# Patient Record
Sex: Male | Born: 1940 | Race: Black or African American | Hispanic: No | Marital: Married | Smoking: Never smoker
Health system: Southern US, Community
[De-identification: ages and names within clinical notes are randomized; demographics above are authoritative.]

## PROBLEM LIST (undated history)

## (undated) DIAGNOSIS — Z8709 Personal history of other diseases of the respiratory system: Secondary | ICD-10-CM

## (undated) DIAGNOSIS — I639 Cerebral infarction, unspecified: Secondary | ICD-10-CM

## (undated) DIAGNOSIS — I219 Acute myocardial infarction, unspecified: Secondary | ICD-10-CM

## (undated) DIAGNOSIS — K219 Gastro-esophageal reflux disease without esophagitis: Secondary | ICD-10-CM

## (undated) DIAGNOSIS — I10 Essential (primary) hypertension: Secondary | ICD-10-CM

## (undated) DIAGNOSIS — H269 Unspecified cataract: Secondary | ICD-10-CM

## (undated) DIAGNOSIS — E785 Hyperlipidemia, unspecified: Secondary | ICD-10-CM

## (undated) DIAGNOSIS — K649 Unspecified hemorrhoids: Secondary | ICD-10-CM

## (undated) DIAGNOSIS — M549 Dorsalgia, unspecified: Secondary | ICD-10-CM

## (undated) HISTORY — PX: SHOULDER SURGERY: SHX246

## (undated) HISTORY — PX: CARDIAC CATHETERIZATION: SHX172

## (undated) HISTORY — PX: COLONOSCOPY: SHX174

## (undated) HISTORY — PX: REFRACTIVE SURGERY: SHX103

## (undated) HISTORY — DX: Gastro-esophageal reflux disease without esophagitis: K21.9

---

## 1998-11-30 ENCOUNTER — Ambulatory Visit (HOSPITAL_COMMUNITY): Admission: RE | Admit: 1998-11-30 | Discharge: 1998-11-30 | Payer: Self-pay | Admitting: Orthopedic Surgery

## 1998-12-04 ENCOUNTER — Ambulatory Visit (HOSPITAL_COMMUNITY): Admission: RE | Admit: 1998-12-04 | Discharge: 1998-12-04 | Payer: Self-pay | Admitting: Orthopedic Surgery

## 1998-12-04 ENCOUNTER — Encounter: Payer: Self-pay | Admitting: Orthopedic Surgery

## 1999-01-21 ENCOUNTER — Ambulatory Visit (HOSPITAL_COMMUNITY): Admission: RE | Admit: 1999-01-21 | Discharge: 1999-01-21 | Payer: Self-pay | Admitting: Orthopedic Surgery

## 1999-01-21 ENCOUNTER — Encounter: Payer: Self-pay | Admitting: Orthopedic Surgery

## 1999-02-02 ENCOUNTER — Encounter: Admission: RE | Admit: 1999-02-02 | Discharge: 1999-05-03 | Payer: Self-pay | Admitting: Orthopedic Surgery

## 1999-07-23 ENCOUNTER — Inpatient Hospital Stay (HOSPITAL_COMMUNITY): Admission: EM | Admit: 1999-07-23 | Discharge: 1999-07-27 | Payer: Self-pay | Admitting: Emergency Medicine

## 1999-07-23 ENCOUNTER — Encounter: Payer: Self-pay | Admitting: Neurology

## 1999-07-24 ENCOUNTER — Encounter: Payer: Self-pay | Admitting: Neurology

## 1999-07-27 ENCOUNTER — Inpatient Hospital Stay (HOSPITAL_COMMUNITY)
Admission: RE | Admit: 1999-07-27 | Discharge: 1999-08-05 | Payer: Self-pay | Admitting: Physical Medicine & Rehabilitation

## 1999-08-10 ENCOUNTER — Encounter
Admission: RE | Admit: 1999-08-10 | Discharge: 1999-10-01 | Payer: Self-pay | Admitting: Physical Medicine & Rehabilitation

## 2000-02-10 ENCOUNTER — Encounter: Admission: RE | Admit: 2000-02-10 | Discharge: 2000-05-10 | Payer: Self-pay | Admitting: Orthopedic Surgery

## 2002-01-30 ENCOUNTER — Encounter: Payer: Self-pay | Admitting: Urology

## 2002-01-30 ENCOUNTER — Encounter: Admission: RE | Admit: 2002-01-30 | Discharge: 2002-01-30 | Payer: Self-pay | Admitting: Urology

## 2005-06-16 ENCOUNTER — Encounter: Admission: RE | Admit: 2005-06-16 | Discharge: 2005-06-16 | Payer: Self-pay | Admitting: Nephrology

## 2005-10-20 ENCOUNTER — Encounter: Admission: RE | Admit: 2005-10-20 | Discharge: 2005-10-20 | Payer: Self-pay | Admitting: Nephrology

## 2008-12-17 ENCOUNTER — Encounter (INDEPENDENT_AMBULATORY_CARE_PROVIDER_SITE_OTHER): Payer: Self-pay | Admitting: *Deleted

## 2009-02-19 ENCOUNTER — Ambulatory Visit: Payer: Self-pay | Admitting: Gastroenterology

## 2009-03-04 ENCOUNTER — Ambulatory Visit: Payer: Self-pay | Admitting: Gastroenterology

## 2009-04-15 ENCOUNTER — Encounter: Admission: RE | Admit: 2009-04-15 | Discharge: 2009-04-15 | Payer: Self-pay | Admitting: Nephrology

## 2010-09-24 LAB — GLUCOSE, CAPILLARY
Glucose-Capillary: 81 mg/dL (ref 70–99)
Glucose-Capillary: 98 mg/dL (ref 70–99)

## 2010-11-05 NOTE — Procedures (Signed)
[place]. [facility]  Patient:    Steve Martinez                           MRN: [id number] Proc. Date: 07/24/99 Adm. Date:  [id number] Attending:  Candise Che D                           Procedure Report  PROCEDURE:  Arterial line placement.  INDICATION:  Blood pressure monitoring.  Intracranial hemorrhage.  SITE:  Left radial artery.  TECHNIQUE:  Sterile modified Seldinger.  ANESTHESIA:  Lidocaine 1%.  COMPLICATIONS:  The patient tolerated the procedure well without any complications. DD:  07/24/99 TD:  07/25/99 Job: [id number]

## 2010-11-05 NOTE — Discharge Summary (Signed)
[place]. [facility]  Patient:    Steve Martinez                           MRN: [id number] Adm. Date:  [id number] Disc. Date: 07/27/99 Attending:  Blima Ledger CC:         Melburn Hake, M.D.                           Discharge Summary  DIAGNOSES: 1. Hypertensive intracranial hemorrhage right thalamus. 2. Hypertension. 3. Diabetes mellitus. 4. Coronary artery disease. 5. Ischemic stroke by history.  PROCEDURES: 1. CT scan of the head. 2. Arterial line placement.  HISTORY OF PRESENT ILLNESS:  The patient is a 70 year old man who was brought by EMS emergently to [facility] ER after sudden onset of developing the night of admission around 10:30 p.m. slurred speech, left-sided weakness involving the face, arm, and leg.  The patient was fully awake and oriented.  Denied any headache, nausea, vomiting.  His examination upon admission showed an awake and alert patient, oriented, with dysarthric speech, facial weakness on the left side, central type, and a left hemiparesis.  Strength 3/5.  The patient underwent an urgent CT scan of the head which showed a small 1.2-2.0 cm intracranial hemorrhage in the right thalamus, very likely hypertensive in nature.  HOSPITAL COURSE:  He was subsequently admitted to the neurology intensive care nit for further monitoring of neurologic status and also monitoring of his blood pressure.  For these purposes, an arterial line placement was performed to maintain systolic blood pressure less than 200.  Throughout his hospital stay, his blood  pressure ranged between [id number] systolic to the highest at [id number] systolic.  He was kept on his usual antihypertensive medications.  Other interventions included use of  Pepcid for GI protection and stocking compression devices for DVT prophylaxis.  During his hospital stay, he showed significant improvement in neurological status. His speech improved, he was able to swallow with  dysphagia III diet with chopped meats.  His strength in the left side of his body improved also to 4/5.  The patient was evaluated by speech therapy, physical therapy, and occupational therapy, and inpatient rehabilitation services was recommended.  DISCHARGE MEDICATIONS: 1. Cartia XT 300 q.d. 2. Glucotrol 10 mg b.i.d. 3. Ibuprofen 800 mg p.r.n. 4. ______ 400 mg twice a day. 5. Pravachol 10 mg once a day. 6. Prinzide 20/25 q.d. 7. Testred 1 capsule once a day. 8. Avandia 4 mg twice a day.  The patients aspirin was held, and will be restarted, hopefully, in two weeks after his onset of hemorrhage for stroke prevention. DD:  07/27/99 TD:  07/27/99 Job: [id number]

## 2010-11-05 NOTE — Discharge Summary (Signed)
[place]. [facility]  Patient:    Steve Martinez, Steve Martinez                          MRN: [id number] Dictator:   Candise Che, M.D.                           Discharge Summary  CHIEF COMPLAINT:  Left-sided weakness.  HISTORY OF PRESENT ILLNESS:  The patient is a 70 year old man who was emergently brought by EMS after sudden onset of developing at 10:30 p.m. per his wife while he was in bed left-sided weakness involving the face, arms, and legs associated with numbness of the face and left upper extremity as well.  He denies headache, no nausea and vomiting, no loss of consciousness.  He had prior history of at least two strokes, last six years ago, which left him with a mild right hemiparesis although this has completely resolved at this time.  He is currently taking aspirin for secondary stroke prevention.  PAST MEDICAL HISTORY:  Hypertension, non-insulin dependent diabetes, stroke, coronary artery disease status post angioplasty and stenting.  MEDICATIONS: 1.  Aspirin 325 mg q.d. 2.  Cartia XT 300 mg q.d. 3.  Glucotrol 10 mg b.i.d. 4.  Ibuprofen 800 mg p.r.n. 5.  Etodolac 400 mls b.i.d. 6.  Pravachol 10 mg q.d. 7.  Prinzide 20/25 q.d. 8.  Testred 1 capsule q.d. 9.  Avandia 4 mg b.i.d.  ALLERGIES:  No known drug allergies.  PRIMARY CARE PHYSICIAN:  Melburn Hake, M.D. [phone]).  SOCIAL HISTORY:  Lives at home with wife.  Nonsmoker, nondrinker.  He works Designer, television/film set for [employer].  REVIEW OF SYSTEMS:  Per History of Present Illness.  He denies headaches, no visual changes, no chest pain or shortness of breath, no fever, no abdominal pain.  PHYSICAL EXAMINATION:  VITAL SIGNS:  Blood pressure on admission 149/76, pulse 76, respirations 16, afebrile.  GENERAL:  The patient is lying on a stretcher, in no distress.  HEENT:  Head normocephalic, atraumatic.  NECK:  Supple.  No bruits.  LUNGS:  Clear bilaterally.  HEART:  Heart sounds regular  rhythm without murmurs.  ABDOMEN:  Soft, bowel sounds present, no hepatosplenomegaly.  EXTREMITIES:  No cyanosis or edema.  NEUROLOGIC:  He is awake and alert.  Speech is dysarthric.  His memory and language are normal.  Pupils equal and reactive bilaterally.  Extraocular cephalic movements intact.  Face asymmetric with left central facial palsy.  Tongue midline. Palate elevates symmetrically.  Motor examination displays a left hemiparesis.  Strength 3 to 4/5 involving equally the arm and the leg.  DTRs depressed throughout.  Plantars downgoing. Coordination impaired in left upper and left lower extremity on finger-to-nose and heel-to-shin maneuvers.  Gait was not evaluated at this time.  NEURO IMAGING:  I have personally reviewed CT scan of the patients brain, which  shows a small 1.5 to 2 cm right thalamic hemorrhage right next to the posterior  limb of the capsule without mass effect or edema.  No hydrocephalus.  IMPRESSION: 1.  Intracranial hemorrhage, right thalamus, likely hypertensive in origin. 2.  Hypertension. 3.  Diabetes mellitus. 4.  Coronary artery disease. 5.  Ischemic stroke by history.  PLAN:  Plan, recommendations, diagnosis, condition, and further interventions were discussed at length with the patient and his wife at the bedside.  The patient ill be admitted to the neuro intensive care unit  for close monitoring of his neurologic status and monitoring of blood pressure as well.  For these purposes an arterial line will be placed to monitor blood pressure directly and accurately and treated as indicated to maintain systolic blood pressure less than 200.  Will start the  patient on Pepcid for GI protection and keep him NPO until he is evaluated by the speech therapist.  Would expect that the patient will need physical therapy, occupational therapy, and eventually rehabilitation as well.  Further hemodynamic support will be provided with IV fluids,  normal saline 100 ccs an hour.  Lower extremity stocking compression devices will be used for deep vein thrombosis prophylaxis.  Dr. Grant Fontana was notified of the patients admission.DD:  07/24/99 TD:  07/24/99 Job: [id number]

## 2012-01-30 ENCOUNTER — Other Ambulatory Visit: Payer: Self-pay | Admitting: Ophthalmology

## 2012-01-30 NOTE — H&P (Signed)
  Pre-operative History and Physical for Ophthalmic Surgery  Steve Martinez 01/30/2012                  Chief Complaint: Decreased vision Right Eye  Diagnosis: Cataract Right Eye No Known Allergies  Medications: ASA 81mg  po QD Triamterene/HCTZ  37.7/25 po QD Quinapril  5mg  po QD Diltiazem HCL 240mg  po QD Simvastatin 10mg  po QD Lantus 100U/ml  15U QAM, 20 U QPM  Planned Procedure:                                       Phacoemulsification, Posterior Chamber Intra-ocular Lens  Right Eye                                      Acrysof MA50BM + 20.00 Diopter PC IOL of rimplant OD   Pulse: 70         Temp: NA        Resp:  16     ROS: blurred vision, low back ache  DM for several years  History   Social History  . Marital Status: Married    Spouse Name: N/A    Number of Children: N/A  . Years of Education: N/A   Occupational History  . Not on file.   Social History Main Topics  . Smoking status: Not on file  . Smokeless tobacco: Not on file  . Alcohol Use: Not on file  . Drug Use: Not on file  . Sexually Active: Not on file   Other Topics Concern  . Not on file   Social History Narrative  . No narrative on file     The following examination is for anesthesia clearance for minimally invasive Ophthalmic surgery. It is primarily to document heart and lung findings and is not intended to elucidate unknown general medical conditions inclusive of abdominal masses, lung lesions, etc.   General Constitution:  within normal limit   Alertness/Orientation:  Person, time place     yes   HEENT:  Eye Findings: Catraact right eye                   right eye  Neck: supple without masses  Chest/Lungs: clear to auscultation  Cardiac: Normal S1 and S2 without Murmur, S3 or S4  Neuro: non-focal   Impression: Nuclear Sclerotic Cataract  Planned Procedure:  Phacoemulsification, Posterior Chamber Intraocular Lens  Right Steve Fairly, MD

## 2012-01-31 ENCOUNTER — Encounter (HOSPITAL_COMMUNITY): Payer: Self-pay | Admitting: Pharmacy Technician

## 2012-02-03 ENCOUNTER — Encounter (HOSPITAL_COMMUNITY)
Admission: RE | Admit: 2012-02-03 | Discharge: 2012-02-03 | Disposition: A | Discharge status: Home / Self Care | Payer: No Typology Code available for payment source | Source: Ambulatory Visit | Attending: Ophthalmology | Admitting: Ophthalmology

## 2012-02-03 ENCOUNTER — Encounter (HOSPITAL_COMMUNITY): Payer: Self-pay

## 2012-02-03 HISTORY — DX: Hyperlipidemia, unspecified: E78.5

## 2012-02-03 HISTORY — DX: Personal history of other diseases of the respiratory system: Z87.09

## 2012-02-03 HISTORY — DX: Unspecified hemorrhoids: K64.9

## 2012-02-03 HISTORY — DX: Cerebral infarction, unspecified: I63.9

## 2012-02-03 HISTORY — DX: Dorsalgia, unspecified: M54.9

## 2012-02-03 HISTORY — DX: Essential (primary) hypertension: I10

## 2012-02-03 HISTORY — DX: Unspecified cataract: H26.9

## 2012-02-03 HISTORY — DX: Acute myocardial infarction, unspecified: I21.9

## 2012-02-03 LAB — BASIC METABOLIC PANEL
BUN: 30 mg/dL — ABNORMAL HIGH (ref 6–23)
CO2: 26 mEq/L (ref 19–32)
Chloride: 101 mEq/L (ref 96–112)
GFR calc Af Amer: 43 mL/min — ABNORMAL LOW (ref >90)
Glucose, Bld: 211 mg/dL — ABNORMAL HIGH (ref 70–99)
Potassium: 4.7 mEq/L (ref 3.5–5.1)

## 2012-02-03 LAB — CBC
HCT: 48 % (ref 39.0–52.0)
Hemoglobin: 15.7 g/dL (ref 13.0–17.0)
MCHC: 32.7 g/dL (ref 30.0–36.0)
MCV: 84.1 fL (ref 78.0–100.0)

## 2012-02-03 NOTE — Progress Notes (Signed)
Average fasting blood sugar around 90-95

## 2012-02-03 NOTE — Progress Notes (Addendum)
Pt doesn't have a cardiologist  Heart cath done 17-9yrs ago Echo and stress test done about 17-35yrs ago  Medical Md is Dr.Frazier  Denies recent cxr and ekg a few months ago-to request from [place]

## 2012-02-03 NOTE — Pre-Procedure Instructions (Signed)
[place]  02/03/2012   Your procedure is scheduled on:  Wed, Aug 21 @ 9:55 AM  Report to [place] at 7:45 AM.  Call this number if you have problems the morning of surgery: [phone]   Remember:   Do not eat food:After Midnight.    Take these medicines the morning of surgery with A SIP OF WATER: Diltiazem(Dilacor)   Do not wear jewelry  Do not wear lotions, powders, or colognes  Men may shave face and neck.  Do not bring valuables to the hospital.  Contacts, dentures or bridgework may not be worn into surgery.  Leave suitcase in the car. After surgery it may be brought to your room.  For patients admitted to the hospital, checkout time is 11:00 AM the day of discharge.   Patients discharged the day of surgery will not be allowed to drive home.  Special Instructions: CHG Shower Use Special Wash: 1/2 bottle night before surgery and 1/2 bottle morning of surgery.   Please read over the following fact sheets that you were given: Pain Booklet, Coughing and Deep Breathing and Surgical Site Infection Prevention

## 2012-02-06 NOTE — Consult Note (Signed)
Anesthesiology chart review:  Mr. Steve Martinez is a 71 year old male who is having placement of a right intraocular lens and phacoemulsification of the posterior chamber by Dr. Anderson Malta on 02/08/2012. His preoperative lab studies were notable for a creatinine of 1.75. He has a history of diabetes and hypertension and had a previous mild cerebral hemorrhage in the past. There is no baseline studies to compare to determine the nature of the procedure of note is acceptable to proceed with the procedure as planned. Followup his renal function postoperatively.  Roberts Gaudy, M.D.

## 2012-02-06 NOTE — Progress Notes (Signed)
Anesthesia to review labs.

## 2012-02-07 MED ORDER — TETRACAINE HCL 0.5 % OP SOLN
2.0000 [drp] | OPHTHALMIC | Status: AC
  Administered 2012-02-08: 2 [drp] via OPHTHALMIC
  Filled 2012-02-07: qty 2

## 2012-02-07 MED ORDER — PREDNISOLONE ACETATE 1 % OP SUSP
1.0000 [drp] | OPHTHALMIC | Status: AC
  Administered 2012-02-08: 1 [drp] via OPHTHALMIC
  Filled 2012-02-07 (×2): qty 5

## 2012-02-07 MED ORDER — GATIFLOXACIN 0.5 % OP SOLN
1.0000 [drp] | OPHTHALMIC | Status: AC | PRN
  Administered 2012-02-08 (×3): 1 [drp] via OPHTHALMIC
  Filled 2012-02-07: qty 2.5

## 2012-02-07 MED ORDER — PHENYLEPHRINE HCL 2.5 % OP SOLN
1.0000 [drp] | OPHTHALMIC | Status: AC | PRN
  Administered 2012-02-08 (×3): 1 [drp] via OPHTHALMIC
  Filled 2012-02-07: qty 3

## 2012-02-07 NOTE — Progress Notes (Signed)
Spoke to pt, notified of surgery time change and new arrival time of 44.

## 2012-02-08 ENCOUNTER — Ambulatory Visit (HOSPITAL_COMMUNITY): Payer: No Typology Code available for payment source | Admitting: Anesthesiology

## 2012-02-08 ENCOUNTER — Ambulatory Visit (HOSPITAL_COMMUNITY)
Admission: RE | Admit: 2012-02-08 | Discharge: 2012-02-08 | Disposition: A | Discharge status: Home / Self Care | Payer: No Typology Code available for payment source | Source: Ambulatory Visit | Attending: Ophthalmology | Admitting: Ophthalmology

## 2012-02-08 ENCOUNTER — Encounter (HOSPITAL_COMMUNITY): Payer: Self-pay | Admitting: Anesthesiology

## 2012-02-08 ENCOUNTER — Encounter (HOSPITAL_COMMUNITY)
Admission: RE | Disposition: A | Discharge status: Home / Self Care | Payer: Self-pay | Source: Ambulatory Visit | Attending: Ophthalmology

## 2012-02-08 DIAGNOSIS — I10 Essential (primary) hypertension: Secondary | ICD-10-CM | POA: Insufficient documentation

## 2012-02-08 DIAGNOSIS — H269 Unspecified cataract: Secondary | ICD-10-CM | POA: Insufficient documentation

## 2012-02-08 DIAGNOSIS — Z794 Long term (current) use of insulin: Secondary | ICD-10-CM | POA: Insufficient documentation

## 2012-02-08 DIAGNOSIS — I6990 Unspecified sequelae of unspecified cerebrovascular disease: Secondary | ICD-10-CM | POA: Insufficient documentation

## 2012-02-08 DIAGNOSIS — E119 Type 2 diabetes mellitus without complications: Secondary | ICD-10-CM | POA: Insufficient documentation

## 2012-02-08 HISTORY — PX: CATARACT EXTRACTION W/PHACO: SHX586

## 2012-02-08 LAB — GLUCOSE, CAPILLARY: Glucose-Capillary: 78 mg/dL (ref 70–99)

## 2012-02-08 SURGERY — PHACOEMULSIFICATION, CATARACT, WITH IOL INSERTION
Anesthesia: Monitor Anesthesia Care | Site: Eye | Laterality: Right | Wound class: Clean

## 2012-02-08 MED ORDER — CEFAZOLIN SUBCONJUNCTIVAL INJECTION 100 MG/0.5 ML
200.0000 mg | INJECTION | SUBCONJUNCTIVAL | Status: DC
  Filled 2012-02-08: qty 1

## 2012-02-08 MED ORDER — LIDOCAINE HCL (PF) 2 % IJ SOLN
INTRAMUSCULAR | Status: DC | PRN
  Administered 2012-02-08: 20 mL

## 2012-02-08 MED ORDER — BACITRACIN-POLYMYXIN B 500-10000 UNIT/GM OP OINT
TOPICAL_OINTMENT | OPHTHALMIC | Status: DC | PRN
  Administered 2012-02-08: 1 via OPHTHALMIC

## 2012-02-08 MED ORDER — PROVISC 10 MG/ML IO SOLN
INTRAOCULAR | Status: DC | PRN
  Administered 2012-02-08: .85 mL via INTRAOCULAR

## 2012-02-08 MED ORDER — WATER FOR IRRIGATION, STERILE IR SOLN
Status: DC | PRN
  Administered 2012-02-08: 1000 mL via OPHTHALMIC

## 2012-02-08 MED ORDER — ACETYLCHOLINE CHLORIDE 1:100 IO SOLR
INTRAOCULAR | Status: DC | PRN
  Administered 2012-02-08: 20 mg via INTRAOCULAR

## 2012-02-08 MED ORDER — DEXAMETHASONE SODIUM PHOSPHATE 10 MG/ML IJ SOLN
INTRAMUSCULAR | Status: AC
  Filled 2012-02-08: qty 1

## 2012-02-08 MED ORDER — EPINEPHRINE HCL 1 MG/ML IJ SOLN
INTRAOCULAR | Status: DC | PRN
  Administered 2012-02-08: 10:00:00

## 2012-02-08 MED ORDER — BUPIVACAINE HCL 0.75 % IJ SOLN
INTRAMUSCULAR | Status: DC | PRN
  Administered 2012-02-08: 10 mL

## 2012-02-08 MED ORDER — SODIUM CHLORIDE 0.9 % IV SOLN
INTRAVENOUS | Status: DC
  Administered 2012-02-08: 09:00:00 via INTRAVENOUS

## 2012-02-08 MED ORDER — NA CHONDROIT SULF-NA HYALURON 40-30 MG/ML IO SOLN
INTRAOCULAR | Status: DC | PRN
  Administered 2012-02-08: 0.5 mL via INTRAOCULAR

## 2012-02-08 MED ORDER — LIDOCAINE HCL 2 % IJ SOLN
INTRAMUSCULAR | Status: AC
Start: 2012-02-08 — End: 2012-02-08
  Filled 2012-02-08: qty 1

## 2012-02-08 MED ORDER — EPINEPHRINE HCL 1 MG/ML IJ SOLN
INTRAMUSCULAR | Status: AC
Start: 2012-02-08 — End: 2012-02-08
  Filled 2012-02-08: qty 1

## 2012-02-08 MED ORDER — BUPIVACAINE HCL 0.75 % IJ SOLN
INTRAMUSCULAR | Status: AC
  Filled 2012-02-08: qty 10

## 2012-02-08 MED ORDER — 0.9 % SODIUM CHLORIDE (POUR BTL) OPTIME
TOPICAL | Status: DC | PRN
  Administered 2012-02-08: 1000 mL

## 2012-02-08 MED ORDER — ACETYLCHOLINE CHLORIDE 1:100 IO SOLR
INTRAOCULAR | Status: AC
  Filled 2012-02-08: qty 1

## 2012-02-08 MED ORDER — NA CHONDROIT SULF-NA HYALURON 40-30 MG/ML IO SOLN
INTRAOCULAR | Status: AC
  Filled 2012-02-08: qty 0.5

## 2012-02-08 MED ORDER — BACITRACIN-POLYMYXIN B 500-10000 UNIT/GM OP OINT
TOPICAL_OINTMENT | OPHTHALMIC | Status: AC
  Filled 2012-02-08: qty 3.5

## 2012-02-08 MED ORDER — HYPROMELLOSE (GONIOSCOPIC) 2.5 % OP SOLN
OPHTHALMIC | Status: AC
  Filled 2012-02-08: qty 15

## 2012-02-08 MED ORDER — HYPROMELLOSE (GONIOSCOPIC) 2.5 % OP SOLN
OPHTHALMIC | Status: DC | PRN
  Administered 2012-02-08: 2 [drp] via OPHTHALMIC

## 2012-02-08 MED ORDER — BSS IO SOLN
INTRAOCULAR | Status: AC
  Filled 2012-02-08: qty 500

## 2012-02-08 MED ORDER — SODIUM CHLORIDE 0.9 % IV SOLN
INTRAVENOUS | Status: DC | PRN
  Administered 2012-02-08: 10:00:00 via INTRAVENOUS

## 2012-02-08 MED ORDER — DEXAMETHASONE SODIUM PHOSPHATE 10 MG/ML IJ SOLN
INTRAMUSCULAR | Status: DC | PRN
  Administered 2012-02-08: 10 mg via INTRAVENOUS

## 2012-02-08 MED ORDER — PROPOFOL 10 MG/ML IV EMUL
INTRAVENOUS | Status: DC | PRN
  Administered 2012-02-08: 60 mg via INTRAVENOUS

## 2012-02-08 MED ORDER — LIDOCAINE HCL (CARDIAC) 20 MG/ML IV SOLN
INTRAVENOUS | Status: DC | PRN
  Administered 2012-02-08: 40 mg via INTRAVENOUS

## 2012-02-08 MED ORDER — TRIAMCINOLONE ACETONIDE 40 MG/ML IJ SUSP
INTRAMUSCULAR | Status: AC
  Filled 2012-02-08: qty 1

## 2012-02-08 MED ORDER — ACETAZOLAMIDE SODIUM 500 MG IJ SOLR
INTRAMUSCULAR | Status: AC
  Filled 2012-02-08: qty 500

## 2012-02-08 MED ORDER — CEFAZOLIN SUBCONJUNCTIVAL INJECTION 100 MG/0.5 ML
INJECTION | SUBCONJUNCTIVAL | Status: DC | PRN
  Administered 2012-02-08: 200 mg via SUBCONJUNCTIVAL

## 2012-02-08 SURGICAL SUPPLY — 68 items
APL SRG 3 HI ABS STRL LF PLS (MISCELLANEOUS) ×1
APPLICATOR COTTON TIP 6IN STRL (MISCELLANEOUS) ×2 IMPLANT
APPLICATOR DR MATTHEWS STRL (MISCELLANEOUS) ×2 IMPLANT
BAG FLD CLT MN 6.25X3.5 (WOUND CARE) ×1
BAG MINI COLL DRAIN (WOUND CARE) ×2 IMPLANT
BLADE EYE MINI 60D BEAVER (BLADE) IMPLANT
BLADE KERATOME 2.75 (BLADE) ×2 IMPLANT
BLADE STAB KNIFE 15DEG (BLADE) IMPLANT
CANNULA ANTERIOR CHAMBER 27GA (MISCELLANEOUS) IMPLANT
CLOTH BEACON ORANGE TIMEOUT ST (SAFETY) ×2 IMPLANT
DRAPE OPHTHALMIC 77X100 STRL (CUSTOM PROCEDURE TRAY) ×2 IMPLANT
DRAPE POUCH INSTRU U-SHP 10X18 (DRAPES) ×2 IMPLANT
DRSG TEGADERM 4X4.75 (GAUZE/BANDAGES/DRESSINGS) ×2 IMPLANT
FILTER BLUE MILLIPORE (MISCELLANEOUS) IMPLANT
GLOVE BIOGEL PI IND STRL 7.0 (GLOVE) IMPLANT
GLOVE BIOGEL PI INDICATOR 7.0 (GLOVE) ×1
GLOVE SS BIOGEL STRL SZ 6.5 (GLOVE) ×1 IMPLANT
GLOVE SS BIOGEL STRL SZ 7 (GLOVE) IMPLANT
GLOVE SUPERSENSE BIOGEL SZ 6.5 (GLOVE) ×1
GLOVE SUPERSENSE BIOGEL SZ 7 (GLOVE) ×2
GLOVE SURG SS PI 6.5 STRL IVOR (GLOVE) ×1 IMPLANT
GOWN SRG XL XLNG 56XLVL 4 (GOWN DISPOSABLE) ×1 IMPLANT
GOWN STRL NON-REIN LRG LVL3 (GOWN DISPOSABLE) ×3 IMPLANT
GOWN STRL NON-REIN XL XLG LVL4 (GOWN DISPOSABLE) ×2
KIT BASIN OR (CUSTOM PROCEDURE TRAY) ×2 IMPLANT
KIT ROOM TURNOVER OR (KITS) IMPLANT
KNIFE GRIESHABER SHARP 2.5MM (MISCELLANEOUS) ×2 IMPLANT
LENS INTRAOCULAR MA50BM 20.0 (Intraocular Lens) ×1 IMPLANT
MASK EYE SHIELD (GAUZE/BANDAGES/DRESSINGS) ×1 IMPLANT
NDL 18GX1X1/2 (RX/OR ONLY) (NEEDLE) IMPLANT
NDL 25GX 5/8IN NON SAFETY (NEEDLE) ×1 IMPLANT
NDL FILTER BLUNT 18X1 1/2 (NEEDLE) IMPLANT
NDL HYPO 30X.5 LL (NEEDLE) ×2 IMPLANT
NEEDLE 18GX1X1/2 (RX/OR ONLY) (NEEDLE) ×2 IMPLANT
NEEDLE 22X1 1/2 (OR ONLY) (NEEDLE) ×2 IMPLANT
NEEDLE 25GX 5/8IN NON SAFETY (NEEDLE) ×2 IMPLANT
NEEDLE FILTER BLUNT 18X 1/2SAF (NEEDLE)
NEEDLE FILTER BLUNT 18X1 1/2 (NEEDLE) IMPLANT
NEEDLE HYPO 30X.5 LL (NEEDLE) ×4 IMPLANT
NS IRRIG 1000ML POUR BTL (IV SOLUTION) ×2 IMPLANT
PACK CATARACT CUSTOM (CUSTOM PROCEDURE TRAY) ×2 IMPLANT
PACK CATARACT MCHSCP (PACKS) ×2 IMPLANT
PACK COMBINED CATERACT/VIT 23G (OPHTHALMIC RELATED) IMPLANT
PAD ARMBOARD 7.5X6 YLW CONV (MISCELLANEOUS) ×4 IMPLANT
PAD EYE OVAL STERILE LF (GAUZE/BANDAGES/DRESSINGS) ×1 IMPLANT
PHACO TIP KELMAN 45DEG (TIP) ×2 IMPLANT
PROBE ANTERIOR 20G W/INFUS NDL (MISCELLANEOUS) ×1 IMPLANT
ROLLS DENTAL (MISCELLANEOUS) IMPLANT
SHUTTLE MONARCH TYPE A (NEEDLE) ×2 IMPLANT
SOLUTION ANTI FOG 6CC (MISCELLANEOUS) ×1 IMPLANT
SPEAR EYE SURG WECK-CEL (MISCELLANEOUS) ×2 IMPLANT
SUT ETHILON 10-0 CS-B-6CS-B-6 (SUTURE)
SUT ETHILON 5 0 P 3 18 (SUTURE)
SUT ETHILON 9 0 TG140 8 (SUTURE) IMPLANT
SUT NYLON ETHILON 5-0 P-3 1X18 (SUTURE) IMPLANT
SUT PLAIN 6 0 TG1408 (SUTURE) IMPLANT
SUT POLY NON ABSORB 10-0 8 STR (SUTURE) IMPLANT
SUT VICRYL 6 0 S 29 12 (SUTURE) IMPLANT
SUTURE EHLN 10-0 CS-B-6CS-B-6 (SUTURE) IMPLANT
SYR 20CC LL (SYRINGE) IMPLANT
SYR 5ML LL (SYRINGE) IMPLANT
SYR TB 1ML LUER SLIP (SYRINGE) ×1 IMPLANT
SYRINGE 10CC LL (SYRINGE) IMPLANT
TAPE SURG TRANSPORE 1 IN (GAUZE/BANDAGES/DRESSINGS) IMPLANT
TAPE SURGICAL TRANSPORE 1 IN (GAUZE/BANDAGES/DRESSINGS) ×1
TOWEL OR 17X24 6PK STRL BLUE (TOWEL DISPOSABLE) ×4 IMPLANT
WATER STERILE IRR 1000ML POUR (IV SOLUTION) ×2 IMPLANT
WIPE INSTRUMENT VISIWIPE 73X73 (MISCELLANEOUS) ×2 IMPLANT

## 2012-02-08 NOTE — Anesthesia Preprocedure Evaluation (Addendum)
Anesthesia Evaluation  Patient identified by MRN, date of birth, ID band Patient awake    Reviewed: Allergy & Precautions, H&P , NPO status , Patient's Chart, lab work & pertinent test results  History of Anesthesia Complications Negative for: history of anesthetic complications  Airway Mallampati: II TM Distance: >3 FB Neck ROM: Full    Dental  (+) Partial Lower, Teeth Intact, Caps and Dental Advisory Given,    Pulmonary neg pulmonary ROS,          Cardiovascular hypertension, Pt. on medications + Past MI     Neuro/Psych CVA (L sided weakness), Residual Symptoms    GI/Hepatic negative GI ROS, Neg liver ROS,   Endo/Other  Well Controlled, Type 2, Insulin Dependent  Renal/GU negative Renal ROS     Musculoskeletal negative musculoskeletal ROS (+)   Abdominal   Peds  Hematology negative hematology ROS (+)   Anesthesia Other Findings   Reproductive/Obstetrics                        Anesthesia Physical Anesthesia Plan  ASA: III  Anesthesia Plan:    Post-op Pain Management:    Induction:   Airway Management Planned:   Additional Equipment:   Intra-op Plan:   Post-operative Plan:   Informed Consent:   Plan Discussed with:   Anesthesia Plan Comments:         Anesthesia Quick Evaluation

## 2012-02-08 NOTE — H&P (View-Only) (Signed)
  Pre-operative History and Physical for Ophthalmic Surgery  Steve Martinez 01/30/2012                  Chief Complaint: Decreased vision Right Eye  Diagnosis: Cataract Right Eye No Known Allergies  Medications: ASA 81mg  po QD Triamterene/HCTZ  37.7/25 po QD Quinapril  5mg  po QD Diltiazem HCL 240mg  po QD Simvastatin 10mg  po QD Lantus 100U/ml  15U QAM, 20 U QPM  Planned Procedure:                                       Phacoemulsification, Posterior Chamber Intra-ocular Lens  Right Eye                                      Acrysof MA50BM + 20.00 Diopter PC IOL of rimplant OD   Pulse: 70         Temp: NA        Resp:  16     ROS: blurred vision, low back ache  DM for several years  History   Social History  . Marital Status: Married    Spouse Name: N/A    Number of Children: N/A  . Years of Education: N/A   Occupational History  . Not on file.   Social History Main Topics  . Smoking status: Not on file  . Smokeless tobacco: Not on file  . Alcohol Use: Not on file  . Drug Use: Not on file  . Sexually Active: Not on file   Other Topics Concern  . Not on file   Social History Narrative  . No narrative on file     The following examination is for anesthesia clearance for minimally invasive Ophthalmic surgery. It is primarily to document heart and lung findings and is not intended to elucidate unknown general medical conditions inclusive of abdominal masses, lung lesions, etc.   General Constitution:  within normal limit   Alertness/Orientation:  Person, time place     yes   HEENT:  Eye Findings: Catraact right eye                   right eye  Neck: supple without masses  Chest/Lungs: clear to auscultation  Cardiac: Normal S1 and S2 without Murmur, S3 or S4  Neuro: non-focal   Impression: Nuclear Sclerotic Cataract  Planned Procedure:  Phacoemulsification, Posterior Chamber Intraocular Lens  Right [name], MD

## 2012-02-08 NOTE — Transfer of Care (Signed)
Immediate Anesthesia Transfer of Care Note  Patient: Steve Martinez  Procedure(s) Performed: Procedure(s) (LRB): CATARACT EXTRACTION PHACO AND INTRAOCULAR LENS PLACEMENT (IOC) (Right)  Patient Location: Short Stay  Anesthesia Type: MAC  Level of Consciousness: awake, alert , oriented and patient cooperative  Airway & Oxygen Therapy: Patient Spontanous Breathing  Post-op Assessment: Report given to PACU RN, Post -op Vital signs reviewed and stable and Patient moving all extremities  Post vital signs: Reviewed and stable  Complications: No apparent anesthesia complications

## 2012-02-08 NOTE — Op Note (Signed)
Steve Martinez 02/08/2012 Cataract   Procedure: Phacoemulsification, Posterior Chamber Intra-ocular Lens Operative Eye:  left eye  Surgeon: Adonis Brook Estimated Blood Loss: minimal Specimens for Pathology:  None Complications: Posterior capsule tear  The patient was prepared and draped in the usual manner for ocular surgery on the right eye. A Cook lid speculum was placed. A peripheral clear corneal incision was made at the surgical limbus centered at the 11:00 meridian. A separate clear corneal stab incision was made with a 15 degree blade at the 2:00 meridian to permit bi-manual technique. Provisc was instilled into the anterior chamber through that incision.  A keratome was used to create a self sealing incision entering the anterior chamber at the 11:00 meridian. A capsulorhexis was performed using a bent 25g needle. The lens was hydrodissected and the nucleus was hydrodilineated using a Nichammin cannula. The Chang chopper was inserted and used to rotate the lens to insure adequate lens mobility. The phacoemulsification handpiece was inserted and a combined phaco-chop technique was employed, fracturing the lens into separate sections with subsequent removal with the phaco handpiece. At the beginning of cortex removal a radial tear formed at the 4:30 meridian and progressed to the posterior capsule. The anterior vitrectomy handpiece was used to remove core vitreous and to reduce posterior capsule which still had adherent cortex. After clearing the anterior chamber of vitreous and removing as much posterior capsule as was feasible safely, Provisc was placed in the anterior chamber and the PC IOL was placed in the sulcus oriented vertically. The vitrector was again used to insure there was no vitreous in the [facility]. Miochol was then placed to bring the pupil down. The Monarch injector was used to place a folded Acrysof MA50BM PC IOL, + 20.00  diopters, into the capsule bag. A McPherson forcep was used to  place the trailing haptic in the sulcus.  The I/A cannula was used to remove the viscoelastic from the anterior chamber. BSS was used to bring IOP to the desired range and the wound was checked to insure it was watertight. Subconjunctival injections of Ancef 100/0.81ml and Dexamethasone 4mg /6ml were placed without complication. The lid speculum and drapes were removed and the patient's eye was patched with Polymixin/Bacitracin ophthalmic ointment. An eye shield was placed and the patient was transferred alert and conversant from the operating room to the post-operative recovery area.   Steve Martinez, Steve Martinez

## 2012-02-08 NOTE — Anesthesia Postprocedure Evaluation (Signed)
Anesthesia Post Note  Patient: Steve Martinez  Procedure(s) Performed: Procedure(s) (LRB): CATARACT EXTRACTION PHACO AND INTRAOCULAR LENS PLACEMENT (IOC) (Right)  Anesthesia type: MAC  Patient location: PACU  Post pain: Pain level controlled  Post assessment: Patient's Cardiovascular Status Stable  Last Vitals:  Filed Vitals:   02/08/12 1123  BP: 153/73  Pulse: 68  Temp: 36.6 C  Resp: 16    Post vital signs: Reviewed and stable  Level of consciousness: sedated  Complications: No apparent anesthesia complications

## 2012-02-08 NOTE — Progress Notes (Signed)
CBG noted at 78. Patient drinking regular coke and ate crackers and peanut butter prior to leaving post op. Patient has been asymptomatic of any hypoglycemia. Postop teaching included monitoring blood sugar and eating balanced diet today.

## 2012-02-08 NOTE — Addendum Note (Signed)
Addendum  created 02/08/12 1207 by Noah Charon, CRNA   Modules edited:Anesthesia Medication Administration

## 2012-02-08 NOTE — Interval H&P Note (Signed)
History and Physical Interval Note:  02/08/2012 9:33 AM  Steve Martinez  has presented today for surgery, with the diagnosis of Cataract Right Eye  The various methods of treatment have been discussed with the patient and family. After consideration of risks, benefits and other options for treatment, the patient has consented to  Procedure(s) (LRB): CATARACT EXTRACTION PHACO AND INTRAOCULAR LENS PLACEMENT ([place]) (Right) as a surgical intervention .  The patient's history has been reviewed, patient examined, no change in status, stable for surgery.  I have reviewed the patient's chart and labs.  Questions were answered to the patient's satisfaction.     Adonis Brook, MD

## 2012-02-09 ENCOUNTER — Encounter (HOSPITAL_COMMUNITY): Payer: Self-pay | Admitting: Ophthalmology

## 2014-06-23 DIAGNOSIS — E11351 Type 2 diabetes mellitus with proliferative diabetic retinopathy with macular edema: Secondary | ICD-10-CM | POA: Diagnosis not present

## 2014-06-23 DIAGNOSIS — E11339 Type 2 diabetes mellitus with moderate nonproliferative diabetic retinopathy without macular edema: Secondary | ICD-10-CM | POA: Diagnosis not present

## 2014-06-23 DIAGNOSIS — H43813 Vitreous degeneration, bilateral: Secondary | ICD-10-CM | POA: Diagnosis not present

## 2014-07-16 DIAGNOSIS — E11329 Type 2 diabetes mellitus with mild nonproliferative diabetic retinopathy without macular edema: Secondary | ICD-10-CM | POA: Diagnosis not present

## 2014-07-16 DIAGNOSIS — N183 Chronic kidney disease, stage 3 (moderate): Secondary | ICD-10-CM | POA: Diagnosis not present

## 2014-07-16 DIAGNOSIS — I639 Cerebral infarction, unspecified: Secondary | ICD-10-CM | POA: Diagnosis not present

## 2014-07-16 DIAGNOSIS — E780 Pure hypercholesterolemia: Secondary | ICD-10-CM | POA: Diagnosis not present

## 2014-07-16 DIAGNOSIS — E1142 Type 2 diabetes mellitus with diabetic polyneuropathy: Secondary | ICD-10-CM | POA: Diagnosis not present

## 2014-07-16 DIAGNOSIS — E1122 Type 2 diabetes mellitus with diabetic chronic kidney disease: Secondary | ICD-10-CM | POA: Diagnosis not present

## 2014-07-16 DIAGNOSIS — I129 Hypertensive chronic kidney disease with stage 1 through stage 4 chronic kidney disease, or unspecified chronic kidney disease: Secondary | ICD-10-CM | POA: Diagnosis not present

## 2014-08-06 DIAGNOSIS — H43811 Vitreous degeneration, right eye: Secondary | ICD-10-CM | POA: Diagnosis not present

## 2014-08-06 DIAGNOSIS — E11351 Type 2 diabetes mellitus with proliferative diabetic retinopathy with macular edema: Secondary | ICD-10-CM | POA: Diagnosis not present

## 2014-08-06 DIAGNOSIS — E11339 Type 2 diabetes mellitus with moderate nonproliferative diabetic retinopathy without macular edema: Secondary | ICD-10-CM | POA: Diagnosis not present

## 2014-08-12 DIAGNOSIS — Z794 Long term (current) use of insulin: Secondary | ICD-10-CM | POA: Diagnosis not present

## 2014-08-12 DIAGNOSIS — E1142 Type 2 diabetes mellitus with diabetic polyneuropathy: Secondary | ICD-10-CM | POA: Diagnosis not present

## 2014-08-12 DIAGNOSIS — E11329 Type 2 diabetes mellitus with mild nonproliferative diabetic retinopathy without macular edema: Secondary | ICD-10-CM | POA: Diagnosis not present

## 2014-08-12 DIAGNOSIS — E1122 Type 2 diabetes mellitus with diabetic chronic kidney disease: Secondary | ICD-10-CM | POA: Diagnosis not present

## 2014-08-12 DIAGNOSIS — N183 Chronic kidney disease, stage 3 (moderate): Secondary | ICD-10-CM | POA: Diagnosis not present

## 2014-08-20 DIAGNOSIS — E11351 Type 2 diabetes mellitus with proliferative diabetic retinopathy with macular edema: Secondary | ICD-10-CM | POA: Diagnosis not present

## 2014-11-11 DIAGNOSIS — E1122 Type 2 diabetes mellitus with diabetic chronic kidney disease: Secondary | ICD-10-CM | POA: Diagnosis not present

## 2014-11-11 DIAGNOSIS — N183 Chronic kidney disease, stage 3 (moderate): Secondary | ICD-10-CM | POA: Diagnosis not present

## 2014-11-11 DIAGNOSIS — E11329 Type 2 diabetes mellitus with mild nonproliferative diabetic retinopathy without macular edema: Secondary | ICD-10-CM | POA: Diagnosis not present

## 2014-11-11 DIAGNOSIS — Z794 Long term (current) use of insulin: Secondary | ICD-10-CM | POA: Diagnosis not present

## 2014-11-11 DIAGNOSIS — E1142 Type 2 diabetes mellitus with diabetic polyneuropathy: Secondary | ICD-10-CM | POA: Diagnosis not present

## 2014-12-17 DIAGNOSIS — L02619 Cutaneous abscess of unspecified foot: Secondary | ICD-10-CM | POA: Diagnosis not present

## 2014-12-19 ENCOUNTER — Encounter: Payer: Self-pay | Admitting: Podiatry

## 2014-12-19 ENCOUNTER — Ambulatory Visit (INDEPENDENT_AMBULATORY_CARE_PROVIDER_SITE_OTHER): Payer: Commercial Managed Care - HMO | Admitting: Podiatry

## 2014-12-19 ENCOUNTER — Ambulatory Visit (INDEPENDENT_AMBULATORY_CARE_PROVIDER_SITE_OTHER): Payer: Commercial Managed Care - HMO

## 2014-12-19 ENCOUNTER — Ambulatory Visit: Payer: Commercial Managed Care - HMO

## 2014-12-19 VITALS — BP 126/71 | HR 84 | Temp 98.0°F | Resp 18

## 2014-12-19 DIAGNOSIS — L97511 Non-pressure chronic ulcer of other part of right foot limited to breakdown of skin: Secondary | ICD-10-CM

## 2014-12-19 DIAGNOSIS — Z189 Retained foreign body fragments, unspecified material: Secondary | ICD-10-CM

## 2014-12-19 DIAGNOSIS — R52 Pain, unspecified: Secondary | ICD-10-CM | POA: Diagnosis not present

## 2014-12-19 NOTE — Patient Instructions (Signed)
Finish antibiotics  Monitor for any signs/symptoms of infection. Call the office immediately if any occur or go directly to the emergency room. Call with any questions/concerns.

## 2014-12-19 NOTE — Progress Notes (Signed)
   Subjective:    Patient ID: Steve Martinez, male    DOB: 09-01-1940, 74 y.o.   MRN: [id number]  HPI  74 year old male presents the office today with complaints of pain to the ball of his right foot. He states his been ongoing for approximately 3 weeks has been progressive. He states he has pain to the area between with pressure in shoe gear. He does also states that he dropped a coffee jar and he may have stepped on glass although he is unsure. Over the ball the right foot there is a callus he states that he has had some pus coming from the area. He did go to his primary care physician and his placed on Bactrim. Denies any swelling or redness to the area. No other complaints at this time. He is diabetic and since his last blood sugar was 131 today.  Review of Systems  All other systems reviewed and are negative.      Objective:   Physical Exam AAO 3, NAD DP/PT pulses decreased bilaterally, CRT less than 3 seconds Protective sensation decreased Simms Weinstein monofilament, Achilles tendon reflex intact On the plantar aspect of the right foot submetatarsal 5 hyperkeratotic amputation. Upon debridement lesion there was a piece of glass which was removed. Also upon debridement there is underlying ulceration measuring about 0.5 x 0.5 cm with a granular wound base. There wound does not probe and there is no undermining or tunneling. There is a no surrounding erythema or ascending cellulitis. No purulence is expressible this time. No other open lesions or pre-ulcer lesions identified bilaterally No pain with calf compression, swelling, warmth, erythema      Assessment & Plan:  74 year old male right foot ulceration, retained foreign body -X-rays were obtained and reviewed with the patient. There was a foreign body present submetatarsal 5 area. X-rays were also taken after the debridement and the glass removal to ensure that the foreign body removed which had been. -Treatment options discussed  including all alternatives, risks, and complications -Lesion sharply debrided. A piece of glass was removed. Underlying ulceration was debrided to healthy, bleeding, granular wound base. Silvadene was applied followed by dry sterile dressing. Continue daily dressing changes at home with and a lot of ointment and a bandage. Finish course of antibiotic. Monitor for any clinical signs or symptoms of infection and directed to call the office immediately should any occur or go to the ER. -Follow-up 2 weeks or sooner if any problems arise. In the meantime, encouraged to call the office with any questions, concerns, change in symptoms.   Celesta Gentile, DPM

## 2014-12-29 ENCOUNTER — Encounter: Payer: Self-pay | Admitting: Podiatry

## 2014-12-31 DIAGNOSIS — E11339 Type 2 diabetes mellitus with moderate nonproliferative diabetic retinopathy without macular edema: Secondary | ICD-10-CM | POA: Diagnosis not present

## 2014-12-31 DIAGNOSIS — H43813 Vitreous degeneration, bilateral: Secondary | ICD-10-CM | POA: Diagnosis not present

## 2014-12-31 DIAGNOSIS — E11351 Type 2 diabetes mellitus with proliferative diabetic retinopathy with macular edema: Secondary | ICD-10-CM | POA: Diagnosis not present

## 2015-01-01 ENCOUNTER — Encounter: Payer: Self-pay | Admitting: Gastroenterology

## 2015-01-02 ENCOUNTER — Ambulatory Visit (INDEPENDENT_AMBULATORY_CARE_PROVIDER_SITE_OTHER): Payer: Commercial Managed Care - HMO | Admitting: Podiatry

## 2015-01-02 ENCOUNTER — Encounter: Payer: Self-pay | Admitting: Podiatry

## 2015-01-02 VITALS — BP 135/72 | HR 79 | Resp 18

## 2015-01-02 DIAGNOSIS — Z189 Retained foreign body fragments, unspecified material: Secondary | ICD-10-CM | POA: Diagnosis not present

## 2015-01-02 DIAGNOSIS — L97511 Non-pressure chronic ulcer of other part of right foot limited to breakdown of skin: Secondary | ICD-10-CM | POA: Diagnosis not present

## 2015-01-05 ENCOUNTER — Encounter: Payer: Self-pay | Admitting: Podiatry

## 2015-01-05 NOTE — Progress Notes (Signed)
Patient ID: Steve Martinez, male   DOB: [birth year], 74 y.o.   MRN: [id number]  Subjective: 74 year old male presents the office they follow up evaluation of right foot ulceration, foreign body removal. He states the last appointment he has been doing well and he no longer has any pain to the part of his foot. He denies any surrounding redness or drainage. Has continue on the antibiotic's. He denies any systemic complaints as fevers, chills, nausea, vomiting.  Objective: AAO x3, NAD Neurovascular status unchanged. Hyperkeratotic lesion overlying the right submetatarsal 5. Upon debridement the underlying ulceration appears to be healed. There is no opening in the skin. There is no surrounding erythema, ascending cellulitis, fluctuance, crepitus, malodor. There is no drainage or purulence. No other open lesions or pre-ulcerative lesions identified bilaterally. There is no pain with calf compression, swelling, warmth, erythema.  Assessment: 74 year old male with healed right submetatarsal 5 ulceration  Plan: Hyperkeratotic lesion sharply debrided without consultation/lesion. Underlying ulceration appears to be healed. Continue to monitor for any reoccurrence. Follow-up as needed. The meantime I encouraged him to call the office with any questions, concerns, changes symptoms.  Celesta Gentile, DPM

## 2015-01-14 DIAGNOSIS — E1142 Type 2 diabetes mellitus with diabetic polyneuropathy: Secondary | ICD-10-CM | POA: Diagnosis not present

## 2015-01-14 DIAGNOSIS — E780 Pure hypercholesterolemia: Secondary | ICD-10-CM | POA: Diagnosis not present

## 2015-01-14 DIAGNOSIS — N183 Chronic kidney disease, stage 3 (moderate): Secondary | ICD-10-CM | POA: Diagnosis not present

## 2015-01-14 DIAGNOSIS — E11329 Type 2 diabetes mellitus with mild nonproliferative diabetic retinopathy without macular edema: Secondary | ICD-10-CM | POA: Diagnosis not present

## 2015-01-14 DIAGNOSIS — I129 Hypertensive chronic kidney disease with stage 1 through stage 4 chronic kidney disease, or unspecified chronic kidney disease: Secondary | ICD-10-CM | POA: Diagnosis not present

## 2015-01-14 DIAGNOSIS — E1122 Type 2 diabetes mellitus with diabetic chronic kidney disease: Secondary | ICD-10-CM | POA: Diagnosis not present

## 2015-01-14 DIAGNOSIS — I639 Cerebral infarction, unspecified: Secondary | ICD-10-CM | POA: Diagnosis not present

## 2015-05-06 DIAGNOSIS — E113511 Type 2 diabetes mellitus with proliferative diabetic retinopathy with macular edema, right eye: Secondary | ICD-10-CM | POA: Diagnosis not present

## 2015-05-06 DIAGNOSIS — E113392 Type 2 diabetes mellitus with moderate nonproliferative diabetic retinopathy without macular edema, left eye: Secondary | ICD-10-CM | POA: Diagnosis not present

## 2015-05-06 DIAGNOSIS — H43813 Vitreous degeneration, bilateral: Secondary | ICD-10-CM | POA: Diagnosis not present

## 2015-05-27 DIAGNOSIS — E113299 Type 2 diabetes mellitus with mild nonproliferative diabetic retinopathy without macular edema, unspecified eye: Secondary | ICD-10-CM | POA: Diagnosis not present

## 2015-05-27 DIAGNOSIS — I10 Essential (primary) hypertension: Secondary | ICD-10-CM | POA: Diagnosis not present

## 2015-05-27 DIAGNOSIS — E1122 Type 2 diabetes mellitus with diabetic chronic kidney disease: Secondary | ICD-10-CM | POA: Diagnosis not present

## 2015-05-27 DIAGNOSIS — Z7984 Long term (current) use of oral hypoglycemic drugs: Secondary | ICD-10-CM | POA: Diagnosis not present

## 2015-05-27 DIAGNOSIS — E1142 Type 2 diabetes mellitus with diabetic polyneuropathy: Secondary | ICD-10-CM | POA: Diagnosis not present

## 2015-05-27 DIAGNOSIS — Z794 Long term (current) use of insulin: Secondary | ICD-10-CM | POA: Diagnosis not present

## 2015-05-27 DIAGNOSIS — N183 Chronic kidney disease, stage 3 (moderate): Secondary | ICD-10-CM | POA: Diagnosis not present

## 2015-07-17 DIAGNOSIS — I129 Hypertensive chronic kidney disease with stage 1 through stage 4 chronic kidney disease, or unspecified chronic kidney disease: Secondary | ICD-10-CM | POA: Diagnosis not present

## 2015-07-17 DIAGNOSIS — E1142 Type 2 diabetes mellitus with diabetic polyneuropathy: Secondary | ICD-10-CM | POA: Diagnosis not present

## 2015-07-17 DIAGNOSIS — Z794 Long term (current) use of insulin: Secondary | ICD-10-CM | POA: Diagnosis not present

## 2015-07-17 DIAGNOSIS — N183 Chronic kidney disease, stage 3 (moderate): Secondary | ICD-10-CM | POA: Diagnosis not present

## 2015-07-17 DIAGNOSIS — I639 Cerebral infarction, unspecified: Secondary | ICD-10-CM | POA: Diagnosis not present

## 2015-07-17 DIAGNOSIS — E7800 Pure hypercholesterolemia, unspecified: Secondary | ICD-10-CM | POA: Diagnosis not present

## 2015-07-17 DIAGNOSIS — E113292 Type 2 diabetes mellitus with mild nonproliferative diabetic retinopathy without macular edema, left eye: Secondary | ICD-10-CM | POA: Diagnosis not present

## 2015-07-17 DIAGNOSIS — E1122 Type 2 diabetes mellitus with diabetic chronic kidney disease: Secondary | ICD-10-CM | POA: Diagnosis not present

## 2015-07-21 DIAGNOSIS — E113511 Type 2 diabetes mellitus with proliferative diabetic retinopathy with macular edema, right eye: Secondary | ICD-10-CM | POA: Diagnosis not present

## 2015-07-21 DIAGNOSIS — H2512 Age-related nuclear cataract, left eye: Secondary | ICD-10-CM | POA: Diagnosis not present

## 2015-07-21 DIAGNOSIS — H02839 Dermatochalasis of unspecified eye, unspecified eyelid: Secondary | ICD-10-CM | POA: Diagnosis not present

## 2015-07-21 DIAGNOSIS — E113392 Type 2 diabetes mellitus with moderate nonproliferative diabetic retinopathy without macular edema, left eye: Secondary | ICD-10-CM | POA: Diagnosis not present

## 2015-07-21 DIAGNOSIS — Z961 Presence of intraocular lens: Secondary | ICD-10-CM | POA: Diagnosis not present

## 2015-07-21 DIAGNOSIS — E11319 Type 2 diabetes mellitus with unspecified diabetic retinopathy without macular edema: Secondary | ICD-10-CM | POA: Diagnosis not present

## 2015-07-21 DIAGNOSIS — I10 Essential (primary) hypertension: Secondary | ICD-10-CM | POA: Diagnosis not present

## 2015-09-02 DIAGNOSIS — E113392 Type 2 diabetes mellitus with moderate nonproliferative diabetic retinopathy without macular edema, left eye: Secondary | ICD-10-CM | POA: Diagnosis not present

## 2015-09-02 DIAGNOSIS — E113591 Type 2 diabetes mellitus with proliferative diabetic retinopathy without macular edema, right eye: Secondary | ICD-10-CM | POA: Diagnosis not present

## 2015-09-02 DIAGNOSIS — H43813 Vitreous degeneration, bilateral: Secondary | ICD-10-CM | POA: Diagnosis not present

## 2015-09-14 DIAGNOSIS — H2512 Age-related nuclear cataract, left eye: Secondary | ICD-10-CM | POA: Diagnosis not present

## 2015-09-14 DIAGNOSIS — H25812 Combined forms of age-related cataract, left eye: Secondary | ICD-10-CM | POA: Diagnosis not present

## 2015-09-16 DIAGNOSIS — N183 Chronic kidney disease, stage 3 (moderate): Secondary | ICD-10-CM | POA: Diagnosis not present

## 2015-09-16 DIAGNOSIS — E7800 Pure hypercholesterolemia, unspecified: Secondary | ICD-10-CM | POA: Diagnosis not present

## 2015-12-04 DIAGNOSIS — E1142 Type 2 diabetes mellitus with diabetic polyneuropathy: Secondary | ICD-10-CM | POA: Diagnosis not present

## 2015-12-04 DIAGNOSIS — E113299 Type 2 diabetes mellitus with mild nonproliferative diabetic retinopathy without macular edema, unspecified eye: Secondary | ICD-10-CM | POA: Diagnosis not present

## 2015-12-04 DIAGNOSIS — E1122 Type 2 diabetes mellitus with diabetic chronic kidney disease: Secondary | ICD-10-CM | POA: Diagnosis not present

## 2015-12-04 DIAGNOSIS — I10 Essential (primary) hypertension: Secondary | ICD-10-CM | POA: Diagnosis not present

## 2015-12-04 DIAGNOSIS — N183 Chronic kidney disease, stage 3 (moderate): Secondary | ICD-10-CM | POA: Diagnosis not present

## 2015-12-04 DIAGNOSIS — Z794 Long term (current) use of insulin: Secondary | ICD-10-CM | POA: Diagnosis not present

## 2016-01-15 DIAGNOSIS — E1142 Type 2 diabetes mellitus with diabetic polyneuropathy: Secondary | ICD-10-CM | POA: Diagnosis not present

## 2016-01-15 DIAGNOSIS — I639 Cerebral infarction, unspecified: Secondary | ICD-10-CM | POA: Diagnosis not present

## 2016-01-15 DIAGNOSIS — H3500 Unspecified background retinopathy: Secondary | ICD-10-CM | POA: Diagnosis not present

## 2016-01-15 DIAGNOSIS — N184 Chronic kidney disease, stage 4 (severe): Secondary | ICD-10-CM | POA: Diagnosis not present

## 2016-01-15 DIAGNOSIS — E7800 Pure hypercholesterolemia, unspecified: Secondary | ICD-10-CM | POA: Diagnosis not present

## 2016-01-15 DIAGNOSIS — I129 Hypertensive chronic kidney disease with stage 1 through stage 4 chronic kidney disease, or unspecified chronic kidney disease: Secondary | ICD-10-CM | POA: Diagnosis not present

## 2016-03-09 DIAGNOSIS — H3582 Retinal ischemia: Secondary | ICD-10-CM | POA: Diagnosis not present

## 2016-03-09 DIAGNOSIS — H43813 Vitreous degeneration, bilateral: Secondary | ICD-10-CM | POA: Diagnosis not present

## 2016-03-09 DIAGNOSIS — E113492 Type 2 diabetes mellitus with severe nonproliferative diabetic retinopathy without macular edema, left eye: Secondary | ICD-10-CM | POA: Diagnosis not present

## 2016-03-09 DIAGNOSIS — E113591 Type 2 diabetes mellitus with proliferative diabetic retinopathy without macular edema, right eye: Secondary | ICD-10-CM | POA: Diagnosis not present

## 2016-03-14 DIAGNOSIS — I129 Hypertensive chronic kidney disease with stage 1 through stage 4 chronic kidney disease, or unspecified chronic kidney disease: Secondary | ICD-10-CM | POA: Diagnosis not present

## 2016-03-14 DIAGNOSIS — I10 Essential (primary) hypertension: Secondary | ICD-10-CM | POA: Diagnosis not present

## 2016-03-14 DIAGNOSIS — N183 Chronic kidney disease, stage 3 (moderate): Secondary | ICD-10-CM | POA: Diagnosis not present

## 2016-03-14 DIAGNOSIS — E1122 Type 2 diabetes mellitus with diabetic chronic kidney disease: Secondary | ICD-10-CM | POA: Diagnosis not present

## 2016-03-14 DIAGNOSIS — Z23 Encounter for immunization: Secondary | ICD-10-CM | POA: Diagnosis not present

## 2016-03-14 DIAGNOSIS — E113299 Type 2 diabetes mellitus with mild nonproliferative diabetic retinopathy without macular edema, unspecified eye: Secondary | ICD-10-CM | POA: Diagnosis not present

## 2016-03-14 DIAGNOSIS — Z794 Long term (current) use of insulin: Secondary | ICD-10-CM | POA: Diagnosis not present

## 2016-03-14 DIAGNOSIS — E7800 Pure hypercholesterolemia, unspecified: Secondary | ICD-10-CM | POA: Diagnosis not present

## 2016-03-14 DIAGNOSIS — E1142 Type 2 diabetes mellitus with diabetic polyneuropathy: Secondary | ICD-10-CM | POA: Diagnosis not present

## 2016-04-06 DIAGNOSIS — N183 Chronic kidney disease, stage 3 (moderate): Secondary | ICD-10-CM | POA: Diagnosis not present

## 2016-04-06 DIAGNOSIS — I129 Hypertensive chronic kidney disease with stage 1 through stage 4 chronic kidney disease, or unspecified chronic kidney disease: Secondary | ICD-10-CM | POA: Diagnosis not present

## 2016-04-06 DIAGNOSIS — E1129 Type 2 diabetes mellitus with other diabetic kidney complication: Secondary | ICD-10-CM | POA: Diagnosis not present

## 2016-04-06 DIAGNOSIS — N184 Chronic kidney disease, stage 4 (severe): Secondary | ICD-10-CM | POA: Diagnosis not present

## 2016-04-06 DIAGNOSIS — I639 Cerebral infarction, unspecified: Secondary | ICD-10-CM | POA: Diagnosis not present

## 2016-04-08 ENCOUNTER — Other Ambulatory Visit: Payer: Self-pay | Admitting: Nephrology

## 2016-04-08 DIAGNOSIS — N183 Chronic kidney disease, stage 3 unspecified: Secondary | ICD-10-CM

## 2016-04-28 ENCOUNTER — Ambulatory Visit
Admission: RE | Admit: 2016-04-28 | Discharge: 2016-04-28 | Disposition: A | Discharge status: Home / Self Care | Payer: Commercial Managed Care - HMO | Source: Ambulatory Visit | Attending: Nephrology | Admitting: Nephrology

## 2016-04-28 DIAGNOSIS — N183 Chronic kidney disease, stage 3 unspecified: Secondary | ICD-10-CM

## 2016-05-17 DIAGNOSIS — H113 Conjunctival hemorrhage: Secondary | ICD-10-CM | POA: Diagnosis not present

## 2016-05-17 DIAGNOSIS — H18412 Arcus senilis, left eye: Secondary | ICD-10-CM | POA: Diagnosis not present

## 2016-05-17 DIAGNOSIS — Z961 Presence of intraocular lens: Secondary | ICD-10-CM | POA: Diagnosis not present

## 2016-05-17 DIAGNOSIS — H18413 Arcus senilis, bilateral: Secondary | ICD-10-CM | POA: Diagnosis not present

## 2016-05-17 DIAGNOSIS — H02839 Dermatochalasis of unspecified eye, unspecified eyelid: Secondary | ICD-10-CM | POA: Diagnosis not present

## 2016-05-17 DIAGNOSIS — H18411 Arcus senilis, right eye: Secondary | ICD-10-CM | POA: Diagnosis not present

## 2016-06-16 DIAGNOSIS — N183 Chronic kidney disease, stage 3 (moderate): Secondary | ICD-10-CM | POA: Diagnosis not present

## 2016-06-16 DIAGNOSIS — Z794 Long term (current) use of insulin: Secondary | ICD-10-CM | POA: Diagnosis not present

## 2016-06-16 DIAGNOSIS — E1142 Type 2 diabetes mellitus with diabetic polyneuropathy: Secondary | ICD-10-CM | POA: Diagnosis not present

## 2016-06-16 DIAGNOSIS — E113299 Type 2 diabetes mellitus with mild nonproliferative diabetic retinopathy without macular edema, unspecified eye: Secondary | ICD-10-CM | POA: Diagnosis not present

## 2016-06-16 DIAGNOSIS — E1122 Type 2 diabetes mellitus with diabetic chronic kidney disease: Secondary | ICD-10-CM | POA: Diagnosis not present

## 2016-07-18 DIAGNOSIS — Z1389 Encounter for screening for other disorder: Secondary | ICD-10-CM | POA: Diagnosis not present

## 2016-07-18 DIAGNOSIS — I129 Hypertensive chronic kidney disease with stage 1 through stage 4 chronic kidney disease, or unspecified chronic kidney disease: Secondary | ICD-10-CM | POA: Diagnosis not present

## 2016-07-18 DIAGNOSIS — E1122 Type 2 diabetes mellitus with diabetic chronic kidney disease: Secondary | ICD-10-CM | POA: Diagnosis not present

## 2016-07-18 DIAGNOSIS — E1142 Type 2 diabetes mellitus with diabetic polyneuropathy: Secondary | ICD-10-CM | POA: Diagnosis not present

## 2016-07-18 DIAGNOSIS — E113293 Type 2 diabetes mellitus with mild nonproliferative diabetic retinopathy without macular edema, bilateral: Secondary | ICD-10-CM | POA: Diagnosis not present

## 2016-07-18 DIAGNOSIS — E7800 Pure hypercholesterolemia, unspecified: Secondary | ICD-10-CM | POA: Diagnosis not present

## 2016-07-18 DIAGNOSIS — N184 Chronic kidney disease, stage 4 (severe): Secondary | ICD-10-CM | POA: Diagnosis not present

## 2016-07-18 DIAGNOSIS — Z8673 Personal history of transient ischemic attack (TIA), and cerebral infarction without residual deficits: Secondary | ICD-10-CM | POA: Diagnosis not present

## 2016-07-18 DIAGNOSIS — H3500 Unspecified background retinopathy: Secondary | ICD-10-CM | POA: Diagnosis not present

## 2016-07-27 DIAGNOSIS — I639 Cerebral infarction, unspecified: Secondary | ICD-10-CM | POA: Diagnosis not present

## 2016-07-27 DIAGNOSIS — N183 Chronic kidney disease, stage 3 (moderate): Secondary | ICD-10-CM | POA: Diagnosis not present

## 2016-07-27 DIAGNOSIS — E559 Vitamin D deficiency, unspecified: Secondary | ICD-10-CM | POA: Diagnosis not present

## 2016-07-27 DIAGNOSIS — E1129 Type 2 diabetes mellitus with other diabetic kidney complication: Secondary | ICD-10-CM | POA: Diagnosis not present

## 2016-07-27 DIAGNOSIS — I129 Hypertensive chronic kidney disease with stage 1 through stage 4 chronic kidney disease, or unspecified chronic kidney disease: Secondary | ICD-10-CM | POA: Diagnosis not present

## 2016-09-07 DIAGNOSIS — E113593 Type 2 diabetes mellitus with proliferative diabetic retinopathy without macular edema, bilateral: Secondary | ICD-10-CM | POA: Diagnosis not present

## 2016-09-07 DIAGNOSIS — H3582 Retinal ischemia: Secondary | ICD-10-CM | POA: Diagnosis not present

## 2016-09-07 DIAGNOSIS — H43813 Vitreous degeneration, bilateral: Secondary | ICD-10-CM | POA: Diagnosis not present

## 2016-09-20 DIAGNOSIS — E1142 Type 2 diabetes mellitus with diabetic polyneuropathy: Secondary | ICD-10-CM | POA: Diagnosis not present

## 2016-09-20 DIAGNOSIS — E113299 Type 2 diabetes mellitus with mild nonproliferative diabetic retinopathy without macular edema, unspecified eye: Secondary | ICD-10-CM | POA: Diagnosis not present

## 2016-09-20 DIAGNOSIS — Z794 Long term (current) use of insulin: Secondary | ICD-10-CM | POA: Diagnosis not present

## 2016-09-20 DIAGNOSIS — E1122 Type 2 diabetes mellitus with diabetic chronic kidney disease: Secondary | ICD-10-CM | POA: Diagnosis not present

## 2016-09-20 DIAGNOSIS — N183 Chronic kidney disease, stage 3 (moderate): Secondary | ICD-10-CM | POA: Diagnosis not present

## 2016-12-23 DIAGNOSIS — Z794 Long term (current) use of insulin: Secondary | ICD-10-CM | POA: Diagnosis not present

## 2016-12-23 DIAGNOSIS — E113299 Type 2 diabetes mellitus with mild nonproliferative diabetic retinopathy without macular edema, unspecified eye: Secondary | ICD-10-CM | POA: Diagnosis not present

## 2016-12-23 DIAGNOSIS — E1122 Type 2 diabetes mellitus with diabetic chronic kidney disease: Secondary | ICD-10-CM | POA: Diagnosis not present

## 2016-12-23 DIAGNOSIS — N183 Chronic kidney disease, stage 3 (moderate): Secondary | ICD-10-CM | POA: Diagnosis not present

## 2016-12-23 DIAGNOSIS — E1142 Type 2 diabetes mellitus with diabetic polyneuropathy: Secondary | ICD-10-CM | POA: Diagnosis not present

## 2017-01-23 DIAGNOSIS — E1142 Type 2 diabetes mellitus with diabetic polyneuropathy: Secondary | ICD-10-CM | POA: Diagnosis not present

## 2017-01-23 DIAGNOSIS — H3500 Unspecified background retinopathy: Secondary | ICD-10-CM | POA: Diagnosis not present

## 2017-01-23 DIAGNOSIS — Z8673 Personal history of transient ischemic attack (TIA), and cerebral infarction without residual deficits: Secondary | ICD-10-CM | POA: Diagnosis not present

## 2017-01-23 DIAGNOSIS — E113291 Type 2 diabetes mellitus with mild nonproliferative diabetic retinopathy without macular edema, right eye: Secondary | ICD-10-CM | POA: Diagnosis not present

## 2017-01-23 DIAGNOSIS — I129 Hypertensive chronic kidney disease with stage 1 through stage 4 chronic kidney disease, or unspecified chronic kidney disease: Secondary | ICD-10-CM | POA: Diagnosis not present

## 2017-01-23 DIAGNOSIS — N184 Chronic kidney disease, stage 4 (severe): Secondary | ICD-10-CM | POA: Diagnosis not present

## 2017-01-23 DIAGNOSIS — E7800 Pure hypercholesterolemia, unspecified: Secondary | ICD-10-CM | POA: Diagnosis not present

## 2017-01-23 DIAGNOSIS — E1122 Type 2 diabetes mellitus with diabetic chronic kidney disease: Secondary | ICD-10-CM | POA: Diagnosis not present

## 2017-03-08 DIAGNOSIS — H43813 Vitreous degeneration, bilateral: Secondary | ICD-10-CM | POA: Diagnosis not present

## 2017-03-08 DIAGNOSIS — H3582 Retinal ischemia: Secondary | ICD-10-CM | POA: Diagnosis not present

## 2017-03-08 DIAGNOSIS — E113593 Type 2 diabetes mellitus with proliferative diabetic retinopathy without macular edema, bilateral: Secondary | ICD-10-CM | POA: Diagnosis not present

## 2017-03-08 DIAGNOSIS — H43392 Other vitreous opacities, left eye: Secondary | ICD-10-CM | POA: Diagnosis not present

## 2017-05-10 DIAGNOSIS — I639 Cerebral infarction, unspecified: Secondary | ICD-10-CM | POA: Diagnosis not present

## 2017-05-10 DIAGNOSIS — D631 Anemia in chronic kidney disease: Secondary | ICD-10-CM | POA: Diagnosis not present

## 2017-05-10 DIAGNOSIS — N2581 Secondary hyperparathyroidism of renal origin: Secondary | ICD-10-CM | POA: Diagnosis not present

## 2017-05-10 DIAGNOSIS — N183 Chronic kidney disease, stage 3 (moderate): Secondary | ICD-10-CM | POA: Diagnosis not present

## 2017-06-30 DIAGNOSIS — E1122 Type 2 diabetes mellitus with diabetic chronic kidney disease: Secondary | ICD-10-CM | POA: Diagnosis not present

## 2017-06-30 DIAGNOSIS — E113299 Type 2 diabetes mellitus with mild nonproliferative diabetic retinopathy without macular edema, unspecified eye: Secondary | ICD-10-CM | POA: Diagnosis not present

## 2017-06-30 DIAGNOSIS — N183 Chronic kidney disease, stage 3 (moderate): Secondary | ICD-10-CM | POA: Diagnosis not present

## 2017-06-30 DIAGNOSIS — E1142 Type 2 diabetes mellitus with diabetic polyneuropathy: Secondary | ICD-10-CM | POA: Diagnosis not present

## 2017-06-30 DIAGNOSIS — E1165 Type 2 diabetes mellitus with hyperglycemia: Secondary | ICD-10-CM | POA: Diagnosis not present

## 2017-06-30 DIAGNOSIS — Z794 Long term (current) use of insulin: Secondary | ICD-10-CM | POA: Diagnosis not present

## 2017-07-28 DIAGNOSIS — H3500 Unspecified background retinopathy: Secondary | ICD-10-CM | POA: Diagnosis not present

## 2017-07-28 DIAGNOSIS — N184 Chronic kidney disease, stage 4 (severe): Secondary | ICD-10-CM | POA: Diagnosis not present

## 2017-07-28 DIAGNOSIS — I129 Hypertensive chronic kidney disease with stage 1 through stage 4 chronic kidney disease, or unspecified chronic kidney disease: Secondary | ICD-10-CM | POA: Diagnosis not present

## 2017-07-28 DIAGNOSIS — E7800 Pure hypercholesterolemia, unspecified: Secondary | ICD-10-CM | POA: Diagnosis not present

## 2017-07-28 DIAGNOSIS — Z8673 Personal history of transient ischemic attack (TIA), and cerebral infarction without residual deficits: Secondary | ICD-10-CM | POA: Diagnosis not present

## 2017-07-28 DIAGNOSIS — Z23 Encounter for immunization: Secondary | ICD-10-CM | POA: Diagnosis not present

## 2017-07-28 DIAGNOSIS — E1122 Type 2 diabetes mellitus with diabetic chronic kidney disease: Secondary | ICD-10-CM | POA: Diagnosis not present

## 2017-09-12 DIAGNOSIS — E113593 Type 2 diabetes mellitus with proliferative diabetic retinopathy without macular edema, bilateral: Secondary | ICD-10-CM | POA: Diagnosis not present

## 2017-09-12 DIAGNOSIS — H43392 Other vitreous opacities, left eye: Secondary | ICD-10-CM | POA: Diagnosis not present

## 2017-09-12 DIAGNOSIS — H43813 Vitreous degeneration, bilateral: Secondary | ICD-10-CM | POA: Diagnosis not present

## 2017-09-12 DIAGNOSIS — H3582 Retinal ischemia: Secondary | ICD-10-CM | POA: Diagnosis not present

## 2017-10-17 DIAGNOSIS — N183 Chronic kidney disease, stage 3 (moderate): Secondary | ICD-10-CM | POA: Diagnosis not present

## 2017-10-17 DIAGNOSIS — E1122 Type 2 diabetes mellitus with diabetic chronic kidney disease: Secondary | ICD-10-CM | POA: Diagnosis not present

## 2017-10-17 DIAGNOSIS — Z794 Long term (current) use of insulin: Secondary | ICD-10-CM | POA: Diagnosis not present

## 2017-10-17 DIAGNOSIS — E1142 Type 2 diabetes mellitus with diabetic polyneuropathy: Secondary | ICD-10-CM | POA: Diagnosis not present

## 2017-10-17 DIAGNOSIS — E1165 Type 2 diabetes mellitus with hyperglycemia: Secondary | ICD-10-CM | POA: Diagnosis not present

## 2017-10-23 DIAGNOSIS — E7800 Pure hypercholesterolemia, unspecified: Secondary | ICD-10-CM | POA: Diagnosis not present

## 2017-10-23 DIAGNOSIS — I129 Hypertensive chronic kidney disease with stage 1 through stage 4 chronic kidney disease, or unspecified chronic kidney disease: Secondary | ICD-10-CM | POA: Diagnosis not present

## 2017-10-23 DIAGNOSIS — E1122 Type 2 diabetes mellitus with diabetic chronic kidney disease: Secondary | ICD-10-CM | POA: Diagnosis not present

## 2017-10-23 DIAGNOSIS — I639 Cerebral infarction, unspecified: Secondary | ICD-10-CM | POA: Diagnosis not present

## 2017-10-23 DIAGNOSIS — N2581 Secondary hyperparathyroidism of renal origin: Secondary | ICD-10-CM | POA: Diagnosis not present

## 2017-10-23 DIAGNOSIS — N183 Chronic kidney disease, stage 3 (moderate): Secondary | ICD-10-CM | POA: Diagnosis not present

## 2018-01-15 DIAGNOSIS — Z794 Long term (current) use of insulin: Secondary | ICD-10-CM | POA: Diagnosis not present

## 2018-01-15 DIAGNOSIS — N183 Chronic kidney disease, stage 3 (moderate): Secondary | ICD-10-CM | POA: Diagnosis not present

## 2018-01-15 DIAGNOSIS — E1122 Type 2 diabetes mellitus with diabetic chronic kidney disease: Secondary | ICD-10-CM | POA: Diagnosis not present

## 2018-01-15 DIAGNOSIS — E11319 Type 2 diabetes mellitus with unspecified diabetic retinopathy without macular edema: Secondary | ICD-10-CM | POA: Diagnosis not present

## 2018-01-15 DIAGNOSIS — E1165 Type 2 diabetes mellitus with hyperglycemia: Secondary | ICD-10-CM | POA: Diagnosis not present

## 2018-01-15 DIAGNOSIS — E1142 Type 2 diabetes mellitus with diabetic polyneuropathy: Secondary | ICD-10-CM | POA: Diagnosis not present

## 2018-01-25 DIAGNOSIS — E113492 Type 2 diabetes mellitus with severe nonproliferative diabetic retinopathy without macular edema, left eye: Secondary | ICD-10-CM | POA: Diagnosis not present

## 2018-01-25 DIAGNOSIS — Z0000 Encounter for general adult medical examination without abnormal findings: Secondary | ICD-10-CM | POA: Diagnosis not present

## 2018-01-25 DIAGNOSIS — E1122 Type 2 diabetes mellitus with diabetic chronic kidney disease: Secondary | ICD-10-CM | POA: Diagnosis not present

## 2018-01-25 DIAGNOSIS — I129 Hypertensive chronic kidney disease with stage 1 through stage 4 chronic kidney disease, or unspecified chronic kidney disease: Secondary | ICD-10-CM | POA: Diagnosis not present

## 2018-01-25 DIAGNOSIS — E1142 Type 2 diabetes mellitus with diabetic polyneuropathy: Secondary | ICD-10-CM | POA: Diagnosis not present

## 2018-01-25 DIAGNOSIS — N184 Chronic kidney disease, stage 4 (severe): Secondary | ICD-10-CM | POA: Diagnosis not present

## 2018-01-25 DIAGNOSIS — E113591 Type 2 diabetes mellitus with proliferative diabetic retinopathy without macular edema, right eye: Secondary | ICD-10-CM | POA: Diagnosis not present

## 2018-01-25 DIAGNOSIS — Z8673 Personal history of transient ischemic attack (TIA), and cerebral infarction without residual deficits: Secondary | ICD-10-CM | POA: Diagnosis not present

## 2018-01-25 DIAGNOSIS — E7800 Pure hypercholesterolemia, unspecified: Secondary | ICD-10-CM | POA: Diagnosis not present

## 2018-03-20 DIAGNOSIS — H35372 Puckering of macula, left eye: Secondary | ICD-10-CM | POA: Diagnosis not present

## 2018-03-20 DIAGNOSIS — H43813 Vitreous degeneration, bilateral: Secondary | ICD-10-CM | POA: Diagnosis not present

## 2018-03-20 DIAGNOSIS — E113593 Type 2 diabetes mellitus with proliferative diabetic retinopathy without macular edema, bilateral: Secondary | ICD-10-CM | POA: Diagnosis not present

## 2018-03-20 DIAGNOSIS — H3582 Retinal ischemia: Secondary | ICD-10-CM | POA: Diagnosis not present

## 2018-04-20 DIAGNOSIS — E11319 Type 2 diabetes mellitus with unspecified diabetic retinopathy without macular edema: Secondary | ICD-10-CM | POA: Diagnosis not present

## 2018-04-20 DIAGNOSIS — E1122 Type 2 diabetes mellitus with diabetic chronic kidney disease: Secondary | ICD-10-CM | POA: Diagnosis not present

## 2018-04-20 DIAGNOSIS — E1142 Type 2 diabetes mellitus with diabetic polyneuropathy: Secondary | ICD-10-CM | POA: Diagnosis not present

## 2018-04-20 DIAGNOSIS — Z794 Long term (current) use of insulin: Secondary | ICD-10-CM | POA: Diagnosis not present

## 2018-04-20 DIAGNOSIS — E1165 Type 2 diabetes mellitus with hyperglycemia: Secondary | ICD-10-CM | POA: Diagnosis not present

## 2018-04-20 DIAGNOSIS — N183 Chronic kidney disease, stage 3 (moderate): Secondary | ICD-10-CM | POA: Diagnosis not present

## 2018-04-20 DIAGNOSIS — Z87898 Personal history of other specified conditions: Secondary | ICD-10-CM | POA: Diagnosis not present

## 2018-04-20 DIAGNOSIS — Z23 Encounter for immunization: Secondary | ICD-10-CM | POA: Diagnosis not present

## 2018-05-04 DIAGNOSIS — N183 Chronic kidney disease, stage 3 (moderate): Secondary | ICD-10-CM | POA: Diagnosis not present

## 2018-05-04 DIAGNOSIS — E1142 Type 2 diabetes mellitus with diabetic polyneuropathy: Secondary | ICD-10-CM | POA: Diagnosis not present

## 2018-05-04 DIAGNOSIS — E1165 Type 2 diabetes mellitus with hyperglycemia: Secondary | ICD-10-CM | POA: Diagnosis not present

## 2018-05-04 DIAGNOSIS — Z87898 Personal history of other specified conditions: Secondary | ICD-10-CM | POA: Diagnosis not present

## 2018-05-10 DIAGNOSIS — D631 Anemia in chronic kidney disease: Secondary | ICD-10-CM | POA: Diagnosis not present

## 2018-05-10 DIAGNOSIS — E559 Vitamin D deficiency, unspecified: Secondary | ICD-10-CM | POA: Diagnosis not present

## 2018-05-10 DIAGNOSIS — I129 Hypertensive chronic kidney disease with stage 1 through stage 4 chronic kidney disease, or unspecified chronic kidney disease: Secondary | ICD-10-CM | POA: Diagnosis not present

## 2018-05-10 DIAGNOSIS — N183 Chronic kidney disease, stage 3 (moderate): Secondary | ICD-10-CM | POA: Diagnosis not present

## 2018-05-10 DIAGNOSIS — I639 Cerebral infarction, unspecified: Secondary | ICD-10-CM | POA: Diagnosis not present

## 2018-05-10 DIAGNOSIS — N2581 Secondary hyperparathyroidism of renal origin: Secondary | ICD-10-CM | POA: Diagnosis not present

## 2018-05-10 DIAGNOSIS — N189 Chronic kidney disease, unspecified: Secondary | ICD-10-CM | POA: Diagnosis not present

## 2018-05-10 DIAGNOSIS — E1122 Type 2 diabetes mellitus with diabetic chronic kidney disease: Secondary | ICD-10-CM | POA: Diagnosis not present

## 2018-05-24 DIAGNOSIS — I129 Hypertensive chronic kidney disease with stage 1 through stage 4 chronic kidney disease, or unspecified chronic kidney disease: Secondary | ICD-10-CM | POA: Diagnosis not present

## 2018-07-30 DIAGNOSIS — E1122 Type 2 diabetes mellitus with diabetic chronic kidney disease: Secondary | ICD-10-CM | POA: Diagnosis not present

## 2018-07-30 DIAGNOSIS — I129 Hypertensive chronic kidney disease with stage 1 through stage 4 chronic kidney disease, or unspecified chronic kidney disease: Secondary | ICD-10-CM | POA: Diagnosis not present

## 2018-07-30 DIAGNOSIS — E1142 Type 2 diabetes mellitus with diabetic polyneuropathy: Secondary | ICD-10-CM | POA: Diagnosis not present

## 2018-07-30 DIAGNOSIS — N183 Chronic kidney disease, stage 3 (moderate): Secondary | ICD-10-CM | POA: Diagnosis not present

## 2018-07-30 DIAGNOSIS — H3500 Unspecified background retinopathy: Secondary | ICD-10-CM | POA: Diagnosis not present

## 2018-07-30 DIAGNOSIS — E113299 Type 2 diabetes mellitus with mild nonproliferative diabetic retinopathy without macular edema, unspecified eye: Secondary | ICD-10-CM | POA: Diagnosis not present

## 2018-07-30 DIAGNOSIS — E7800 Pure hypercholesterolemia, unspecified: Secondary | ICD-10-CM | POA: Diagnosis not present

## 2018-07-30 DIAGNOSIS — Z8673 Personal history of transient ischemic attack (TIA), and cerebral infarction without residual deficits: Secondary | ICD-10-CM | POA: Diagnosis not present

## 2018-07-30 DIAGNOSIS — Z794 Long term (current) use of insulin: Secondary | ICD-10-CM | POA: Diagnosis not present

## 2018-08-06 DIAGNOSIS — E1142 Type 2 diabetes mellitus with diabetic polyneuropathy: Secondary | ICD-10-CM | POA: Diagnosis not present

## 2018-08-06 DIAGNOSIS — Z87898 Personal history of other specified conditions: Secondary | ICD-10-CM | POA: Diagnosis not present

## 2018-08-06 DIAGNOSIS — Z794 Long term (current) use of insulin: Secondary | ICD-10-CM | POA: Diagnosis not present

## 2018-08-06 DIAGNOSIS — E1122 Type 2 diabetes mellitus with diabetic chronic kidney disease: Secondary | ICD-10-CM | POA: Diagnosis not present

## 2018-08-06 DIAGNOSIS — E1165 Type 2 diabetes mellitus with hyperglycemia: Secondary | ICD-10-CM | POA: Diagnosis not present

## 2018-08-06 DIAGNOSIS — E11319 Type 2 diabetes mellitus with unspecified diabetic retinopathy without macular edema: Secondary | ICD-10-CM | POA: Diagnosis not present

## 2018-08-06 DIAGNOSIS — N183 Chronic kidney disease, stage 3 (moderate): Secondary | ICD-10-CM | POA: Diagnosis not present

## 2018-08-11 ENCOUNTER — Encounter (HOSPITAL_COMMUNITY): Payer: Self-pay

## 2018-08-11 ENCOUNTER — Other Ambulatory Visit: Payer: Self-pay

## 2018-08-11 ENCOUNTER — Ambulatory Visit (HOSPITAL_COMMUNITY)
Admission: EM | Admit: 2018-08-11 | Discharge: 2018-08-11 | Disposition: A | Discharge status: Home / Self Care | Payer: Medicare HMO | Attending: Family Medicine | Admitting: Family Medicine

## 2018-08-11 DIAGNOSIS — R238 Other skin changes: Secondary | ICD-10-CM

## 2018-08-11 DIAGNOSIS — T22211A Burn of second degree of right forearm, initial encounter: Secondary | ICD-10-CM

## 2018-08-11 MED ORDER — SILVER SULFADIAZINE 1 % EX CREA: 1.0000 "application " | TOPICAL_CREAM | Freq: Every day | CUTANEOUS | 0 refills | Status: DC

## 2018-08-11 NOTE — ED Provider Notes (Signed)
[place]    CSN: [id number] Arrival date & time: 08/11/18  1108     History   Chief Complaint Chief Complaint  Patient presents with  . Blister    on left leg   . Arm Injury    burn on rt arm    HPI Steve Martinez is a 78 y.o. male.   This is a 78 year old established [facility] patient who comes in complaining of  burn to rt arm and and blister on left leg x4 days, pt denies any injuries  Patient had a grease burn to his right forearm when he was cooking bacon a week ago.  Patient also has a spontaneous left lower leg bulla that measures 3 x 6 cm.  This is nontender and he has no recollection of any injury.  He does have chronic edema.     Past Medical History:  Diagnosis Date  . Back pain    occasionally d/t pulled muscle  . Cataracts, bilateral   . Diabetes mellitus    lantus bid  . Hemorrhoids   . History of bronchitis    last year  . Hyperlipidemia    takes Zocor daily  . Hypertension    takes Diltiazem,Maxzide,and Quinapril daily  . Myocardial infarction ([place]) 17-47yrs ago  . Stroke ([place])    x 3;left side weaker    There are no active problems to display for this patient.   Past Surgical History:  Procedure Laterality Date  . CARDIAC CATHETERIZATION  17-17yrs ago  . CATARACT EXTRACTION W/PHACO  02/08/2012   Procedure: CATARACT EXTRACTION PHACO AND INTRAOCULAR LENS PLACEMENT (IOC);  Surgeon: Adonis Brook, MD;  Location: [facility];  Service: Ophthalmology;  Laterality: Right;  . COLONOSCOPY    . REFRACTIVE SURGERY         Home Medications    Prior to Admission medications   Medication Sig Start Date End Date Taking? Authorizing Provider  aspirin EC 325 MG tablet Take 325 mg by mouth daily.   Yes [provider]  diltiazem (DILACOR XR) 240 MG 24 hr capsule Take 240 mg by mouth daily.   Yes [provider]  insulin glargine (LANTUS) 100 UNIT/ML injection Inject 15-20 Units into the skin 2 (two) times daily.  15 units in the morning and 20 units at night   Yes [provider]  simvastatin (ZOCOR) 40 MG tablet Take 40 mg by mouth daily.   Yes [provider]  triamterene-hydrochlorothiazide (MAXZIDE-25) 37.5-25 MG per tablet Take 1 tablet by mouth daily.   Yes [provider]  quinapril (ACCUPRIL) 5 MG tablet Take 5 mg by mouth daily.    [provider]  silver sulfADIAZINE (SILVADENE) 1 % cream Apply 1 application topically daily. 08/11/18   Robyn Haber, MD    Family History History reviewed. No pertinent family history.  Social History Social History   Tobacco Use  . Smoking status: Never Smoker  . Smokeless tobacco: Never Used  Substance Use Topics  . Alcohol use: No  . Drug use: No     Allergies   Patient has no known allergies.   Review of Systems Review of Systems   Physical Exam Triage Vital Signs ED Triage Vitals [08/11/18 1204]  Enc Vitals Group     BP      Pulse Rate 62     Resp 16     Temp (!) 97.5 F (36.4 C)     Temp Source Oral  SpO2 99 %     Weight      Height      Head Circumference      Peak Flow      Pain Score      Pain Loc      Pain Edu?      Excl. in [place]?    No data found.  Updated Vital Signs BP (!) 174/71 (BP Location: Left Arm)   Pulse 62   Temp (!) 97.5 F (36.4 C) (Oral)   Resp 16   SpO2 99%    Physical Exam Vitals signs and nursing note reviewed.  Constitutional:      Appearance: Normal appearance.  HENT:     Head: Normocephalic.     Mouth/Throat:     Mouth: Mucous membranes are moist.     Pharynx: Oropharynx is clear.  Eyes:     Conjunctiva/sclera: Conjunctivae normal.  Neck:     Musculoskeletal: Normal range of motion and neck supple.  Cardiovascular:     Rate and Rhythm: Normal rate.     Pulses: Normal pulses.     Heart sounds: Normal heart sounds.  Pulmonary:     Effort: Pulmonary effort is normal.     Breath sounds: Normal breath sounds.  Musculoskeletal: Normal range  of motion.  Skin:    General: Skin is warm.     Comments: 3 x 3 right forearm second-degree burn exposing underlying dermis  Patient also has a 3 x 6 cm bulla that was drained  Neurological:     General: No focal deficit present.     Mental Status: He is alert and oriented to person, place, and time.     Comments: Patient has a very mild pill-rolling tremor of his hands  Psychiatric:        Mood and Affect: Mood normal.        Thought Content: Thought content normal.      UC Treatments / Results  Labs (all labs ordered are listed, but only abnormal results are displayed) Labs Reviewed - No data to display  EKG None  Radiology No results found.  Procedures Incision and Drainage Date/Time: 08/11/2018 12:27 PM Performed by: Robyn Haber, MD Authorized by: Robyn Haber, MD   Consent:    Consent obtained:  Verbal   Consent given by:  Patient   Risks discussed:  Infection   Alternatives discussed:  No treatment Location:    Type:  Bulla   Location:  Lower extremity   Lower extremity location:  Leg   Leg location:  L lower leg Pre-procedure details:    Skin preparation:  Antiseptic wash Anesthesia (see MAR for exact dosages):    Anesthesia method:  None Procedure type:    Complexity:  Simple Procedure details:    Needle aspiration: yes     Needle size:  18 G   Incision depth:  Dermal   Drainage:  Serous   Drainage amount:  Copious   Wound treatment:  Wound left open   Packing materials:  None Post-procedure details:    Patient tolerance of procedure:  Tolerated well, no immediate complications   (including critical care time)  Medications Ordered in UC Medications - No data to display  Initial Impression / Assessment and Plan / UC Course  I have reviewed the triage vital signs and the nursing notes.  Pertinent labs & imaging results that were available during my care of the patient were reviewed by me and considered in my medical decision  making  (see chart for details).    Final Clinical Impressions(s) / UC Diagnoses   Final diagnoses:  Partial thickness burn of right forearm, initial encounter  Skin bulla     Discharge Instructions     Please return in a week so we can check the burn and how its healing.  Every day, gently wash the burn and blister area with soap and water.  Then apply the cream that you were prescribed followed by the nonstick Telfa pad and then gauze and finally Coban elastic dressing.  Make sure you do not put any adhesive tape on the skin.    ED Prescriptions    Medication Sig Dispense Auth. Provider   silver sulfADIAZINE (SILVADENE) 1 % cream Apply 1 application topically daily. 85 g Robyn Haber, MD     Controlled Substance Prescriptions [place] Controlled Substance Registry consulted? Not Applicable   Robyn Haber, MD 08/11/18 1229

## 2018-08-11 NOTE — Discharge Instructions (Addendum)
Please return in a week so we can check the burn and how its healing.  Every day, gently wash the burn and blister area with soap and water.  Then apply the cream that you were prescribed followed by the nonstick Telfa pad and then gauze and finally Coban elastic dressing.  Make sure you do not put any adhesive tape on the skin.

## 2018-08-11 NOTE — ED Triage Notes (Signed)
Pt presents to [facility] for burn to rt arm and and blister on left leg x4 days, pt denies any injuries

## 2018-08-18 ENCOUNTER — Ambulatory Visit (HOSPITAL_COMMUNITY)
Admission: EM | Admit: 2018-08-18 | Discharge: 2018-08-18 | Disposition: A | Discharge status: Home / Self Care | Payer: Medicare Other | Attending: Family Medicine | Admitting: Family Medicine

## 2018-08-18 ENCOUNTER — Other Ambulatory Visit: Payer: Self-pay

## 2018-08-18 ENCOUNTER — Encounter (HOSPITAL_COMMUNITY): Payer: Self-pay

## 2018-08-18 DIAGNOSIS — T300 Burn of unspecified body region, unspecified degree: Secondary | ICD-10-CM

## 2018-08-18 DIAGNOSIS — R600 Localized edema: Secondary | ICD-10-CM | POA: Diagnosis not present

## 2018-08-18 DIAGNOSIS — Z09 Encounter for follow-up examination after completed treatment for conditions other than malignant neoplasm: Secondary | ICD-10-CM | POA: Diagnosis not present

## 2018-08-18 DIAGNOSIS — X19XXXA Contact with other heat and hot substances, initial encounter: Secondary | ICD-10-CM | POA: Diagnosis not present

## 2018-08-18 DIAGNOSIS — T22011S Burn of unspecified degree of right forearm, sequela: Secondary | ICD-10-CM | POA: Diagnosis not present

## 2018-08-18 DIAGNOSIS — M25472 Effusion, left ankle: Secondary | ICD-10-CM

## 2018-08-18 DIAGNOSIS — M25471 Effusion, right ankle: Secondary | ICD-10-CM

## 2018-08-18 DIAGNOSIS — Z23 Encounter for immunization: Secondary | ICD-10-CM

## 2018-08-18 DIAGNOSIS — T22011A Burn of unspecified degree of right forearm, initial encounter: Secondary | ICD-10-CM | POA: Diagnosis not present

## 2018-08-18 MED ORDER — TETANUS-DIPHTH-ACELL PERTUSSIS 5-2.5-18.5 LF-MCG/0.5 IM SUSP
INTRAMUSCULAR | Status: AC
  Filled 2018-08-18: qty 0.5

## 2018-08-18 MED ORDER — TETANUS-DIPHTH-ACELL PERTUSSIS 5-2.5-18.5 LF-MCG/0.5 IM SUSP
0.5000 mL | Freq: Once | INTRAMUSCULAR | Status: AC
  Administered 2018-08-18: 0.5 mL via INTRAMUSCULAR

## 2018-08-18 NOTE — Discharge Instructions (Addendum)
Continue to wash the wounds once a day and apply the antibiotic cream Cover with a nonstick dressing Watch for signs of infection, increased pain, redness, yellow drainage Return here or to see your primary care doctor in another week or so See someone right away if you feel like you are getting worse

## 2018-08-18 NOTE — ED Provider Notes (Signed)
[place]    CSN: [id number] Arrival date & time: 08/18/18  1154     History   Chief Complaint Chief Complaint  Patient presents with  . Follow-up    HPI Steve Martinez is a 78 y.o. male.   HPI  For follow-up of his burn.  He has been cleaning it daily and applying Silvadene.  No questions or problems. The burn is on his right forearm.  He feels like it is doing well He also had a blister on his left lower leg that he is treating similarly.  Past Medical History:  Diagnosis Date  . Back pain    occasionally d/t pulled muscle  . Cataracts, bilateral   . Diabetes mellitus    lantus bid  . Hemorrhoids   . History of bronchitis    last year  . Hyperlipidemia    takes Zocor daily  . Hypertension    takes Diltiazem,Maxzide,and Quinapril daily  . Myocardial infarction ([place]) 17-7yrs ago  . Stroke ([place])    x 3;left side weaker    There are no active problems to display for this patient.   Past Surgical History:  Procedure Laterality Date  . CARDIAC CATHETERIZATION  17-101yrs ago  . CATARACT EXTRACTION W/PHACO  02/08/2012   Procedure: CATARACT EXTRACTION PHACO AND INTRAOCULAR LENS PLACEMENT (IOC);  Surgeon: Adonis Brook, MD;  Location: [facility];  Service: Ophthalmology;  Laterality: Right;  . COLONOSCOPY    . REFRACTIVE SURGERY         Home Medications    Prior to Admission medications   Medication Sig Start Date End Date Taking? Authorizing Provider  aspirin EC 325 MG tablet Take 325 mg by mouth daily.   Yes [provider]  diltiazem (DILACOR XR) 240 MG 24 hr capsule Take 240 mg by mouth daily.   Yes [provider]  insulin glargine (LANTUS) 100 UNIT/ML injection Inject 15-20 Units into the skin 2 (two) times daily. 15 units in the morning and 20 units at night   Yes [provider]  silver sulfADIAZINE (SILVADENE) 1 % cream Apply 1 application topically daily. 08/11/18  Yes Robyn Haber, MD  simvastatin (ZOCOR) 40 MG  tablet Take 40 mg by mouth daily.   Yes [provider]  triamterene-hydrochlorothiazide (MAXZIDE-25) 37.5-25 MG per tablet Take 1 tablet by mouth daily.   Yes [provider]    Family History No family history on file.  Social History Social History   Tobacco Use  . Smoking status: Never Smoker  . Smokeless tobacco: Never Used  Substance Use Topics  . Alcohol use: No  . Drug use: No     Allergies   Patient has no known allergies.   Review of Systems Review of Systems  Constitutional: Negative for chills and fever.  HENT: Negative for ear pain and sore throat.   Eyes: Negative for pain and visual disturbance.  Respiratory: Negative for cough and shortness of breath.   Cardiovascular: Negative for chest pain and palpitations.  Gastrointestinal: Negative for abdominal pain and vomiting.  Genitourinary: Negative for dysuria and hematuria.  Musculoskeletal: Negative for arthralgias and back pain.  Skin: Positive for wound. Negative for color change and rash.  Neurological: Negative for seizures and syncope.  All other systems reviewed and are negative.    Physical Exam Triage Vital Signs ED Triage Vitals  Enc Vitals Group     BP 08/18/18 1214 (!) 143/74     Pulse Rate 08/18/18 1214 76  Resp 08/18/18 1214 16     Temp 08/18/18 1214 98 F (36.7 C)     Temp Source 08/18/18 1214 Oral     SpO2 08/18/18 1214 97 %     Weight --      Height --      Head Circumference --      Peak Flow --      Pain Score 08/18/18 1215 9     Pain Loc --      Pain Edu? --      Excl. in [place]? --    No data found.  Updated Vital Signs BP (!) 143/74 (BP Location: Left Arm)   Pulse 76   Temp 98 F (36.7 C) (Oral)   Resp 16   SpO2 97%      Physical Exam Constitutional:      General: He is not in acute distress.    Appearance: He is well-developed.  HENT:     Head: Normocephalic and atraumatic.  Eyes:     Conjunctiva/sclera: Conjunctivae normal.     Pupils:  Pupils are equal, round, and reactive to light.  Neck:     Musculoskeletal: Normal range of motion.  Cardiovascular:     Rate and Rhythm: Normal rate.  Pulmonary:     Effort: Pulmonary effort is normal. No respiratory distress.  Abdominal:     General: There is no distension.     Palpations: Abdomen is soft.  Musculoskeletal: Normal range of motion.  Skin:    General: Skin is warm and dry.     Comments: Right forearm has a healing burn near the antecubital fossa.  No drainage, no redness, no sign of infection. He is wrapping the arm with Coban and it places the Coban is rubbing additional blisters on the skin.  Neurological:     Mental Status: He is alert.   The left lower leg is examined.  The blister has deflated.  It appears to be healing.  There is no redness or drainage.  He is wrapping the Coban in the morning, then the leg is swelling around the [place] wrap distal and proximal.  I am going to switch him to an Ace wrap for more flexibility  UC Treatments / Results  Labs (all labs ordered are listed, but only abnormal results are displayed) Labs Reviewed - No data to display  EKG None  Radiology No results found.  Procedures Procedures (including critical care time)  Medications Ordered in UC Medications  Tdap (BOOSTRIX) injection 0.5 mL (0.5 mLs Intramuscular Given 08/18/18 1243)    Initial Impression / Assessment and Plan / UC Course  I have reviewed the triage vital signs and the nursing notes.  Pertinent labs & imaging results that were available during my care of the patient were reviewed by me and considered in my medical decision making (see chart for details).    Will try different dressing.  He is taking good care of this.  No evidence of infection.  Final Clinical Impressions(s) / UC Diagnoses   Final diagnoses:  Burn due to contact with hot substance  Edema of both ankles     Discharge Instructions     Continue to wash the wounds once a day and  apply the antibiotic cream Cover with a nonstick dressing Watch for signs of infection, increased pain, redness, yellow drainage Return here or to see your primary care doctor in another week or so See someone right away if you feel like you are getting  worse    ED Prescriptions    None     Controlled Substance Prescriptions [place] Controlled Substance Registry consulted? Not Applicable   Raylene Everts, MD 08/18/18 1330

## 2018-08-18 NOTE — ED Triage Notes (Signed)
Pt is here today to follow up on a burn to his right arm and blister on his left lower leg.

## 2018-08-24 DIAGNOSIS — I639 Cerebral infarction, unspecified: Secondary | ICD-10-CM | POA: Diagnosis not present

## 2018-08-24 DIAGNOSIS — I129 Hypertensive chronic kidney disease with stage 1 through stage 4 chronic kidney disease, or unspecified chronic kidney disease: Secondary | ICD-10-CM | POA: Diagnosis not present

## 2018-08-24 DIAGNOSIS — N183 Chronic kidney disease, stage 3 (moderate): Secondary | ICD-10-CM | POA: Diagnosis not present

## 2018-08-24 DIAGNOSIS — E7800 Pure hypercholesterolemia, unspecified: Secondary | ICD-10-CM | POA: Diagnosis not present

## 2018-08-24 DIAGNOSIS — T300 Burn of unspecified body region, unspecified degree: Secondary | ICD-10-CM | POA: Diagnosis not present

## 2018-08-24 DIAGNOSIS — D631 Anemia in chronic kidney disease: Secondary | ICD-10-CM | POA: Diagnosis not present

## 2018-08-24 DIAGNOSIS — N2581 Secondary hyperparathyroidism of renal origin: Secondary | ICD-10-CM | POA: Diagnosis not present

## 2018-08-24 DIAGNOSIS — N189 Chronic kidney disease, unspecified: Secondary | ICD-10-CM | POA: Diagnosis not present

## 2018-08-24 DIAGNOSIS — E1122 Type 2 diabetes mellitus with diabetic chronic kidney disease: Secondary | ICD-10-CM | POA: Diagnosis not present

## 2018-08-28 ENCOUNTER — Other Ambulatory Visit: Payer: Self-pay | Admitting: Nephrology

## 2018-08-28 DIAGNOSIS — N183 Chronic kidney disease, stage 3 unspecified: Secondary | ICD-10-CM

## 2018-08-31 ENCOUNTER — Ambulatory Visit
Admission: RE | Admit: 2018-08-31 | Discharge: 2018-08-31 | Disposition: A | Discharge status: Home / Self Care | Payer: Medicare HMO | Source: Ambulatory Visit | Attending: Nephrology | Admitting: Nephrology

## 2018-08-31 DIAGNOSIS — N183 Chronic kidney disease, stage 3 unspecified: Secondary | ICD-10-CM

## 2018-10-01 DIAGNOSIS — E7800 Pure hypercholesterolemia, unspecified: Secondary | ICD-10-CM | POA: Diagnosis not present

## 2018-10-01 DIAGNOSIS — Z79899 Other long term (current) drug therapy: Secondary | ICD-10-CM | POA: Diagnosis not present

## 2018-10-19 DIAGNOSIS — I129 Hypertensive chronic kidney disease with stage 1 through stage 4 chronic kidney disease, or unspecified chronic kidney disease: Secondary | ICD-10-CM | POA: Diagnosis not present

## 2018-10-19 DIAGNOSIS — D631 Anemia in chronic kidney disease: Secondary | ICD-10-CM | POA: Diagnosis not present

## 2018-10-19 DIAGNOSIS — N183 Chronic kidney disease, stage 3 (moderate): Secondary | ICD-10-CM | POA: Diagnosis not present

## 2018-10-19 DIAGNOSIS — E1122 Type 2 diabetes mellitus with diabetic chronic kidney disease: Secondary | ICD-10-CM | POA: Diagnosis not present

## 2018-10-19 DIAGNOSIS — N2581 Secondary hyperparathyroidism of renal origin: Secondary | ICD-10-CM | POA: Diagnosis not present

## 2018-10-19 DIAGNOSIS — N189 Chronic kidney disease, unspecified: Secondary | ICD-10-CM | POA: Diagnosis not present

## 2018-11-08 DIAGNOSIS — Z794 Long term (current) use of insulin: Secondary | ICD-10-CM | POA: Diagnosis not present

## 2018-11-08 DIAGNOSIS — E1142 Type 2 diabetes mellitus with diabetic polyneuropathy: Secondary | ICD-10-CM | POA: Diagnosis not present

## 2018-11-08 DIAGNOSIS — E11319 Type 2 diabetes mellitus with unspecified diabetic retinopathy without macular edema: Secondary | ICD-10-CM | POA: Diagnosis not present

## 2018-11-08 DIAGNOSIS — N183 Chronic kidney disease, stage 3 (moderate): Secondary | ICD-10-CM | POA: Diagnosis not present

## 2018-11-08 DIAGNOSIS — E1122 Type 2 diabetes mellitus with diabetic chronic kidney disease: Secondary | ICD-10-CM | POA: Diagnosis not present

## 2018-11-13 DIAGNOSIS — E1142 Type 2 diabetes mellitus with diabetic polyneuropathy: Secondary | ICD-10-CM | POA: Diagnosis not present

## 2018-11-13 DIAGNOSIS — E1165 Type 2 diabetes mellitus with hyperglycemia: Secondary | ICD-10-CM | POA: Diagnosis not present

## 2018-11-21 DIAGNOSIS — I129 Hypertensive chronic kidney disease with stage 1 through stage 4 chronic kidney disease, or unspecified chronic kidney disease: Secondary | ICD-10-CM | POA: Diagnosis not present

## 2018-11-21 DIAGNOSIS — N183 Chronic kidney disease, stage 3 (moderate): Secondary | ICD-10-CM | POA: Diagnosis not present

## 2018-11-21 DIAGNOSIS — N2581 Secondary hyperparathyroidism of renal origin: Secondary | ICD-10-CM | POA: Diagnosis not present

## 2018-11-21 DIAGNOSIS — D631 Anemia in chronic kidney disease: Secondary | ICD-10-CM | POA: Diagnosis not present

## 2018-11-21 DIAGNOSIS — E1122 Type 2 diabetes mellitus with diabetic chronic kidney disease: Secondary | ICD-10-CM | POA: Diagnosis not present

## 2018-12-13 DIAGNOSIS — I639 Cerebral infarction, unspecified: Secondary | ICD-10-CM | POA: Diagnosis not present

## 2018-12-13 DIAGNOSIS — N184 Chronic kidney disease, stage 4 (severe): Secondary | ICD-10-CM | POA: Diagnosis not present

## 2018-12-13 DIAGNOSIS — E1122 Type 2 diabetes mellitus with diabetic chronic kidney disease: Secondary | ICD-10-CM | POA: Diagnosis not present

## 2018-12-13 DIAGNOSIS — I129 Hypertensive chronic kidney disease with stage 1 through stage 4 chronic kidney disease, or unspecified chronic kidney disease: Secondary | ICD-10-CM | POA: Diagnosis not present

## 2018-12-13 DIAGNOSIS — E1142 Type 2 diabetes mellitus with diabetic polyneuropathy: Secondary | ICD-10-CM | POA: Diagnosis not present

## 2018-12-13 DIAGNOSIS — E1165 Type 2 diabetes mellitus with hyperglycemia: Secondary | ICD-10-CM | POA: Diagnosis not present

## 2018-12-13 DIAGNOSIS — I10 Essential (primary) hypertension: Secondary | ICD-10-CM | POA: Diagnosis not present

## 2018-12-13 DIAGNOSIS — N183 Chronic kidney disease, stage 3 (moderate): Secondary | ICD-10-CM | POA: Diagnosis not present

## 2018-12-13 DIAGNOSIS — E1140 Type 2 diabetes mellitus with diabetic neuropathy, unspecified: Secondary | ICD-10-CM | POA: Diagnosis not present

## 2018-12-24 DIAGNOSIS — E1165 Type 2 diabetes mellitus with hyperglycemia: Secondary | ICD-10-CM | POA: Diagnosis not present

## 2018-12-24 DIAGNOSIS — N183 Chronic kidney disease, stage 3 (moderate): Secondary | ICD-10-CM | POA: Diagnosis not present

## 2018-12-24 DIAGNOSIS — I639 Cerebral infarction, unspecified: Secondary | ICD-10-CM | POA: Diagnosis not present

## 2018-12-24 DIAGNOSIS — I129 Hypertensive chronic kidney disease with stage 1 through stage 4 chronic kidney disease, or unspecified chronic kidney disease: Secondary | ICD-10-CM | POA: Diagnosis not present

## 2018-12-24 DIAGNOSIS — N184 Chronic kidney disease, stage 4 (severe): Secondary | ICD-10-CM | POA: Diagnosis not present

## 2018-12-24 DIAGNOSIS — E1142 Type 2 diabetes mellitus with diabetic polyneuropathy: Secondary | ICD-10-CM | POA: Diagnosis not present

## 2018-12-24 DIAGNOSIS — E1140 Type 2 diabetes mellitus with diabetic neuropathy, unspecified: Secondary | ICD-10-CM | POA: Diagnosis not present

## 2018-12-24 DIAGNOSIS — E1122 Type 2 diabetes mellitus with diabetic chronic kidney disease: Secondary | ICD-10-CM | POA: Diagnosis not present

## 2018-12-24 DIAGNOSIS — I10 Essential (primary) hypertension: Secondary | ICD-10-CM | POA: Diagnosis not present

## 2019-01-04 DIAGNOSIS — H35372 Puckering of macula, left eye: Secondary | ICD-10-CM | POA: Diagnosis not present

## 2019-01-04 DIAGNOSIS — E113593 Type 2 diabetes mellitus with proliferative diabetic retinopathy without macular edema, bilateral: Secondary | ICD-10-CM | POA: Diagnosis not present

## 2019-01-04 DIAGNOSIS — H43813 Vitreous degeneration, bilateral: Secondary | ICD-10-CM | POA: Diagnosis not present

## 2019-01-28 ENCOUNTER — Encounter: Payer: Self-pay | Admitting: Gastroenterology

## 2019-01-31 DIAGNOSIS — N184 Chronic kidney disease, stage 4 (severe): Secondary | ICD-10-CM | POA: Diagnosis not present

## 2019-01-31 DIAGNOSIS — E1142 Type 2 diabetes mellitus with diabetic polyneuropathy: Secondary | ICD-10-CM | POA: Diagnosis not present

## 2019-01-31 DIAGNOSIS — E7800 Pure hypercholesterolemia, unspecified: Secondary | ICD-10-CM | POA: Diagnosis not present

## 2019-01-31 DIAGNOSIS — I129 Hypertensive chronic kidney disease with stage 1 through stage 4 chronic kidney disease, or unspecified chronic kidney disease: Secondary | ICD-10-CM | POA: Diagnosis not present

## 2019-01-31 DIAGNOSIS — E1122 Type 2 diabetes mellitus with diabetic chronic kidney disease: Secondary | ICD-10-CM | POA: Diagnosis not present

## 2019-01-31 DIAGNOSIS — H3500 Unspecified background retinopathy: Secondary | ICD-10-CM | POA: Diagnosis not present

## 2019-01-31 DIAGNOSIS — Z8673 Personal history of transient ischemic attack (TIA), and cerebral infarction without residual deficits: Secondary | ICD-10-CM | POA: Diagnosis not present

## 2019-01-31 DIAGNOSIS — Z0000 Encounter for general adult medical examination without abnormal findings: Secondary | ICD-10-CM | POA: Diagnosis not present

## 2019-01-31 DIAGNOSIS — Z1389 Encounter for screening for other disorder: Secondary | ICD-10-CM | POA: Diagnosis not present

## 2019-02-08 DIAGNOSIS — E1142 Type 2 diabetes mellitus with diabetic polyneuropathy: Secondary | ICD-10-CM | POA: Diagnosis not present

## 2019-02-08 DIAGNOSIS — Z794 Long term (current) use of insulin: Secondary | ICD-10-CM | POA: Diagnosis not present

## 2019-02-08 DIAGNOSIS — N183 Chronic kidney disease, stage 3 (moderate): Secondary | ICD-10-CM | POA: Diagnosis not present

## 2019-02-08 DIAGNOSIS — E1122 Type 2 diabetes mellitus with diabetic chronic kidney disease: Secondary | ICD-10-CM | POA: Diagnosis not present

## 2019-02-15 DIAGNOSIS — E1165 Type 2 diabetes mellitus with hyperglycemia: Secondary | ICD-10-CM | POA: Diagnosis not present

## 2019-02-18 DIAGNOSIS — I639 Cerebral infarction, unspecified: Secondary | ICD-10-CM | POA: Diagnosis not present

## 2019-02-18 DIAGNOSIS — E1140 Type 2 diabetes mellitus with diabetic neuropathy, unspecified: Secondary | ICD-10-CM | POA: Diagnosis not present

## 2019-02-18 DIAGNOSIS — N184 Chronic kidney disease, stage 4 (severe): Secondary | ICD-10-CM | POA: Diagnosis not present

## 2019-02-18 DIAGNOSIS — I129 Hypertensive chronic kidney disease with stage 1 through stage 4 chronic kidney disease, or unspecified chronic kidney disease: Secondary | ICD-10-CM | POA: Diagnosis not present

## 2019-02-18 DIAGNOSIS — E1165 Type 2 diabetes mellitus with hyperglycemia: Secondary | ICD-10-CM | POA: Diagnosis not present

## 2019-02-18 DIAGNOSIS — E1122 Type 2 diabetes mellitus with diabetic chronic kidney disease: Secondary | ICD-10-CM | POA: Diagnosis not present

## 2019-02-18 DIAGNOSIS — E1142 Type 2 diabetes mellitus with diabetic polyneuropathy: Secondary | ICD-10-CM | POA: Diagnosis not present

## 2019-02-18 DIAGNOSIS — I10 Essential (primary) hypertension: Secondary | ICD-10-CM | POA: Diagnosis not present

## 2019-02-18 DIAGNOSIS — N183 Chronic kidney disease, stage 3 (moderate): Secondary | ICD-10-CM | POA: Diagnosis not present

## 2019-02-19 DIAGNOSIS — N2581 Secondary hyperparathyroidism of renal origin: Secondary | ICD-10-CM | POA: Diagnosis not present

## 2019-02-19 DIAGNOSIS — E7800 Pure hypercholesterolemia, unspecified: Secondary | ICD-10-CM | POA: Diagnosis not present

## 2019-02-19 DIAGNOSIS — N184 Chronic kidney disease, stage 4 (severe): Secondary | ICD-10-CM | POA: Diagnosis not present

## 2019-02-19 DIAGNOSIS — E1122 Type 2 diabetes mellitus with diabetic chronic kidney disease: Secondary | ICD-10-CM | POA: Diagnosis not present

## 2019-02-19 DIAGNOSIS — N189 Chronic kidney disease, unspecified: Secondary | ICD-10-CM | POA: Diagnosis not present

## 2019-02-19 DIAGNOSIS — I129 Hypertensive chronic kidney disease with stage 1 through stage 4 chronic kidney disease, or unspecified chronic kidney disease: Secondary | ICD-10-CM | POA: Diagnosis not present

## 2019-02-19 DIAGNOSIS — D631 Anemia in chronic kidney disease: Secondary | ICD-10-CM | POA: Diagnosis not present

## 2019-03-29 DIAGNOSIS — I129 Hypertensive chronic kidney disease with stage 1 through stage 4 chronic kidney disease, or unspecified chronic kidney disease: Secondary | ICD-10-CM | POA: Diagnosis not present

## 2019-03-29 DIAGNOSIS — N184 Chronic kidney disease, stage 4 (severe): Secondary | ICD-10-CM | POA: Diagnosis not present

## 2019-03-29 DIAGNOSIS — E1142 Type 2 diabetes mellitus with diabetic polyneuropathy: Secondary | ICD-10-CM | POA: Diagnosis not present

## 2019-03-29 DIAGNOSIS — I10 Essential (primary) hypertension: Secondary | ICD-10-CM | POA: Diagnosis not present

## 2019-03-29 DIAGNOSIS — I639 Cerebral infarction, unspecified: Secondary | ICD-10-CM | POA: Diagnosis not present

## 2019-03-29 DIAGNOSIS — N1831 Chronic kidney disease, stage 3a: Secondary | ICD-10-CM | POA: Diagnosis not present

## 2019-03-29 DIAGNOSIS — E1165 Type 2 diabetes mellitus with hyperglycemia: Secondary | ICD-10-CM | POA: Diagnosis not present

## 2019-03-29 DIAGNOSIS — E1122 Type 2 diabetes mellitus with diabetic chronic kidney disease: Secondary | ICD-10-CM | POA: Diagnosis not present

## 2019-03-29 DIAGNOSIS — E1140 Type 2 diabetes mellitus with diabetic neuropathy, unspecified: Secondary | ICD-10-CM | POA: Diagnosis not present

## 2019-04-10 ENCOUNTER — Telehealth: Payer: Self-pay | Admitting: *Deleted

## 2019-04-10 NOTE — Telephone Encounter (Signed)
Pt. Called back and rescheduled PV and procedure.

## 2019-04-10 NOTE — Telephone Encounter (Signed)
MESSAGE LEFT FOR PT. TO CALL BACK TO RESCHEDULE PRE-VISIT OR PROCEDURE FOR 04/24/19 WILL BE CANCELLED,# LEFT PROVIDED FOR CALL BACK.

## 2019-04-24 ENCOUNTER — Encounter: Payer: Medicare HMO | Admitting: Gastroenterology

## 2019-05-07 ENCOUNTER — Other Ambulatory Visit: Payer: Self-pay

## 2019-05-07 ENCOUNTER — Encounter: Payer: Self-pay | Admitting: Gastroenterology

## 2019-05-07 ENCOUNTER — Ambulatory Visit (AMBULATORY_SURGERY_CENTER): Payer: Self-pay

## 2019-05-07 VITALS — Temp 96.2°F | Ht 73.0 in | Wt 172.0 lb

## 2019-05-07 DIAGNOSIS — Z1211 Encounter for screening for malignant neoplasm of colon: Secondary | ICD-10-CM

## 2019-05-07 MED ORDER — NA SULFATE-K SULFATE-MG SULF 17.5-3.13-1.6 GM/177ML PO SOLN: 1.0000 | Freq: Once | ORAL | 0 refills | Status: AC

## 2019-05-07 NOTE — Progress Notes (Signed)
Denies allergies to eggs or soy products. Denies complication of anesthesia or sedation. Denies use of weight loss medication. Denies use of O2.   Emmi instructions given for colonoscopy.  Covid screening is scheduled for Tuesday 05/14/19 @ 1:00 Pm.

## 2019-05-14 ENCOUNTER — Ambulatory Visit (INDEPENDENT_AMBULATORY_CARE_PROVIDER_SITE_OTHER): Payer: Medicare HMO

## 2019-05-14 ENCOUNTER — Other Ambulatory Visit: Payer: Self-pay | Admitting: Gastroenterology

## 2019-05-14 DIAGNOSIS — Z1159 Encounter for screening for other viral diseases: Secondary | ICD-10-CM | POA: Diagnosis not present

## 2019-05-15 LAB — SARS CORONAVIRUS 2 (TAT 6-24 HRS): SARS Coronavirus 2: NEGATIVE

## 2019-05-21 ENCOUNTER — Ambulatory Visit (AMBULATORY_SURGERY_CENTER): Payer: Medicare HMO | Admitting: Gastroenterology

## 2019-05-21 ENCOUNTER — Encounter: Payer: Self-pay | Admitting: Gastroenterology

## 2019-05-21 ENCOUNTER — Other Ambulatory Visit: Payer: Self-pay

## 2019-05-21 VITALS — BP 151/72 | HR 80 | Temp 97.7°F | Resp 11 | Ht 73.0 in | Wt 172.0 lb

## 2019-05-21 DIAGNOSIS — Z1211 Encounter for screening for malignant neoplasm of colon: Secondary | ICD-10-CM | POA: Diagnosis not present

## 2019-05-21 DIAGNOSIS — E785 Hyperlipidemia, unspecified: Secondary | ICD-10-CM | POA: Diagnosis not present

## 2019-05-21 DIAGNOSIS — N183 Chronic kidney disease, stage 3 unspecified: Secondary | ICD-10-CM | POA: Diagnosis not present

## 2019-05-21 DIAGNOSIS — I129 Hypertensive chronic kidney disease with stage 1 through stage 4 chronic kidney disease, or unspecified chronic kidney disease: Secondary | ICD-10-CM | POA: Diagnosis not present

## 2019-05-21 DIAGNOSIS — E1122 Type 2 diabetes mellitus with diabetic chronic kidney disease: Secondary | ICD-10-CM | POA: Diagnosis not present

## 2019-05-21 DIAGNOSIS — I2510 Atherosclerotic heart disease of native coronary artery without angina pectoris: Secondary | ICD-10-CM | POA: Diagnosis not present

## 2019-05-21 MED ORDER — SODIUM CHLORIDE 0.9 % IV SOLN: 500.0000 mL | Freq: Once | INTRAVENOUS | Status: AC

## 2019-05-21 NOTE — Op Note (Signed)
[facility] Patient Name: Steve Martinez Procedure Date: 05/21/2019 11:31 AM MRN: [id number] Endoscopist: Ladene Artist , MD Age: 78 Referring MD:  Date of Birth: 1940-06-28 Gender: Male Account #: [id number] Procedure:                Colonoscopy Indications:              Screening for colorectal malignant neoplasm Medicines:                Monitored Anesthesia Care Procedure:                Pre-Anesthesia Assessment:                           - Prior to the procedure, a History and Physical                            was performed, and patient medications and                            allergies were reviewed. The patient's tolerance of                            previous anesthesia was also reviewed. The risks                            and benefits of the procedure and the sedation                            options and risks were discussed with the patient.                            All questions were answered, and informed consent                            was obtained. Prior Anticoagulants: The patient has                            taken no previous anticoagulant or antiplatelet                            agents. ASA Grade Assessment: III - A patient with                            severe systemic disease. After reviewing the risks                            and benefits, the patient was deemed in                            satisfactory condition to undergo the procedure.                           After obtaining informed consent, the colonoscope  was passed under direct vision. Throughout the                            procedure, the patient's blood pressure, pulse, and                            oxygen saturations were monitored continuously. The                            Colonoscope was introduced through the anus and                            advanced to the the cecum, identified by                            appendiceal orifice and  ileocecal valve. The                            ileocecal valve, appendiceal orifice, and rectum                            were photographed. The quality of the bowel                            preparation was good. The colonoscopy was performed                            without difficulty. The patient tolerated the                            procedure well. Scope In: 11:41:35 AM Scope Out: 11:54:59 AM Scope Withdrawal Time: 0 hours 9 minutes 23 seconds  Total Procedure Duration: 0 hours 13 minutes 24 seconds  Findings:                 The perianal and digital rectal examinations were                            normal.                           A diffuse area of mild melanosis was found in the                            rectum, in the sigmoid colon, in the descending                            colon, at the splenic flexure and in the transverse                            colon.                           Internal hemorrhoids were found during  retroflexion. The hemorrhoids were medium-sized and                            Grade I (internal hemorrhoids that do not prolapse).                           The exam was otherwise without abnormality on                            direct and retroflexion views. Complications:            No immediate complications. Estimated blood loss:                            None. Estimated Blood Loss:     Estimated blood loss: none. Impression:               - Mild melanosis in the colon.                           - Internal hemorrhoids.                           - The examination was otherwise normal on direct                            and retroflexion views.                           - No specimens collected. Recommendation:           - Patient has a contact number available for                            emergencies. The signs and symptoms of potential                            delayed complications were discussed with the                             patient. Return to normal activities tomorrow.                            Written discharge instructions were provided to the                            patient.                           - Resume previous diet.                           - Continue present medications.                           - No repeat colonoscopy due to age. Ladene Artist, MD 05/21/2019 11:59:05 AM This report has been signed electronically.

## 2019-05-21 NOTE — Progress Notes (Signed)
VS- CW Temp-LC  Pt's states no medical or surgical changes since previsit or office visit.

## 2019-05-21 NOTE — Progress Notes (Signed)
Report to PACU, RN, vss, BBS= Clear.  

## 2019-05-21 NOTE — Patient Instructions (Signed)
HANDOUTS PROVIDED ON: HEMORRHOIDS   YOU MAY RESUME YOUR PREVIOUS DIET AND MEDICATION SCHEDULE.  [place] YOU FOR ALLOWING [place] TO CARE FOR YOU TODAY!!!  YOU HAD AN ENDOSCOPIC PROCEDURE TODAY AT [facility]:   Refer to the procedure report that was given to you for any specific questions about what was found during the examination.  If the procedure report does not answer your questions, please call your gastroenterologist to clarify.  If you requested that your care partner not be given the details of your procedure findings, then the procedure report has been included in a sealed envelope for you to review at your convenience later.  YOU SHOULD EXPECT: Some feelings of bloating in the abdomen. Passage of more gas than usual.  Walking can help get rid of the air that was put into your GI tract during the procedure and reduce the bloating. If you had a lower endoscopy (such as a colonoscopy or flexible sigmoidoscopy) you may notice spotting of blood in your stool or on the toilet paper. If you underwent a bowel prep for your procedure, you may not have a normal bowel movement for a few days.  Please Note:  You might notice some irritation and congestion in your nose or some drainage.  This is from the oxygen used during your procedure.  There is no need for concern and it should clear up in a day or so.  SYMPTOMS TO REPORT IMMEDIATELY:   Following lower endoscopy (colonoscopy or flexible sigmoidoscopy):  Excessive amounts of blood in the stool  Significant tenderness or worsening of abdominal pains  Swelling of the abdomen that is new, acute  Fever of 100F or higher  For urgent or emergent issues, a gastroenterologist can be reached at any hour by calling [phone].   DIET:  We do recommend a small meal at first, but then you may proceed to your regular diet.  Drink plenty of fluids but you should avoid alcoholic beverages for 24 hours.  ACTIVITY:  You should plan to take  it easy for the rest of today and you should NOT DRIVE or use heavy machinery until tomorrow (because of the sedation medicines used during the test).    FOLLOW UP: Our staff will call the number listed on your records 48-72 hours following your procedure to check on you and address any questions or concerns that you may have regarding the information given to you following your procedure. If we do not reach you, we will leave a message.  We will attempt to reach you two times.  During this call, we will ask if you have developed any symptoms of COVID 19. If you develop any symptoms (ie: fever, flu-like symptoms, shortness of breath, cough etc.) before then, please call [phone].  If you test positive for Covid 19 in the 2 weeks post procedure, please call and report this information to [place].    If any biopsies were taken you will be contacted by phone or by letter within the next 1-3 weeks.  Please call us at [phone] if you have not heard about the biopsies in 3 weeks.    SIGNATURES/CONFIDENTIALITY: You and/or your care partner have signed paperwork which will be entered into your electronic medical record.  These signatures attest to the fact that that the information above on your After Visit Summary has been reviewed and is understood.  Full responsibility of the confidentiality of this discharge information lies with you and/or your care-partner.

## 2019-05-23 ENCOUNTER — Telehealth: Payer: Self-pay | Admitting: *Deleted

## 2019-05-23 NOTE — Telephone Encounter (Signed)
  Follow up Call-  Call back number 05/21/2019  Post procedure Call Back phone  # [phone]  Permission to leave phone message Yes  Some recent data might be hidden     Patient questions:  Do you have a fever, pain , or abdominal swelling? No. Pain Score  0 *  Have you tolerated food without any problems? Yes.    Have you been able to return to your normal activities? Yes.    Do you have any questions about your discharge instructions: Diet   No. Medications  No. Follow up visit  No.  Do you have questions or concerns about your Care? No.  Actions: * If pain score is 4 or above: No action needed, pain <4.  1. Have you developed a fever since your procedure? no  2.   Have you had an respiratory symptoms (SOB or cough) since your procedure? no  3.   Have you tested positive for COVID 19 since your procedure no  4.   Have you had any family members/close contacts diagnosed with the COVID 19 since your procedure?  no   If yes to any of these questions please route to Joylene John, RN and Alphonsa Gin, Therapist, sports.

## 2019-06-12 ENCOUNTER — Emergency Department (HOSPITAL_COMMUNITY): Payer: Medicare HMO

## 2019-06-12 ENCOUNTER — Encounter (HOSPITAL_COMMUNITY): Payer: Self-pay | Admitting: Internal Medicine

## 2019-06-12 ENCOUNTER — Inpatient Hospital Stay (HOSPITAL_COMMUNITY)
Admission: EM | Admit: 2019-06-12 | Discharge: 2019-07-22 | DRG: 207 | Disposition: E | Payer: Medicare HMO | Attending: Pulmonary Disease | Admitting: Pulmonary Disease

## 2019-06-12 DIAGNOSIS — I129 Hypertensive chronic kidney disease with stage 1 through stage 4 chronic kidney disease, or unspecified chronic kidney disease: Secondary | ICD-10-CM

## 2019-06-12 DIAGNOSIS — R9431 Abnormal electrocardiogram [ECG] [EKG]: Secondary | ICD-10-CM | POA: Diagnosis not present

## 2019-06-12 DIAGNOSIS — Z9289 Personal history of other medical treatment: Secondary | ICD-10-CM

## 2019-06-12 DIAGNOSIS — R6521 Severe sepsis with septic shock: Secondary | ICD-10-CM | POA: Diagnosis not present

## 2019-06-12 DIAGNOSIS — Z23 Encounter for immunization: Secondary | ICD-10-CM | POA: Diagnosis not present

## 2019-06-12 DIAGNOSIS — K921 Melena: Secondary | ICD-10-CM | POA: Diagnosis not present

## 2019-06-12 DIAGNOSIS — Z452 Encounter for adjustment and management of vascular access device: Secondary | ICD-10-CM

## 2019-06-12 DIAGNOSIS — I69354 Hemiplegia and hemiparesis following cerebral infarction affecting left non-dominant side: Secondary | ICD-10-CM

## 2019-06-12 DIAGNOSIS — R7989 Other specified abnormal findings of blood chemistry: Secondary | ICD-10-CM | POA: Diagnosis not present

## 2019-06-12 DIAGNOSIS — R509 Fever, unspecified: Secondary | ICD-10-CM | POA: Diagnosis not present

## 2019-06-12 DIAGNOSIS — I82812 Embolism and thrombosis of superficial veins of left lower extremities: Secondary | ICD-10-CM | POA: Diagnosis not present

## 2019-06-12 DIAGNOSIS — G931 Anoxic brain damage, not elsewhere classified: Secondary | ICD-10-CM | POA: Diagnosis not present

## 2019-06-12 DIAGNOSIS — Z794 Long term (current) use of insulin: Secondary | ICD-10-CM

## 2019-06-12 DIAGNOSIS — K567 Ileus, unspecified: Secondary | ICD-10-CM

## 2019-06-12 DIAGNOSIS — T383X6A Underdosing of insulin and oral hypoglycemic [antidiabetic] drugs, initial encounter: Secondary | ICD-10-CM | POA: Diagnosis present

## 2019-06-12 DIAGNOSIS — J189 Pneumonia, unspecified organism: Secondary | ICD-10-CM

## 2019-06-12 DIAGNOSIS — R0902 Hypoxemia: Secondary | ICD-10-CM

## 2019-06-12 DIAGNOSIS — D6959 Other secondary thrombocytopenia: Secondary | ICD-10-CM | POA: Diagnosis not present

## 2019-06-12 DIAGNOSIS — Z4682 Encounter for fitting and adjustment of non-vascular catheter: Secondary | ICD-10-CM | POA: Diagnosis not present

## 2019-06-12 DIAGNOSIS — G8194 Hemiplegia, unspecified affecting left nondominant side: Secondary | ICD-10-CM | POA: Diagnosis present

## 2019-06-12 DIAGNOSIS — I639 Cerebral infarction, unspecified: Secondary | ICD-10-CM | POA: Diagnosis not present

## 2019-06-12 DIAGNOSIS — J9601 Acute respiratory failure with hypoxia: Secondary | ICD-10-CM

## 2019-06-12 DIAGNOSIS — Z9114 Patient's other noncompliance with medication regimen: Secondary | ICD-10-CM

## 2019-06-12 DIAGNOSIS — E1140 Type 2 diabetes mellitus with diabetic neuropathy, unspecified: Secondary | ICD-10-CM | POA: Diagnosis not present

## 2019-06-12 DIAGNOSIS — E876 Hypokalemia: Secondary | ICD-10-CM | POA: Diagnosis not present

## 2019-06-12 DIAGNOSIS — U071 COVID-19: Secondary | ICD-10-CM | POA: Diagnosis not present

## 2019-06-12 DIAGNOSIS — E7800 Pure hypercholesterolemia, unspecified: Secondary | ICD-10-CM | POA: Diagnosis not present

## 2019-06-12 DIAGNOSIS — J9621 Acute and chronic respiratory failure with hypoxia: Secondary | ICD-10-CM | POA: Diagnosis present

## 2019-06-12 DIAGNOSIS — R1310 Dysphagia, unspecified: Secondary | ICD-10-CM

## 2019-06-12 DIAGNOSIS — E870 Hyperosmolality and hypernatremia: Secondary | ICD-10-CM | POA: Diagnosis present

## 2019-06-12 DIAGNOSIS — J982 Interstitial emphysema: Secondary | ICD-10-CM | POA: Diagnosis not present

## 2019-06-12 DIAGNOSIS — I82451 Acute embolism and thrombosis of right peroneal vein: Secondary | ICD-10-CM | POA: Diagnosis not present

## 2019-06-12 DIAGNOSIS — R140 Abdominal distension (gaseous): Secondary | ICD-10-CM | POA: Diagnosis not present

## 2019-06-12 DIAGNOSIS — I6389 Other cerebral infarction: Secondary | ICD-10-CM | POA: Diagnosis not present

## 2019-06-12 DIAGNOSIS — R0602 Shortness of breath: Secondary | ICD-10-CM | POA: Diagnosis not present

## 2019-06-12 DIAGNOSIS — I120 Hypertensive chronic kidney disease with stage 5 chronic kidney disease or end stage renal disease: Secondary | ICD-10-CM | POA: Diagnosis present

## 2019-06-12 DIAGNOSIS — Z6822 Body mass index (BMI) 22.0-22.9, adult: Secondary | ICD-10-CM

## 2019-06-12 DIAGNOSIS — J1282 Pneumonia due to coronavirus disease 2019: Secondary | ICD-10-CM | POA: Diagnosis present

## 2019-06-12 DIAGNOSIS — J9811 Atelectasis: Secondary | ICD-10-CM | POA: Diagnosis not present

## 2019-06-12 DIAGNOSIS — R197 Diarrhea, unspecified: Secondary | ICD-10-CM | POA: Diagnosis not present

## 2019-06-12 DIAGNOSIS — E1165 Type 2 diabetes mellitus with hyperglycemia: Secondary | ICD-10-CM | POA: Diagnosis not present

## 2019-06-12 DIAGNOSIS — J939 Pneumothorax, unspecified: Secondary | ICD-10-CM | POA: Diagnosis not present

## 2019-06-12 DIAGNOSIS — K560 Paralytic ileus: Secondary | ICD-10-CM | POA: Diagnosis not present

## 2019-06-12 DIAGNOSIS — J80 Acute respiratory distress syndrome: Secondary | ICD-10-CM | POA: Diagnosis present

## 2019-06-12 DIAGNOSIS — R58 Hemorrhage, not elsewhere classified: Secondary | ICD-10-CM | POA: Diagnosis not present

## 2019-06-12 DIAGNOSIS — E43 Unspecified severe protein-calorie malnutrition: Secondary | ICD-10-CM | POA: Diagnosis not present

## 2019-06-12 DIAGNOSIS — N183 Chronic kidney disease, stage 3 unspecified: Secondary | ICD-10-CM | POA: Diagnosis not present

## 2019-06-12 DIAGNOSIS — Z4659 Encounter for fitting and adjustment of other gastrointestinal appliance and device: Secondary | ICD-10-CM

## 2019-06-12 DIAGNOSIS — Y92009 Unspecified place in unspecified non-institutional (private) residence as the place of occurrence of the external cause: Secondary | ICD-10-CM

## 2019-06-12 DIAGNOSIS — D631 Anemia in chronic kidney disease: Secondary | ICD-10-CM | POA: Diagnosis present

## 2019-06-12 DIAGNOSIS — Z9911 Dependence on respirator [ventilator] status: Secondary | ICD-10-CM

## 2019-06-12 DIAGNOSIS — E1142 Type 2 diabetes mellitus with diabetic polyneuropathy: Secondary | ICD-10-CM | POA: Diagnosis not present

## 2019-06-12 DIAGNOSIS — J1289 Other viral pneumonia: Secondary | ICD-10-CM

## 2019-06-12 DIAGNOSIS — I469 Cardiac arrest, cause unspecified: Secondary | ICD-10-CM | POA: Diagnosis not present

## 2019-06-12 DIAGNOSIS — R0689 Other abnormalities of breathing: Secondary | ICD-10-CM | POA: Diagnosis not present

## 2019-06-12 DIAGNOSIS — Z91128 Patient's intentional underdosing of medication regimen for other reason: Secondary | ICD-10-CM

## 2019-06-12 DIAGNOSIS — I252 Old myocardial infarction: Secondary | ICD-10-CM

## 2019-06-12 DIAGNOSIS — K559 Vascular disorder of intestine, unspecified: Secondary | ICD-10-CM | POA: Diagnosis not present

## 2019-06-12 DIAGNOSIS — Z66 Do not resuscitate: Secondary | ICD-10-CM | POA: Diagnosis not present

## 2019-06-12 DIAGNOSIS — K219 Gastro-esophageal reflux disease without esophagitis: Secondary | ICD-10-CM | POA: Diagnosis present

## 2019-06-12 DIAGNOSIS — Z4901 Encounter for fitting and adjustment of extracorporeal dialysis catheter: Secondary | ICD-10-CM | POA: Diagnosis not present

## 2019-06-12 DIAGNOSIS — Z931 Gastrostomy status: Secondary | ICD-10-CM

## 2019-06-12 DIAGNOSIS — I6782 Cerebral ischemia: Secondary | ICD-10-CM | POA: Diagnosis present

## 2019-06-12 DIAGNOSIS — J930 Spontaneous tension pneumothorax: Secondary | ICD-10-CM

## 2019-06-12 DIAGNOSIS — N186 End stage renal disease: Secondary | ICD-10-CM

## 2019-06-12 DIAGNOSIS — E1122 Type 2 diabetes mellitus with diabetic chronic kidney disease: Secondary | ICD-10-CM | POA: Diagnosis not present

## 2019-06-12 DIAGNOSIS — R4182 Altered mental status, unspecified: Secondary | ICD-10-CM | POA: Diagnosis not present

## 2019-06-12 DIAGNOSIS — I10 Essential (primary) hypertension: Secondary | ICD-10-CM | POA: Diagnosis present

## 2019-06-12 DIAGNOSIS — K9289 Other specified diseases of the digestive system: Secondary | ICD-10-CM

## 2019-06-12 DIAGNOSIS — Z515 Encounter for palliative care: Secondary | ICD-10-CM | POA: Diagnosis not present

## 2019-06-12 DIAGNOSIS — Z209 Contact with and (suspected) exposure to unspecified communicable disease: Secondary | ICD-10-CM | POA: Diagnosis not present

## 2019-06-12 DIAGNOSIS — E785 Hyperlipidemia, unspecified: Secondary | ICD-10-CM | POA: Diagnosis not present

## 2019-06-12 DIAGNOSIS — N170 Acute kidney failure with tubular necrosis: Secondary | ICD-10-CM | POA: Diagnosis not present

## 2019-06-12 DIAGNOSIS — N179 Acute kidney failure, unspecified: Secondary | ICD-10-CM

## 2019-06-12 DIAGNOSIS — D689 Coagulation defect, unspecified: Secondary | ICD-10-CM | POA: Diagnosis not present

## 2019-06-12 DIAGNOSIS — J9690 Respiratory failure, unspecified, unspecified whether with hypoxia or hypercapnia: Secondary | ICD-10-CM

## 2019-06-12 DIAGNOSIS — T8241XA Breakdown (mechanical) of vascular dialysis catheter, initial encounter: Secondary | ICD-10-CM

## 2019-06-12 DIAGNOSIS — E872 Acidosis: Secondary | ICD-10-CM | POA: Diagnosis not present

## 2019-06-12 DIAGNOSIS — N184 Chronic kidney disease, stage 4 (severe): Secondary | ICD-10-CM | POA: Diagnosis not present

## 2019-06-12 DIAGNOSIS — A4189 Other specified sepsis: Secondary | ICD-10-CM | POA: Diagnosis not present

## 2019-06-12 DIAGNOSIS — G9341 Metabolic encephalopathy: Secondary | ICD-10-CM | POA: Diagnosis not present

## 2019-06-12 DIAGNOSIS — Z7982 Long term (current) use of aspirin: Secondary | ICD-10-CM

## 2019-06-12 DIAGNOSIS — J690 Pneumonitis due to inhalation of food and vomit: Secondary | ICD-10-CM | POA: Diagnosis not present

## 2019-06-12 DIAGNOSIS — R64 Cachexia: Secondary | ICD-10-CM | POA: Diagnosis present

## 2019-06-12 DIAGNOSIS — D62 Acute posthemorrhagic anemia: Secondary | ICD-10-CM | POA: Diagnosis not present

## 2019-06-12 DIAGNOSIS — I953 Hypotension of hemodialysis: Secondary | ICD-10-CM | POA: Diagnosis not present

## 2019-06-12 DIAGNOSIS — Z01818 Encounter for other preprocedural examination: Secondary | ICD-10-CM

## 2019-06-12 DIAGNOSIS — I2510 Atherosclerotic heart disease of native coronary artery without angina pectoris: Secondary | ICD-10-CM | POA: Diagnosis present

## 2019-06-12 DIAGNOSIS — T82838A Hemorrhage of vascular prosthetic devices, implants and grafts, initial encounter: Secondary | ICD-10-CM | POA: Diagnosis not present

## 2019-06-12 DIAGNOSIS — E875 Hyperkalemia: Secondary | ICD-10-CM | POA: Diagnosis not present

## 2019-06-12 DIAGNOSIS — Z79899 Other long term (current) drug therapy: Secondary | ICD-10-CM

## 2019-06-12 LAB — BASIC METABOLIC PANEL
Anion gap: 14 (ref 5–15)
BUN: 48 mg/dL — ABNORMAL HIGH (ref 8–23)
CO2: 22 mmol/L (ref 22–32)
Calcium: 7.8 mg/dL — ABNORMAL LOW (ref 8.9–10.3)
Chloride: 108 mmol/L (ref 98–111)
Creatinine, Ser: 4.24 mg/dL — ABNORMAL HIGH (ref 0.61–1.24)
GFR calc Af Amer: 15 mL/min — ABNORMAL LOW (ref >60)
GFR calc non Af Amer: 13 mL/min — ABNORMAL LOW (ref >60)
Glucose, Bld: 440 mg/dL — ABNORMAL HIGH (ref 70–99)
Potassium: 3.5 mmol/L (ref 3.5–5.1)
Sodium: 144 mmol/L (ref 135–145)

## 2019-06-12 LAB — FERRITIN: Ferritin: 830 ng/mL — ABNORMAL HIGH (ref 24–336)

## 2019-06-12 LAB — URINALYSIS, ROUTINE W REFLEX MICROSCOPIC
Bilirubin Urine: NEGATIVE
Glucose, UA: >=500 mg/dL — AB
Ketones, ur: NEGATIVE mg/dL
Leukocytes,Ua: NEGATIVE
Nitrite: NEGATIVE
Protein, ur: 100 mg/dL — AB
Specific Gravity, Urine: 1.018 (ref 1.005–1.030)
pH: 5 (ref 5.0–8.0)

## 2019-06-12 LAB — COMPREHENSIVE METABOLIC PANEL WITH GFR
ALT: 24 U/L (ref 0–44)
AST: 35 U/L (ref 15–41)
Albumin: 2 g/dL — ABNORMAL LOW (ref 3.5–5.0)
Alkaline Phosphatase: 72 U/L (ref 38–126)
Anion gap: 15 (ref 5–15)
BUN: 51 mg/dL — ABNORMAL HIGH (ref 8–23)
CO2: 23 mmol/L (ref 22–32)
Calcium: 8 mg/dL — ABNORMAL LOW (ref 8.9–10.3)
Chloride: 104 mmol/L (ref 98–111)
Creatinine, Ser: 4.67 mg/dL — ABNORMAL HIGH (ref 0.61–1.24)
GFR calc Af Amer: 13 mL/min — ABNORMAL LOW
GFR calc non Af Amer: 11 mL/min — ABNORMAL LOW
Glucose, Bld: 554 mg/dL (ref 70–99)
Potassium: 3.4 mmol/L — ABNORMAL LOW (ref 3.5–5.1)
Sodium: 142 mmol/L (ref 135–145)
Total Bilirubin: 1.2 mg/dL (ref 0.3–1.2)
Total Protein: 7.1 g/dL (ref 6.5–8.1)

## 2019-06-12 LAB — TRIGLYCERIDES: Triglycerides: 56 mg/dL (ref <150)

## 2019-06-12 LAB — CBC WITH DIFFERENTIAL/PLATELET
Abs Immature Granulocytes: 0.1 10*3/uL — ABNORMAL HIGH (ref 0.00–0.07)
Basophils Absolute: 0 10*3/uL (ref 0.0–0.1)
Basophils Relative: 0 %
Eosinophils Absolute: 0 10*3/uL (ref 0.0–0.5)
Eosinophils Relative: 0 %
HCT: 42.2 % (ref 39.0–52.0)
Hemoglobin: 12.6 g/dL — ABNORMAL LOW (ref 13.0–17.0)
Immature Granulocytes: 1 %
Lymphocytes Relative: 6 %
Lymphs Abs: 0.6 10*3/uL — ABNORMAL LOW (ref 0.7–4.0)
MCH: 25.7 pg — ABNORMAL LOW (ref 26.0–34.0)
MCHC: 29.9 g/dL — ABNORMAL LOW (ref 30.0–36.0)
MCV: 86.1 fL (ref 80.0–100.0)
Monocytes Absolute: 0.5 10*3/uL (ref 0.1–1.0)
Monocytes Relative: 5 %
Neutro Abs: 7.7 10*3/uL (ref 1.7–7.7)
Neutrophils Relative %: 88 %
Platelets: 179 10*3/uL (ref 150–400)
RBC: 4.9 MIL/uL (ref 4.22–5.81)
RDW: 15.3 % (ref 11.5–15.5)
WBC: 8.9 10*3/uL (ref 4.0–10.5)
nRBC: 0 % (ref 0.0–0.2)

## 2019-06-12 LAB — POC SARS CORONAVIRUS 2 AG -  ED: SARS Coronavirus 2 Ag: POSITIVE — AB

## 2019-06-12 LAB — C-REACTIVE PROTEIN: CRP: 17.2 mg/dL — ABNORMAL HIGH (ref <1.0)

## 2019-06-12 LAB — FIBRINOGEN: Fibrinogen: 304 mg/dL (ref 210–475)

## 2019-06-12 LAB — PROCALCITONIN: Procalcitonin: 1.35 ng/mL

## 2019-06-12 LAB — LACTIC ACID, PLASMA
Lactic Acid, Venous: 2.3 mmol/L (ref 0.5–1.9)
Lactic Acid, Venous: 2.2 mmol/L (ref 0.5–1.9)

## 2019-06-12 LAB — D-DIMER, QUANTITATIVE: D-Dimer, Quant: >20 ug/mL-FEU — ABNORMAL HIGH (ref 0.00–0.50)

## 2019-06-12 LAB — CBG MONITORING, ED
Glucose-Capillary: 383 mg/dL — ABNORMAL HIGH (ref 70–99)
Glucose-Capillary: 330 mg/dL — ABNORMAL HIGH (ref 70–99)

## 2019-06-12 LAB — LACTATE DEHYDROGENASE: LDH: 396 U/L — ABNORMAL HIGH (ref 98–192)

## 2019-06-12 LAB — ABO/RH: ABO/RH(D): O POS

## 2019-06-12 MED ORDER — SODIUM CHLORIDE 0.9 % IV BOLUS
1000.0000 mL | Freq: Once | INTRAVENOUS | Status: AC
  Administered 2019-06-12: 17:00:00 1000 mL via INTRAVENOUS

## 2019-06-12 MED ORDER — INSULIN ASPART 100 UNIT/ML ~~LOC~~ SOLN
0.0000 [IU] | SUBCUTANEOUS | Status: DC
  Administered 2019-06-12: 19:00:00 9 [IU] via SUBCUTANEOUS
  Administered 2019-06-13: 1 [IU] via SUBCUTANEOUS
  Administered 2019-06-13: 3 [IU] via SUBCUTANEOUS
  Administered 2019-06-13: 5 [IU] via SUBCUTANEOUS
  Administered 2019-06-13: 9 [IU] via SUBCUTANEOUS
  Administered 2019-06-13 (×2): 2 [IU] via SUBCUTANEOUS
  Administered 2019-06-14: 21:00:00 1 [IU] via SUBCUTANEOUS
  Administered 2019-06-14: 2 [IU] via SUBCUTANEOUS
  Administered 2019-06-14: 09:00:00 1 [IU] via SUBCUTANEOUS
  Administered 2019-06-14: 2 [IU] via SUBCUTANEOUS
  Administered 2019-06-14 (×2): 3 [IU] via SUBCUTANEOUS
  Administered 2019-06-15: 7 [IU] via SUBCUTANEOUS
  Administered 2019-06-15: 04:00:00 4 [IU] via SUBCUTANEOUS

## 2019-06-12 MED ORDER — ALBUTEROL SULFATE HFA 108 (90 BASE) MCG/ACT IN AERS
2.0000 | INHALATION_SPRAY | Freq: Four times a day (QID) | RESPIRATORY_TRACT | Status: DC
  Administered 2019-06-12 – 2019-06-24 (×39): 2 via RESPIRATORY_TRACT
  Filled 2019-06-12: qty 6.7

## 2019-06-12 MED ORDER — SODIUM CHLORIDE 0.9 % IV SOLN
200.0000 mg | Freq: Once | INTRAVENOUS | Status: AC
  Administered 2019-06-12: 200 mg via INTRAVENOUS
  Filled 2019-06-12: qty 200

## 2019-06-12 MED ORDER — PNEUMOCOCCAL VAC POLYVALENT 25 MCG/0.5ML IJ INJ
0.5000 mL | INJECTION | INTRAMUSCULAR | Status: AC
  Administered 2019-06-13: 16:00:00 0.5 mL via INTRAMUSCULAR
  Filled 2019-06-12: qty 0.5

## 2019-06-12 MED ORDER — INSULIN ASPART PROT & ASPART (70-30 MIX) 100 UNIT/ML ~~LOC~~ SUSP
25.0000 [IU] | Freq: Two times a day (BID) | SUBCUTANEOUS | Status: DC
  Administered 2019-06-12 – 2019-06-17 (×11): 25 [IU] via SUBCUTANEOUS
  Filled 2019-06-12 (×3): qty 10

## 2019-06-12 MED ORDER — ONDANSETRON HCL 4 MG PO TABS: 4.0000 mg | ORAL_TABLET | Freq: Four times a day (QID) | ORAL | Status: DC | PRN

## 2019-06-12 MED ORDER — AMLODIPINE BESYLATE 10 MG PO TABS
10.0000 mg | ORAL_TABLET | Freq: Every day | ORAL | Status: DC
  Administered 2019-06-12 – 2019-06-18 (×7): 10 mg via ORAL
  Filled 2019-06-12: qty 1
  Filled 2019-06-12: qty 2
  Filled 2019-06-12 (×2): qty 1
  Filled 2019-06-12: qty 2
  Filled 2019-06-12 (×2): qty 1

## 2019-06-12 MED ORDER — HEPARIN SODIUM (PORCINE) 5000 UNIT/ML IJ SOLN
7500.0000 [IU] | Freq: Three times a day (TID) | INTRAMUSCULAR | Status: DC
  Administered 2019-06-12 – 2019-06-13 (×3): 7500 [IU] via SUBCUTANEOUS
  Filled 2019-06-12 (×4): qty 2

## 2019-06-12 MED ORDER — CARVEDILOL 3.125 MG PO TABS
6.2500 mg | ORAL_TABLET | Freq: Two times a day (BID) | ORAL | Status: DC
  Administered 2019-06-12 – 2019-06-16 (×8): 6.25 mg via ORAL
  Filled 2019-06-12 (×4): qty 2
  Filled 2019-06-12: qty 1
  Filled 2019-06-12 (×4): qty 2

## 2019-06-12 MED ORDER — GUAIFENESIN-DM 100-10 MG/5ML PO SYRP
10.0000 mL | ORAL_SOLUTION | ORAL | Status: DC | PRN
  Filled 2019-06-12: qty 10

## 2019-06-12 MED ORDER — SODIUM CHLORIDE 0.9 % IV BOLUS
1000.0000 mL | Freq: Once | INTRAVENOUS | Status: AC
  Administered 2019-06-12: 16:00:00 1000 mL via INTRAVENOUS

## 2019-06-12 MED ORDER — ROSUVASTATIN CALCIUM 20 MG PO TABS
40.0000 mg | ORAL_TABLET | Freq: Every day | ORAL | Status: DC
  Administered 2019-06-12 – 2019-06-26 (×9): 40 mg via ORAL
  Filled 2019-06-12: qty 8
  Filled 2019-06-12 (×2): qty 2
  Filled 2019-06-12: qty 8
  Filled 2019-06-12 (×3): qty 2
  Filled 2019-06-12: qty 8
  Filled 2019-06-12 (×3): qty 2
  Filled 2019-06-12: qty 8

## 2019-06-12 MED ORDER — POTASSIUM CHLORIDE 10 MEQ/100ML IV SOLN
10.0000 meq | INTRAVENOUS | Status: AC
  Administered 2019-06-12 (×3): 10 meq via INTRAVENOUS
  Filled 2019-06-12 (×3): qty 100

## 2019-06-12 MED ORDER — SODIUM CHLORIDE 0.9 % IV SOLN
2.0000 g | INTRAVENOUS | Status: DC
  Administered 2019-06-12: 18:00:00 2 g via INTRAVENOUS
  Filled 2019-06-12: qty 20

## 2019-06-12 MED ORDER — ONDANSETRON HCL 4 MG/2ML IJ SOLN: 4.0000 mg | Freq: Four times a day (QID) | INTRAMUSCULAR | Status: DC | PRN

## 2019-06-12 MED ORDER — SODIUM CHLORIDE 0.9 % IV SOLN
100.0000 mg | Freq: Two times a day (BID) | INTRAVENOUS | Status: DC
  Administered 2019-06-12 – 2019-06-13 (×2): 100 mg via INTRAVENOUS
  Filled 2019-06-12 (×3): qty 100

## 2019-06-12 MED ORDER — INFLUENZA VAC A&B SA ADJ QUAD 0.5 ML IM PRSY
0.5000 mL | PREFILLED_SYRINGE | INTRAMUSCULAR | Status: AC
  Administered 2019-06-13: 17:00:00 0.5 mL via INTRAMUSCULAR
  Filled 2019-06-12: qty 0.5

## 2019-06-12 MED ORDER — ACETAMINOPHEN 325 MG PO TABS
650.0000 mg | ORAL_TABLET | Freq: Four times a day (QID) | ORAL | Status: DC | PRN
  Administered 2019-06-13: 650 mg via ORAL
  Filled 2019-06-12: qty 2

## 2019-06-12 MED ORDER — SODIUM CHLORIDE 0.9 % IV SOLN
100.0000 mg | Freq: Every day | INTRAVENOUS | Status: AC
  Administered 2019-06-13 – 2019-06-16 (×4): 100 mg via INTRAVENOUS
  Filled 2019-06-12 (×4): qty 20

## 2019-06-12 MED ORDER — ZINC SULFATE 220 (50 ZN) MG PO CAPS
220.0000 mg | ORAL_CAPSULE | Freq: Every day | ORAL | Status: DC
  Administered 2019-06-12 – 2019-06-26 (×9): 220 mg via ORAL
  Filled 2019-06-12 (×10): qty 1

## 2019-06-12 MED ORDER — SODIUM CHLORIDE 0.9% FLUSH
3.0000 mL | Freq: Two times a day (BID) | INTRAVENOUS | Status: DC
  Administered 2019-06-12 – 2019-07-01 (×36): 3 mL via INTRAVENOUS

## 2019-06-12 MED ORDER — SODIUM CHLORIDE 0.9 % IV SOLN: INTRAVENOUS | Status: DC

## 2019-06-12 MED ORDER — DEXAMETHASONE SODIUM PHOSPHATE 10 MG/ML IJ SOLN
6.0000 mg | INTRAMUSCULAR | Status: DC
  Administered 2019-06-12 – 2019-06-14 (×3): 6 mg via INTRAVENOUS
  Filled 2019-06-12 (×3): qty 1

## 2019-06-12 MED ORDER — FAMOTIDINE IN NACL 20-0.9 MG/50ML-% IV SOLN
20.0000 mg | Freq: Every day | INTRAVENOUS | Status: DC
  Administered 2019-06-12 – 2019-06-24 (×12): 20 mg via INTRAVENOUS
  Filled 2019-06-12 (×17): qty 50

## 2019-06-12 MED ORDER — INSULIN ASPART 100 UNIT/ML ~~LOC~~ SOLN
4.0000 [IU] | Freq: Once | SUBCUTANEOUS | Status: AC
  Administered 2019-06-12: 4 [IU] via INTRAVENOUS

## 2019-06-12 MED ORDER — ASCORBIC ACID 500 MG PO TABS
500.0000 mg | ORAL_TABLET | Freq: Every day | ORAL | Status: DC
  Administered 2019-06-12 – 2019-06-26 (×9): 500 mg via ORAL
  Filled 2019-06-12 (×10): qty 1

## 2019-06-12 MED ORDER — HYDROCOD POLST-CPM POLST ER 10-8 MG/5ML PO SUER
5.0000 mL | Freq: Two times a day (BID) | ORAL | Status: DC | PRN
  Administered 2019-06-16: 5 mL via ORAL
  Filled 2019-06-12: qty 5

## 2019-06-12 NOTE — ED Notes (Signed)
[facility] called by dee they will be here sometimes after 7pm shift

## 2019-06-12 NOTE — ED Triage Notes (Signed)
Pt presents to the ED via [facility]. Per EMS patient's wife is positive for COVID and he lives with her at home. Pt reports feeling short of breath with fevers and increased lethargy x1 week. EMS reports patient's SpO2 on room air was 81%. Pt arrived 100% on NRB.

## 2019-06-12 NOTE — H&P (Addendum)
History and Physical    Steve Martinez [id number] DOB: 1940/10/22 DOA: 06/03/2019  Referring MD/NP/PA: Providence Lanius, PA-C PCP: Gaynelle Arabian, MD  Patient coming from: Home via EMS  Chief Complaint: Shortness of breath  I have personally briefly reviewed patient's old medical records in [place]   HPI: Steve Martinez is a 78 y.o. male with medical history significant of hypertension, hyperlipidemia, diabetes mellitus type 2, GERD, CVA with left-sided weakness.  He presents with complaints of progressively worsening shortness of breath over the last week.  Sick contacts include his wife who tested positive for Covid last week. Described heaviness on his chest. Associated symptoms include sneezing, intermittent productive cough, fever, loss of appetite with poor p.o. intake, loss of balance, whole body myalgias, generalized weakness, and fatigue.  He had stopped taking his insulin due to him not eating and drinking like normal.  Denies having any significant headache.  In route with EMS patient was noted to be febrile up to 103.6 F with O2 saturations 81% on room air and he was placed on a non rebreather . ED Course: Upon admission into the emergency department patient was seen to be afebrile with O2 saturations 100% on 5 L nasal cannula oxygen.  Labs significant for potassium 3.4, BUN 51, creatinine 4.67, glucose 554, lactic acid 2.3, procalcitonin 1.35, ferritin 830, CRP 17.2, and D-dimer greater than 20.  Point-of-care COVID-19 screening was positive.  Blood cultures were obtained.  Patient was given 1 L normal saline IV fluids and 4 units of insulin.  Review of Systems  Constitutional: Positive for fever and malaise/fatigue.  HENT: Negative for ear discharge and nosebleeds.   Eyes: Negative for double vision and photophobia.  Respiratory: Positive for cough and shortness of breath.   Cardiovascular: Negative for chest pain and leg swelling.  Gastrointestinal: Negative for  abdominal pain, constipation and diarrhea.  Genitourinary: Negative for dysuria and hematuria.  Musculoskeletal: Positive for myalgias. Negative for falls.  Neurological: Positive for weakness. Negative for loss of consciousness and headaches.  Psychiatric/Behavioral: Negative for memory loss and substance abuse.    Past Medical History:  Diagnosis Date  . Back pain    occasionally d/t pulled muscle  . Cataracts, bilateral   . Diabetes mellitus    lantus bid  . GERD (gastroesophageal reflux disease)   . Hemorrhoids   . History of bronchitis    last year  . Hyperlipidemia    takes Zocor daily  . Hypertension    takes Diltiazem,Maxzide,and Quinapril daily  . Myocardial infarction ([place]) 17-9yrs ago  . Stroke ([place])    x 3;left side weaker    Past Surgical History:  Procedure Laterality Date  . CARDIAC CATHETERIZATION  17-4yrs ago  . CATARACT EXTRACTION W/PHACO  02/08/2012   Procedure: CATARACT EXTRACTION PHACO AND INTRAOCULAR LENS PLACEMENT (IOC);  Surgeon: Adonis Brook, MD;  Location: [facility];  Service: Ophthalmology;  Laterality: Right;  . COLONOSCOPY    . REFRACTIVE SURGERY    . SHOULDER SURGERY Left      reports that he has never smoked. He has never used smokeless tobacco. He reports that he does not drink alcohol or use drugs.  No Known Allergies  Family History  Problem Relation Age of Onset  . Colon cancer Neg Hx   . Esophageal cancer Neg Hx   . Rectal cancer Neg Hx   . Stomach cancer Neg Hx     Prior to Admission medications   Medication Sig Start Date End Date Taking? Authorizing  Provider  ACCU-CHEK AVIVA PLUS test strip  03/18/19   [provider]  amLODipine (NORVASC) 10 MG tablet Take 10 mg by mouth daily.    [provider]  aspirin EC 325 MG tablet Take 325 mg by mouth daily.    [provider]  BD PEN NEEDLE NANO 2ND GEN 32G X 4 MM MISC  04/09/19   [provider]  carvedilol (COREG) 6.25 MG tablet Take 6.25 mg by  mouth 2 (two) times daily with a meal.    [provider]  furosemide (LASIX) 40 MG tablet Take 40 mg by mouth daily.    [provider]  NOVOLOG MIX 70/30 FLEXPEN (70-30) 100 UNIT/ML FlexPen  04/23/19   [provider]  rosuvastatin (CRESTOR) 40 MG tablet Take 40 mg by mouth daily.    [provider]    Physical Exam:  Constitutional: Frail elderly male who appears acutely ill Vitals:   06/04/2019 1258 06/09/2019 1259 06/11/2019 1315  BP:  (!) 156/79 (!) 145/75  Pulse:  90 85  Resp:  20 (!) 21  Temp:  98.4 F (36.9 C)   TempSrc:  Oral   SpO2:  100% 100%  Weight: 77.1 kg    Height: 6\' 1"  (1.854 m)     Eyes: PERRL, lids and conjunctivae normal ENMT: Mucous membranes are dry. Posterior pharynx clear of any exudate or lesions.   Neck: normal, supple, no masses, no thyromegaly Respiratory: Mildly tachypneic with no significant wheezes or rhonchi, but poor overall aeration.  Patient currently on 5 L of nasal cannula oxygen with O2 saturations maintained. Cardiovascular: Regular rate and rhythm, no murmurs / rubs / gallops. No extremity edema. 2+ pedal pulses. No carotid bruits.  Abdomen: no tenderness, no masses palpated. No hepatosplenomegaly. Bowel sounds positive.  Musculoskeletal: no clubbing / cyanosis. No joint deformity upper and lower extremities. Good ROM, no contractures. Normal muscle tone.  Skin: no rashes, lesions, ulcers.  Poor skin turgor. Neurologic: CN 2-12 grossly intact. Sensation intact, DTR normal. Strength 5/5 in all 4.  Psychiatric: Normal judgment and insight. Alert and oriented x 3. Normal mood.     Labs on Admission: I have personally reviewed following labs and imaging studies  CBC: Recent Labs  Lab 06/15/2019 1324  WBC 8.9  NEUTROABS 7.7  HGB 12.6*  HCT 42.2  MCV 86.1  PLT 0000000   Basic Metabolic Panel: Recent Labs  Lab 06/08/2019 1324  NA 142  K 3.4*  CL 104  CO2 23  GLUCOSE 554*  BUN 51*  CREATININE 4.67*   CALCIUM 8.0*   GFR: Estimated Creatinine Clearance: 14.2 mL/min (A) (by C-G formula based on SCr of 4.67 mg/dL (H)). Liver Function Tests: Recent Labs  Lab 05/22/2019 1324  AST 35  ALT 24  ALKPHOS 72  BILITOT 1.2  PROT 7.1  ALBUMIN 2.0*   No results for input(s): LIPASE, AMYLASE in the last 168 hours. No results for input(s): AMMONIA in the last 168 hours. Coagulation Profile: No results for input(s): INR, PROTIME in the last 168 hours. Cardiac Enzymes: No results for input(s): CKTOTAL, CKMB, CKMBINDEX, TROPONINI in the last 168 hours. BNP (last 3 results) No results for input(s): PROBNP in the last 8760 hours. HbA1C: No results for input(s): HGBA1C in the last 72 hours. CBG: No results for input(s): GLUCAP in the last 168 hours. Lipid Profile: Recent Labs    06/07/2019 1324  TRIG 56   Thyroid Function Tests: No results for input(s): TSH, T4TOTAL, FREET4,  T3FREE, THYROIDAB in the last 72 hours. Anemia Panel: Recent Labs    05/28/2019 1324  FERRITIN 830*   Urine analysis: No results found for: COLORURINE, APPEARANCEUR, LABSPEC, PHURINE, GLUCOSEU, HGBUR, BILIRUBINUR, KETONESUR, PROTEINUR, UROBILINOGEN, NITRITE, LEUKOCYTESUR Sepsis Labs: No results found for this or any previous visit (from the past 240 hour(s)).   Radiological Exams on Admission: DG Chest Port 1 View  Result Date: 05/21/2019 CLINICAL DATA:  Cough.  Shortness of breath.  Fever. EXAM: PORTABLE CHEST 1 VIEW COMPARISON:  Chest x-ray 02/03/2012, 06/16/2005. FINDINGS: Mediastinum hilar structures normal. Heart size normal. Diffuse bilateral interstitial prominence. Pneumonitis could present this fashion. Bibasilar atelectasis with right base alveolar infiltrate. No prominent pleural effusion or pneumothorax. Stable elevation left hemidiaphragm. IMPRESSION: 1. Diffuse bilateral interstitial prominence. Pneumonitis could present this fashion. Bibasilar atelectasis with right base alveolar infiltrate. 2.  Chronic  elevation left hemidiaphragm. Electronically Signed   By: Marcello Moores  Register   On: 06/07/2019 13:32    EKG: Independently reviewed.  87 bpm with QTc 505  Assessment/Plan Respiratory failure with hypoxia secondary to pneumonia due to COVID-19: Acute.  Patient presents with complaints of worsening shortness of breath.  On EMS arrival O2 saturations noted to be 81% on room air for which she was placed on nonrebreather.  COVID-19 screening was positive and his wife was previously diagnosed 1 week ago.  Chest x-ray showing diffuse bilateral-prominence concerning for pneumonitis with right basilar infiltrate.  Patient currently maintaining O2 saturations on 5 L nasal cannula oxygen. -Admit to progressive bed at [place] order set utilized -Continuous pulse oximetry with nasal cannula oxygen to maintain O2 saturation -Follow-up blood, urine, sputum cultures -Empiric antibiotics of Rocephin and doxycycline due to elevated procalcitonin -Dexamethasone 6 mg IV daily -Albuterol inhaler -Vitamin C and zinc -Antitussives as needed -Pharmacy consulted for remdesivir  Diabetes mellitus type 2 with hyperglycemia: On admission blood glucose elevated at 554 without elevated anion gap.  Patient has been holding insulin at home due to poor p.o. intake. -Hypoglycemic protocols -Continue Novolin 70/30 mix at 25 units twice daily -Additional sensitive sliding scale insulin added on -Adjust insulin regimen as needed  Acute renal failure superimposed on chronic kidney disease: Patient presents with creatinine elevated up to 4.67 with BUN 51.  Last available creatinine on file was 1.75 from 01/2012.  Suspect prerenal cause of symptoms, but patient also on diuretics. -Bolus additional 1 L normal saline IV fluids, then placed on a rate of 100 mL/h -Strict intake and output -Recheck creatinine in a.m. -Avoid nephrotoxic agents  Prolonged QT: Acute.  QTC 505. -Correct electrolyte  abnormalities -Limiting QT prolonging medications -Recheck EKG in a.m.  Hypokalemia: Acute.  Potassium mildly low at 3.4.   -Give 30 mEq of potassium chloride IV -Continue to monitor and replace as needed  Essential hypertension: Blood pressures currently stable -Continue Coreg and amlodipine -Hold furosemide due to acute renal failure  Hyperlipidemia -Continue Crestor  GI prophylaxis: Pepcid   DVT prophylaxis: heparin  Code Status: Full Family Communication: Son updated over the phone  Disposition Plan: To be determined Consults called: None Admission status: Inpatient  Norval Morton MD [facility] Pager [phone]   If 7PM-7AM, please contact night-coverage [email or web address] Password [facility]  06/13/2019, 2:47 PM

## 2019-06-12 NOTE — ED Provider Notes (Signed)
[facility] EMERGENCY DEPARTMENT Provider Note   CSN: [id number] Arrival date & time: 05/22/2019  1255     History Chief Complaint  Patient presents with  . Shortness of Breath  . COVID    Steve Martinez is a 78 y.o. male brought in by EMS for evaluation of difficulty breathing, cough, increased lethargy, fevers.  Per EMS, patient's wife was diagnosed with Covid.  He reports that over the last week, he has noticed increased lethargy and fatigue.  Son today noticed that patient had had some vomiting and was having difficulty breathing, prompting EMS call.  On EMS arrival, he is febrile at 103.6 and O2 sats were 81% on room air.  He has no oxygen requirement.  He states he has been coughing over the last several days.  He states cough is productive of phlegm.  He also reports that he takes NovoLog for diabetes and states that he has not wanted to eat or drink over the last several days so he has not been taking his insulin.  EMS did report his blood sugars were in the 500s.  Patient states he has not had any chest pain, abdominal pain.  He states he just feels weak and tired.  The history is provided by the patient and the EMS personnel.       Past Medical History:  Diagnosis Date  . Back pain    occasionally d/t pulled muscle  . Cataracts, bilateral   . Diabetes mellitus    lantus bid  . GERD (gastroesophageal reflux disease)   . Hemorrhoids   . History of bronchitis    last year  . Hyperlipidemia    takes Zocor daily  . Hypertension    takes Diltiazem,Maxzide,and Quinapril daily  . Myocardial infarction ([place]) 17-90yrs ago  . Stroke ([place])    x 3;left side weaker    There are no problems to display for this patient.   Past Surgical History:  Procedure Laterality Date  . CARDIAC CATHETERIZATION  17-81yrs ago  . CATARACT EXTRACTION W/PHACO  02/08/2012   Procedure: CATARACT EXTRACTION PHACO AND INTRAOCULAR LENS PLACEMENT (IOC);  Surgeon: Adonis Brook, MD;   Location: [facility];  Service: Ophthalmology;  Laterality: Right;  . COLONOSCOPY    . REFRACTIVE SURGERY    . SHOULDER SURGERY Left        Family History  Problem Relation Age of Onset  . Colon cancer Neg Hx   . Esophageal cancer Neg Hx   . Rectal cancer Neg Hx   . Stomach cancer Neg Hx     Social History   Tobacco Use  . Smoking status: Never Smoker  . Smokeless tobacco: Never Used  Substance Use Topics  . Alcohol use: No  . Drug use: No    Home Medications Prior to Admission medications   Medication Sig Start Date End Date Taking? Authorizing Provider  acetaminophen (TYLENOL) 500 MG tablet Take 1,000 mg by mouth every 6 (six) hours as needed for mild pain or fever.   Yes [provider]  amLODipine (NORVASC) 10 MG tablet Take 10 mg by mouth daily.   Yes [provider]  aspirin EC 325 MG tablet Take 325 mg by mouth daily.   Yes [provider]  carvedilol (COREG) 6.25 MG tablet Take 6.25 mg by mouth 2 (two) times daily with a meal.   Yes [provider]  furosemide (LASIX) 40 MG tablet Take 40 mg by mouth daily.   Yes [provider]  NOVOLOG MIX 70/30 FLEXPEN (70-30) 100 UNIT/ML FlexPen Inject 20-30 Units into the skin 2 (two) times daily with a meal.  04/23/19  Yes [provider]  rosuvastatin (CRESTOR) 40 MG tablet Take 40 mg by mouth daily.   Yes [provider]  ACCU-CHEK AVIVA PLUS test strip  03/18/19   [provider]  BD PEN NEEDLE NANO 2ND GEN 32G X 4 MM MISC  04/09/19   [provider]    Allergies    Patient has no known allergies.  Review of Systems   Review of Systems  Constitutional: Positive for appetite change, fatigue and fever.  Respiratory: Positive for cough and shortness of breath.   Cardiovascular: Negative for chest pain.  Gastrointestinal: Negative for abdominal pain, nausea and vomiting.  Genitourinary: Negative for dysuria and hematuria.  Neurological: Negative  for headaches.  All other systems reviewed and are negative.   Physical Exam Updated Vital Signs BP (!) 145/75   Pulse 85   Temp 98.4 F (36.9 C) (Oral)   Resp (!) 21   Ht 6\' 1"  (1.854 m)   Wt 77.1 kg   SpO2 100%   BMI 22.43 kg/m   Physical Exam Vitals and nursing note reviewed.  Constitutional:      Appearance: Normal appearance. He is well-developed.     Comments: Frail and elderly appearing   HENT:     Head: Normocephalic and atraumatic.  Eyes:     General: Lids are normal.     Conjunctiva/sclera: Conjunctivae normal.     Pupils: Pupils are equal, round, and reactive to light.  Cardiovascular:     Rate and Rhythm: Normal rate and regular rhythm.     Pulses: Normal pulses.     Heart sounds: Normal heart sounds. No murmur. No friction rub. No gallop.   Pulmonary:     Effort: Pulmonary effort is normal. Tachypnea present.     Breath sounds: Rales present.     Comments: Tachypnea noted with some mild increased work of breathing.  Rales noted bilateral lung bases. No evidence of respiratory distress.  Abdominal:     Palpations: Abdomen is soft. Abdomen is not rigid.     Tenderness: There is no abdominal tenderness. There is no guarding.     Comments: Abdomen is soft, non-distended, non-tender. No rigidity, No guarding. No peritoneal signs.  Musculoskeletal:        General: Normal range of motion.     Cervical back: Full passive range of motion without pain.  Skin:    General: Skin is warm and dry.     Capillary Refill: Capillary refill takes less than 2 seconds.  Neurological:     Mental Status: He is alert and oriented to person, place, and time.  Psychiatric:        Speech: Speech normal.     ED Results / Procedures / Treatments   Labs (all labs ordered are listed, but only abnormal results are displayed) Labs Reviewed  LACTIC ACID, PLASMA - Abnormal; Notable for the following components:      Result Value   Lactic Acid, Venous 2.3 (*)    All other  components within normal limits  CBC WITH DIFFERENTIAL/PLATELET - Abnormal; Notable for the following components:   Hemoglobin 12.6 (*)    MCH 25.7 (*)    MCHC 29.9 (*)    Lymphs Abs 0.6 (*)    Abs Immature Granulocytes 0.10 (*)    All other components within normal limits  COMPREHENSIVE METABOLIC PANEL - Abnormal; Notable for the following components:   Potassium 3.4 (*)    Glucose, Bld 554 (*)    BUN 51 (*)    Creatinine, Ser 4.67 (*)    Calcium 8.0 (*)    Albumin 2.0 (*)    GFR calc non Af Amer 11 (*)    GFR calc Af Amer 13 (*)    All other components within normal limits  D-DIMER, QUANTITATIVE (NOT AT [facility]) - Abnormal; Notable for the following components:   D-Dimer, Quant >20.00 (*)    All other components within normal limits  LACTATE DEHYDROGENASE - Abnormal; Notable for the following components:   LDH 396 (*)    All other components within normal limits  FERRITIN - Abnormal; Notable for the following components:   Ferritin 830 (*)    All other components within normal limits  C-REACTIVE PROTEIN - Abnormal; Notable for the following components:   CRP 17.2 (*)    All other components within normal limits  POC SARS CORONAVIRUS 2 AG -  ED - Abnormal; Notable for the following components:   SARS Coronavirus 2 Ag POSITIVE (*)    All other components within normal limits  CULTURE, BLOOD (ROUTINE X 2)  CULTURE, BLOOD (ROUTINE X 2)  PROCALCITONIN  TRIGLYCERIDES  FIBRINOGEN  LACTIC ACID, PLASMA    EKG EKG Interpretation  Date/Time:  Wednesday June 12 2019 13:01:05 EST Ventricular Rate:  87 PR Interval:    QRS Duration: 84 QT Interval:  419 QTC Calculation: 505 R Axis:   56 Text Interpretation: Sinus rhythm Probable left atrial enlargement Borderline ST depression, inferior leads Borderline ST elevation, anterolateral leads Prolonged QT interval No significant change since last tracing Confirmed by Wandra Arthurs [phone]) on 05/28/2019 1:02:11 PM   Radiology DG  Chest Port 1 View  Result Date: 06/04/2019 CLINICAL DATA:  Cough.  Shortness of breath.  Fever. EXAM: PORTABLE CHEST 1 VIEW COMPARISON:  Chest x-ray 02/03/2012, 06/16/2005. FINDINGS: Mediastinum hilar structures normal. Heart size normal. Diffuse bilateral interstitial prominence. Pneumonitis could present this fashion. Bibasilar atelectasis with right base alveolar infiltrate. No prominent pleural effusion or pneumothorax. Stable elevation left hemidiaphragm. IMPRESSION: 1. Diffuse bilateral interstitial prominence. Pneumonitis could present this fashion. Bibasilar atelectasis with right base alveolar infiltrate. 2.  Chronic elevation left hemidiaphragm. Electronically Signed   By: Marcello Moores  Register   On: 05/25/2019 13:32    Procedures .Critical Care Performed by: Volanda Napoleon, PA-C Authorized by: Volanda Napoleon, PA-C   Critical care provider statement:    Critical care time (minutes):  35   Critical care was necessary to treat or prevent imminent or life-threatening deterioration of the following conditions: hypoxia.   Critical care was time spent personally by me on the following activities:  Discussions with consultants, evaluation of patient's response to treatment, examination of patient, ordering and performing treatments and interventions, ordering and review of laboratory studies, ordering and review of radiographic studies, pulse oximetry, re-evaluation of patient's condition, obtaining history from patient or surrogate and review of old charts   (including critical care time)  Medications Ordered in ED Medications  sodium chloride 0.9 % bolus 1,000 mL (has no administration in time range)  insulin aspart (novoLOG) injection 4 Units (has no administration in time range)    ED Course  I have reviewed the triage vital signs and the nursing notes.  Pertinent labs & imaging results that were available during my care of the patient were reviewed by me and  considered in my  medical decision making (see chart for details).    MDM Rules/Calculators/A&P                      78 year old male who presents via EMS for evaluation of cough, fatigue, decreased appetite, difficulty breathing.  Wife tested positive for Covid.  On EMS arrival, he is febrile at 103.6 and hypoxic into the 80s.  He was placed on 13 L nonrebreather which improved his oxygenation status.  He currently is 100% on 13 L.  Concern for infectious process such as COVID-19.  Plan check labs, chest x-ray.  Covid is positive.  Lactic acid is elevated at 2.3.  CMP shows potassium 3.4, glucose of 554, BUN of 51, creatinine of 4.67. Last 1 I have is from about 7 years ago which showed his creatinine to be at 1.5. Patient given fluids and insulin. CBC shows no leukocytosis.  Hemoglobin stable at 12.6.  Chest x-ray with diffuse bilateral prominence.  Patient transitioned from NRB to 5L O2 and is maintaining sats between 98-99. Will require admission for COVID, AKI, and hypoxia.   Discussed patient with Dr. Tamala Julian (hospitalist) who accepts patient for admission.   Steve Martinez was evaluated in Emergency Department on 06/03/2019 for the symptoms described in the history of present illness. He was evaluated in the context of the global COVID-19 pandemic, which necessitated consideration that the patient might be at risk for infection with the SARS-CoV-2 virus that causes COVID-19. Institutional protocols and algorithms that pertain to the evaluation of patients at risk for COVID-19 are in a state of rapid change based on information released by regulatory bodies including the CDC and federal and state organizations. These policies and algorithms were followed during the patient's care in the ED.  Portions of this note were generated with [employer]. Dictation errors may occur despite best attempts at proofreading.   Final Clinical Impression(s) / ED Diagnoses Final diagnoses:  COVID-19  Acute kidney  injury [facility])  Hypoxia    Rx / DC Orders ED Discharge Orders    None       Volanda Napoleon, PA-C 06/11/2019 1526    Drenda Freeze, MD 06/13/19 [phone]

## 2019-06-13 ENCOUNTER — Other Ambulatory Visit: Payer: Self-pay

## 2019-06-13 ENCOUNTER — Inpatient Hospital Stay (HOSPITAL_COMMUNITY): Payer: Medicare HMO

## 2019-06-13 DIAGNOSIS — N179 Acute kidney failure, unspecified: Secondary | ICD-10-CM

## 2019-06-13 DIAGNOSIS — I10 Essential (primary) hypertension: Secondary | ICD-10-CM

## 2019-06-13 DIAGNOSIS — R7989 Other specified abnormal findings of blood chemistry: Secondary | ICD-10-CM

## 2019-06-13 DIAGNOSIS — E785 Hyperlipidemia, unspecified: Secondary | ICD-10-CM

## 2019-06-13 LAB — CBC WITH DIFFERENTIAL/PLATELET
Abs Immature Granulocytes: 0.11 10*3/uL — ABNORMAL HIGH (ref 0.00–0.07)
Basophils Absolute: 0 10*3/uL (ref 0.0–0.1)
Basophils Relative: 0 %
Eosinophils Absolute: 0 10*3/uL (ref 0.0–0.5)
Eosinophils Relative: 0 %
HCT: 39.7 % (ref 39.0–52.0)
Hemoglobin: 11.8 g/dL — ABNORMAL LOW (ref 13.0–17.0)
Immature Granulocytes: 1 %
Lymphocytes Relative: 7 %
Lymphs Abs: 0.6 10*3/uL — ABNORMAL LOW (ref 0.7–4.0)
MCH: 25.5 pg — ABNORMAL LOW (ref 26.0–34.0)
MCHC: 29.7 g/dL — ABNORMAL LOW (ref 30.0–36.0)
MCV: 85.9 fL (ref 80.0–100.0)
Monocytes Absolute: 0.3 10*3/uL (ref 0.1–1.0)
Monocytes Relative: 3 %
Neutro Abs: 7.6 10*3/uL (ref 1.7–7.7)
Neutrophils Relative %: 89 %
Platelets: 145 10*3/uL — ABNORMAL LOW (ref 150–400)
RBC: 4.62 MIL/uL (ref 4.22–5.81)
RDW: 15.4 % (ref 11.5–15.5)
WBC: 8.6 10*3/uL (ref 4.0–10.5)
nRBC: 0 % (ref 0.0–0.2)

## 2019-06-13 LAB — COMPREHENSIVE METABOLIC PANEL
ALT: 22 U/L (ref 0–44)
AST: 33 U/L (ref 15–41)
Albumin: 1.9 g/dL — ABNORMAL LOW (ref 3.5–5.0)
Alkaline Phosphatase: 70 U/L (ref 38–126)
Anion gap: 11 (ref 5–15)
BUN: 52 mg/dL — ABNORMAL HIGH (ref 8–23)
CO2: 23 mmol/L (ref 22–32)
Calcium: 8 mg/dL — ABNORMAL LOW (ref 8.9–10.3)
Chloride: 112 mmol/L — ABNORMAL HIGH (ref 98–111)
Creatinine, Ser: 3.68 mg/dL — ABNORMAL HIGH (ref 0.61–1.24)
GFR calc Af Amer: 17 mL/min — ABNORMAL LOW (ref >60)
GFR calc non Af Amer: 15 mL/min — ABNORMAL LOW (ref >60)
Glucose, Bld: 203 mg/dL — ABNORMAL HIGH (ref 70–99)
Potassium: 4 mmol/L (ref 3.5–5.1)
Sodium: 146 mmol/L — ABNORMAL HIGH (ref 135–145)
Total Bilirubin: 0.4 mg/dL (ref 0.3–1.2)
Total Protein: 6.8 g/dL (ref 6.5–8.1)

## 2019-06-13 LAB — HEMOGLOBIN A1C
Hgb A1c MFr Bld: 9.6 % — ABNORMAL HIGH (ref 4.8–5.6)
Mean Plasma Glucose: 228.82 mg/dL

## 2019-06-13 LAB — PROTIME-INR
INR: 1.8 — ABNORMAL HIGH (ref 0.8–1.2)
Prothrombin Time: 20.5 seconds — ABNORMAL HIGH (ref 11.4–15.2)

## 2019-06-13 LAB — PHOSPHORUS: Phosphorus: 2.8 mg/dL (ref 2.5–4.6)

## 2019-06-13 LAB — GLUCOSE, CAPILLARY
Glucose-Capillary: 296 mg/dL — ABNORMAL HIGH (ref 70–99)
Glucose-Capillary: 229 mg/dL — ABNORMAL HIGH (ref 70–99)
Glucose-Capillary: 170 mg/dL — ABNORMAL HIGH (ref 70–99)
Glucose-Capillary: 179 mg/dL — ABNORMAL HIGH (ref 70–99)
Glucose-Capillary: 376 mg/dL — ABNORMAL HIGH (ref 70–99)

## 2019-06-13 LAB — D-DIMER, QUANTITATIVE: D-Dimer, Quant: >20 ug/mL-FEU — ABNORMAL HIGH (ref 0.00–0.50)

## 2019-06-13 LAB — C-REACTIVE PROTEIN: CRP: 15.5 mg/dL — ABNORMAL HIGH (ref <1.0)

## 2019-06-13 LAB — MAGNESIUM: Magnesium: 1.9 mg/dL (ref 1.7–2.4)

## 2019-06-13 LAB — FERRITIN: Ferritin: 639 ng/mL — ABNORMAL HIGH (ref 24–336)

## 2019-06-13 LAB — APTT: aPTT: 108 seconds — ABNORMAL HIGH (ref 24–36)

## 2019-06-13 LAB — PROCALCITONIN: Procalcitonin: 1.16 ng/mL

## 2019-06-13 LAB — STREP PNEUMONIAE URINARY ANTIGEN: Strep Pneumo Urinary Antigen: NEGATIVE

## 2019-06-13 LAB — HEPARIN LEVEL (UNFRACTIONATED): Heparin Unfractionated: 0.78 IU/mL — ABNORMAL HIGH (ref 0.30–0.70)

## 2019-06-13 MED ORDER — MAGNESIUM SULFATE IN D5W 1-5 GM/100ML-% IV SOLN
1.0000 g | Freq: Once | INTRAVENOUS | Status: AC
  Administered 2019-06-13: 12:00:00 1 g via INTRAVENOUS
  Filled 2019-06-13: qty 100

## 2019-06-13 MED ORDER — HEPARIN (PORCINE) 25000 UT/250ML-% IV SOLN
700.0000 [IU]/h | INTRAVENOUS | Status: DC
  Administered 2019-06-13: 13:00:00 1250 [IU]/h via INTRAVENOUS
  Administered 2019-06-14: 1100 [IU]/h via INTRAVENOUS
  Administered 2019-06-15: 13:00:00 700 [IU]/h via INTRAVENOUS
  Filled 2019-06-13 (×3): qty 250

## 2019-06-13 MED ORDER — DEXTROSE 5 % IV SOLN: INTRAVENOUS | Status: DC

## 2019-06-13 NOTE — Progress Notes (Signed)
Pt is A/Ox3. Pt has slow or delayed responses to questions but is appropriate. Pt is now on 3L o2 via [place] with sats >90%. NAD. VSS. SR on tele. Pt has refused to get to up to chair, pt advised by MD of importance. Remdesivir 2nd dose infusing at this time. Pt sitting up in bed eating. Will continue to monitor.

## 2019-06-13 NOTE — Plan of Care (Signed)
  Problem: Education: Goal: Knowledge of risk factors and measures for prevention of condition will improve Outcome: Progressing   Problem: Coping: Goal: Psychosocial and spiritual needs will be supported Outcome: Progressing   Problem: Respiratory: Goal: Will maintain a patent airway Outcome: Progressing Goal: Complications related to the disease process, condition or treatment will be avoided or minimized Outcome: Progressing   

## 2019-06-13 NOTE — Progress Notes (Signed)
PROGRESS NOTE                                                                                                                                                                                                             Patient Demographics:    Steve Martinez, is a 78 y.o. male, DOB - Jun 26, 1940, YOK:[id number]  Outpatient Primary MD for the patient is Gaynelle Arabian, MD    LOS - 1  Admit date - 06/06/2019    Chief Complaint  Patient presents with  . Shortness of Breath  . COVID       Brief Narrative  Steve Martinez is a 78 y.o. male with medical history significant of hypertension, hyperlipidemia, diabetes mellitus type 2, GERD, CVA with left-sided weakness.  He presents with complaints of progressively worsening shortness of breath over the last week, was diagnosed with acute hypoxic respiratory failure due to COVID-19 pneumonia and admitted to [facility]   Subjective:    Steve Martinez today has, No headache, No chest pain, No abdominal pain - No Nausea, No new weakness tingling or numbness, no cough or shortness of breath at rest.  Wearing oxygen.   Assessment  & Plan :     1. Acute Hypoxic Resp. Failure due to Acute Covid 19 Viral Pneumonitis during the ongoing 2020 Covid 19 Pandemic - he has moderate to severe disease, currently on 3 L nasal cannula oxygen, has been started on IV steroids and remdesivir and seems to be doing okay, continue monitoring closely.  Encouraged the patient to sit up in chair in the daytime use I-S and flutter valve for pulmonary toiletry and then prone in bed when at night.  Actemra off label use - patient and his son on 06/13/2019 were told that if COVID-19 pneumonitis gets worse we might potentially use Actemra off label, patient denies any known history of tuberculosis or hepatitis, understands the risks and benefits and they would want to proceed with Actemra treatment if required.   SpO2: 91 % O2  Flow Rate (L/min): 3 L/min  Recent Labs  Lab 06/09/2019 1324 06/13/19 0509  CRP 17.2* 15.5*  DDIMER >20.00* >20.00*  FERRITIN 830* 639*  PROCALCITON 1.35 1.16    Hepatic Function Latest Ref Rng & Units 06/13/2019 06/16/2019  Total Protein 6.5 - 8.1 g/dL 6.8 7.1  Albumin 3.5 - 5.0 g/dL  1.9(L) 2.0(L)  AST 15 - 41 U/L 33 35  ALT 0 - 44 U/L 22 24  Alk Phosphatase 38 - 126 U/L 70 72  Total Bilirubin 0.3 - 1.2 mg/dL 0.4 1.2     2.  Previous history of stroke.  Minimally verbal at baseline.  Supportive care.  Continue statin for secondary prevention.  3.  AKI with hypernatremia on CKD stage III.  Last creatinine on file is 1.75 in 2013.  He certainly could have progressed in terms of his renal failure since then.  For now gently hydrate with D5W and monitor.  4.  Dyslipidemia.  On Crestor.  5.  Hypertension.  Continue combination of Coreg and Norvasc.  Lasix on hold.  6.  Hypokalemia.  Replaced.  7.  Extremely elevated D-dimer.  Highly suspicious for a blood clot, will check lower extremity venous duplex, if needed may pursue CT if renal function allows.  For now full dose anticoagulation and monitor.  8.  Mildly elevated QTC.  Hypokalemia corrected, give 1 g of mag, avoid QTC prolonging agents.  Recheck EKG in the morning.    9. DM type II.  On sliding scale.  No results found for: HGBA1C  CBG (last 3)  Recent Labs    06/13/19 0055 06/13/19 0531 06/13/19 0732  GLUCAP 296* 229* 170*     Condition - Fair  Family Communication  :  Son 06/13/19  Code Status :  Full  Diet :   Diet Order            Diet Carb Modified Fluid consistency: Thin; Room service appropriate? Yes  Diet effective now               Disposition Plan  :  TBD  Consults  :  None  Procedures  :    PUD Prophylaxis :   DVT Prophylaxis  :  Heparin    Lab Results  Component Value Date   PLT 145 (L) 06/13/2019    Inpatient Medications  Scheduled Meds: . albuterol  2 puff Inhalation  Q6H  . amLODipine  10 mg Oral Daily  . vitamin C  500 mg Oral Daily  . carvedilol  6.25 mg Oral BID WC  . dexamethasone (DECADRON) injection  6 mg Intravenous Q24H  . heparin  7,500 Units Subcutaneous Q8H  . influenza vaccine adjuvanted  0.5 mL Intramuscular Tomorrow-1000  . insulin aspart  0-9 Units Subcutaneous Q4H  . insulin aspart protamine- aspart  25 Units Subcutaneous BID WC  . pneumococcal 23 valent vaccine  0.5 mL Intramuscular [place]-1000  . rosuvastatin  40 mg Oral Daily  . sodium chloride flush  3 mL Intravenous Q12H  . zinc sulfate  220 mg Oral Daily   Continuous Infusions: . dextrose 75 mL/hr at 06/13/19 1093  . famotidine (PEPCID) IV 20 mg (06/13/19 0810)  . remdesivir 100 mg in NS 100 mL Stopped (06/13/19 1121)   PRN Meds:.acetaminophen, chlorpheniramine-HYDROcodone, guaiFENesin-dextromethorphan  Antibiotics  :    Anti-infectives (From admission, onward)   Start     Dose/Rate Route Frequency Ordered Stop   06/13/19 1000  remdesivir 100 mg in sodium chloride 0.9 % 100 mL IVPB     100 mg 200 mL/hr over 30 Minutes Intravenous Daily 06/05/2019 1612 06/17/19 0959   06/15/2019 1615  cefTRIAXone (ROCEPHIN) 2 g in sodium chloride 0.9 % 100 mL IVPB  Status:  Discontinued     2 g 200 mL/hr over 30 Minutes Intravenous Every 24 hours 06/06/2019  1611 06/13/19 0751   06/06/2019 1615  doxycycline (VIBRAMYCIN) 100 mg in sodium chloride 0.9 % 250 mL IVPB  Status:  Discontinued     100 mg 125 mL/hr over 120 Minutes Intravenous Every 12 hours 05/25/2019 1611 06/13/19 0751   05/27/2019 1615  remdesivir 200 mg in sodium chloride 0.9% 250 mL IVPB     200 mg 580 mL/hr over 30 Minutes Intravenous Once 05/28/2019 1612 06/02/2019 1755       Time Spent in minutes  30   Lala Lund M.D on 06/13/2019 at 11:43 AM  To page go to [email or web address] - password [facility]  [facility] -  Office  [phone]   See all Orders from today for further details    Objective:   Vitals:   06/13/19  0825 06/13/19 0826 06/13/19 0827 06/13/19 0900  BP:      Pulse: 76 75 76 76  Resp: (!) 21 (!) 21 (!) 24 (!) 22  Temp:      TempSrc:      SpO2: 93% 95% 93% 91%  Weight:      Height:        Wt Readings from Last 3 Encounters:  06/17/2019 77.1 kg  05/21/19 78 kg  05/07/19 78 kg     Intake/Output Summary (Last 24 hours) at 06/13/2019 1143 Last data filed at 06/13/2019 1131 Gross per 24 hour  Intake 6220.02 ml  Output 550 ml  Net 5670.02 ml     Physical Exam  Awake Alert, minimally verbal,  No new F.N deficits, flat affect [place].AT,PERRAL Supple Neck,No JVD, No cervical lymphadenopathy appriciated.  Symmetrical Chest wall movement, Good air movement bilaterally, CTAB RRR,No Gallops,Rubs or new Murmurs, No Parasternal Heave +ve B.Sounds, Abd Soft, No tenderness, No organomegaly appriciated, No rebound - guarding or rigidity. No Cyanosis, Clubbing or edema, No new Rash or bruise      Data Review:    CBC Recent Labs  Lab 05/25/2019 1324 06/13/19 0509  WBC 8.9 8.6  HGB 12.6* 11.8*  HCT 42.2 39.7  PLT 179 145*  MCV 86.1 85.9  MCH 25.7* 25.5*  MCHC 29.9* 29.7*  RDW 15.3 15.4  LYMPHSABS 0.6* 0.6*  MONOABS 0.5 0.3  EOSABS 0.0 0.0  BASOSABS 0.0 0.0    Chemistries  Recent Labs  Lab 05/23/2019 1324 06/15/2019 1904 06/13/19 0509  NA 142 144 146*  K 3.4* 3.5 4.0  CL 104 108 112*  CO2 '23 22 23  ' GLUCOSE 554* 440* 203*  BUN 51* 48* 52*  CREATININE 4.67* 4.24* 3.68*  CALCIUM 8.0* 7.8* 8.0*  MG  --   --  1.9  AST 35  --  33  ALT 24  --  22  ALKPHOS 72  --  70  BILITOT 1.2  --  0.4   ------------------------------------------------------------------------------------------------------------------ Recent Labs    05/31/2019 1324  TRIG 56    No results found for: HGBA1C ------------------------------------------------------------------------------------------------------------------ No results for input(s): TSH, T4TOTAL, T3FREE, THYROIDAB in the last 72  hours.  Invalid input(s): FREET3  Cardiac Enzymes No results for input(s): CKMB, TROPONINI, MYOGLOBIN in the last 168 hours.  Invalid input(s): CK ------------------------------------------------------------------------------------------------------------------ No results found for: BNP  Micro Results Recent Results (from the past 240 hour(s))  Blood Culture (routine x 2)     Status: None (Preliminary result)   Collection Time: 06/11/2019  1:19 PM   Specimen: BLOOD  Result Value Ref Range Status   Specimen Description BLOOD SITE NOT SPECIFIED  Final   Special Requests  Final    BOTTLES DRAWN AEROBIC AND ANAEROBIC Blood Culture adequate volume   Culture   Final    NO GROWTH < 24 HOURS Performed at [facility], [place] [address], [place], [place] [address]    Report Status PENDING  Incomplete  Blood Culture (routine x 2)     Status: None (Preliminary result)   Collection Time: 05/26/2019  1:41 PM   Specimen: BLOOD  Result Value Ref Range Status   Specimen Description BLOOD LEFT ANTECUBITAL  Final   Special Requests   Final    BOTTLES DRAWN AEROBIC AND ANAEROBIC Blood Culture adequate volume   Culture   Final    NO GROWTH < 24 HOURS Performed at [facility], [place] [address], [place], [place] [address]    Report Status PENDING  Incomplete    Radiology Reports DG Chest Port 1 View  Result Date: 06/15/2019 CLINICAL DATA:  Cough.  Shortness of breath.  Fever. EXAM: PORTABLE CHEST 1 VIEW COMPARISON:  Chest x-ray 02/03/2012, 06/16/2005. FINDINGS: Mediastinum hilar structures normal. Heart size normal. Diffuse bilateral interstitial prominence. Pneumonitis could present this fashion. Bibasilar atelectasis with right base alveolar infiltrate. No prominent pleural effusion or pneumothorax. Stable elevation left hemidiaphragm. IMPRESSION: 1. Diffuse bilateral interstitial prominence. Pneumonitis could present this fashion. Bibasilar atelectasis with right base alveolar  infiltrate. 2.  Chronic elevation left hemidiaphragm. Electronically Signed   By: Marcello Moores  Register   On: 05/24/2019 13:32

## 2019-06-13 NOTE — Progress Notes (Signed)
Bilateral lower extremity venous duplex has been completed. Preliminary results can be found in CV Proc through chart review.  Results were given to the patient's nurse, Zuel.  06/13/19 3:54 PM Steve Martinez RVT

## 2019-06-13 NOTE — Progress Notes (Signed)
Pt given Pneumonia vaccine in Left deltoid and Flu vaccine in Right Deltoid. Pt tolerated well and no complaints at this time.

## 2019-06-13 NOTE — Progress Notes (Signed)
Pt is back in bed. Hep gtt still infusingl. No signs of bleeding. VSS. MD advised of RLE dvt per vasc tech.pt on 3L O2 via [place]. Tele SR.

## 2019-06-13 NOTE — Progress Notes (Signed)
ANTICOAGULATION CONSULT NOTE - Initial Consult  Pharmacy Consult for Heparin Indication: presumed DVT  No Known Allergies  Patient Measurements: Height: 6\' 1"  (185.4 cm) Weight: 170 lb (77.1 kg) IBW/kg (Calculated) : 79.9 Heparin Dosing Weight: 77 kg  Vital Signs: Temp: 97.7 F (36.5 C) (12/24 0734) Temp Source: Axillary (12/24 0734) BP: 135/67 (12/24 1209) Pulse Rate: 81 (12/24 1209)  Labs: Recent Labs    05/22/2019 1324 06/10/2019 1904 06/13/19 0509  HGB 12.6*  --  11.8*  HCT 42.2  --  39.7  PLT 179  --  145*  CREATININE 4.67* 4.24* 3.68*    Estimated Creatinine Clearance: 18 mL/min (A) (by C-G formula based on SCr of 3.68 mg/dL (H)).   Medical History: Past Medical History:  Diagnosis Date  . Back pain    occasionally d/t pulled muscle  . Cataracts, bilateral   . Diabetes mellitus    lantus bid  . GERD (gastroesophageal reflux disease)   . Hemorrhoids   . History of bronchitis    last year  . Hyperlipidemia    takes Zocor daily  . Hypertension    takes Diltiazem,Maxzide,and Quinapril daily  . Myocardial infarction ([place]) 17-63yrs ago  . Stroke ([place])    x 3;left side weaker    Assessment: 78 y/o M admited with respiratory failure due to COVID-PNA on heparin 7500 unit [place] q 8 h for VTE ppx. D-dimer is extremely elevated, LE [place] ordered. Pharmacy consulted to transition patient to heparin infusion. Patient received two doses (15000 units) of subc heparin this AM.   D-dimer > 20   Goal of Therapy:  Heparin level 0.3-0.7 units/ml Monitor platelets by anticoagulation protocol: Yes   Plan:  No bolus Start heparin infusion at 1250 units/hr Check anti-Xa level in 8 hours and daily while on heparin Continue to monitor H&H and platelets Monitor for s/s of bleeding  Ulice Dash D 06/13/2019,12:21 PM

## 2019-06-13 NOTE — Progress Notes (Signed)
ANTICOAGULATION CONSULT NOTE   Pharmacy Consult for Heparin Indication:  DVT  No Known Allergies  Patient Measurements: Height: 6\' 1"  (185.4 cm) Weight: 170 lb (77.1 kg) IBW/kg (Calculated) : 79.9 Heparin Dosing Weight: 77 kg  Vital Signs: Temp: 98.4 F (36.9 C) (12/24 1536) Temp Source: Oral (12/24 1536) BP: 142/65 (12/24 1554) Pulse Rate: 84 (12/24 1700)  Labs: Recent Labs    06/04/2019 1324 06/13/2019 1904 06/13/19 0509 06/13/19 1248 06/13/19 2100  HGB 12.6*  --  11.8*  --   --   HCT 42.2  --  39.7  --   --   PLT 179  --  145*  --   --   APTT  --   --   --  108*  --   LABPROT  --   --   --  20.5*  --   INR  --   --   --  1.8*  --   HEPARINUNFRC  --   --   --   --  0.78*  CREATININE 4.67* 4.24* 3.68*  --   --     Estimated Creatinine Clearance: 18 mL/min (A) (by C-G formula based on SCr of 3.68 mg/dL (H)).   Assessment: 78 y/o M admited with respiratory failure due to COVID-PNA on heparin 7500 unit [place] q 8 h for VTE ppx. D-dimer is extremely elevated, LE US shows acute DVT involving the R peroneal veins and acute superficial vein thrombosis in the L small saphenous vein.  Heparin level 0.78 (supratherapeutic) on gtt at 1250 units/hr. No bleeding noted.  Goal of Therapy:  Heparin level 0.3-0.7 units/ml Monitor platelets by anticoagulation protocol: Yes   Plan:  Decrease heparin infusion to 1100 units/hr F/u 8 hr heparin level  Sherlon Handing, PharmD, BCPS Please see amion for complete clinical pharmacist phone list 06/13/2019,10:45 PM

## 2019-06-13 NOTE — Progress Notes (Deleted)
Pt more relaxed. Placed 2L O2 on pt for comfort. sats 100%. HR 100 and rr 38.

## 2019-06-13 NOTE — Progress Notes (Signed)
Pt needs encouragement for deep breathing w/IS. Pt achieving 1000 but needs to slowly breathe deep.

## 2019-06-13 NOTE — Evaluation (Signed)
Physical Therapy Evaluation Patient Details Name: Steve Martinez MRN: [id number] DOB: 1940/09/18 Today's Date: 06/13/2019   History of Present Illness  78 y.o male w/ hx of CVA, MI, HTN, HLD, bronchitis, GERD, DM, B cataracts, back pain. Pt also w/ hx of shoulder surgery, refractive surgery abd cardiac cath. pt presented to hospital w/ c/o progressiverly worsening SOB over a week and was dx with acute hypoxic respiratory failure ude to COVID 19 PNA.  Clinical Impression   Pt admitted with above diagnosis. PTA lived home with spouse and states was very independent completing all ADLs even yard work independently. Pt currently with functional limitations due to the deficits listed below (see PT Problem List). This pm pt seems to be processing information very slowly, needing increased time and redirection to tasks. Pt was needing mod a with transfers and  Min a with ambulation, pt able to ambulate approx 57ft with RW in room. Pt at rest was on 4L/min via [place] and sats in high 90s, with ambulation noted desat to high 70s then to low 80s. Pt did not appear to be in distress no exhibit frank increased work of breathing. Once seated again needed some cues for pursed lip breathing and sats increased back to high 90s. Pt will benefit from skilled PT to increase their independence and safety with mobility to allow discharge to the venue listed below.       Follow Up Recommendations Home health PT    Equipment Recommendations  Rolling walker with 5" wheels    Recommendations for Other Services OT consult     Precautions / Restrictions Precautions Precautions: Fall Restrictions Weight Bearing Restrictions: No      Mobility  Bed Mobility               General bed mobility comments: pt sitting in recliner at therapist arrival  Transfers Overall transfer level: Needs assistance Equipment used: Rolling walker (2 wheeled) Transfers: Sit to/from Stand Sit to Stand: Mod assist             Ambulation/Gait Ambulation/Gait assistance: Min assist Gait Distance (Feet): 50 Feet Assistive device: Rolling walker (2 wheeled) Gait Pattern/deviations: Narrow base of support;Decreased step length - right;Decreased step length - left;Decreased stride length     General Gait Details: ambulated on 4L/min sats decreased to high 70s then to low 80s needing cues for pursed lip breathing to recover  Stairs            Wheelchair Mobility    Modified Rankin (Stroke Patients Only)       Balance Overall balance assessment: Needs assistance Sitting-balance support: Feet supported Sitting balance-Leahy Scale: Fair       Standing balance-Leahy Scale: Fair                               Pertinent Vitals/Pain Pain Assessment: No/denies pain    Home Living Family/patient expects to be discharged to:: Private residence Living Arrangements: Spouse/significant other;Children Available Help at Discharge: Family Type of Home: House Home Access: Stairs to enter Entrance Stairs-Rails: Can reach both Entrance Stairs-Number of Steps: 5 Home Layout: One level Home Equipment: Environmental consultant - 2 wheels;Cane - single point;Bedside commode;Grab bars - tub/shower;Tub bench;Wheelchair - manual      Prior Function Level of Independence: Independent               Hand Dominance   Dominant Hand: Right    Extremity/Trunk Assessment   Upper  Extremity Assessment Upper Extremity Assessment: Generalized weakness    Lower Extremity Assessment Lower Extremity Assessment: Generalized weakness       Communication   Communication: No difficulties  Cognition Arousal/Alertness: Awake/alert Behavior During Therapy: WFL for tasks assessed/performed Overall Cognitive Status: No family/caregiver present to determine baseline cognitive functioning                                 General Comments: seems to be slow processing especially when completing breathing  exercises needing continued cues and redicrection to complete exercises      General Comments      Exercises Other Exercises Other Exercises: initiated use of incentive spirometer, pt only able to pull approx 778ml x 5 reps with max cues Other Exercises: flutter valve x 5 also with max cues   Assessment/Plan    PT Assessment Patient needs continued PT services  PT Problem List Decreased strength;Decreased activity tolerance;Decreased balance;Decreased mobility;Decreased coordination;Decreased safety awareness       PT Treatment Interventions Gait training;Functional mobility training;DME instruction;Therapeutic activities;Therapeutic exercise;Balance training;Neuromuscular re-education;Patient/family education    PT Goals (Current goals can be found in the Care Plan section)  Acute Rehab PT Goals Patient Stated Goal: to get better and go home  PT Goal Formulation: With patient Time For Goal Achievement: 06/27/19 Potential to Achieve Goals: Good    Frequency Min 3X/week   Barriers to discharge        Co-evaluation               AM-PAC PT "6 Clicks" Mobility  Outcome Measure Help needed turning from your back to your side while in a flat bed without using bedrails?: A Little Help needed moving from lying on your back to sitting on the side of a flat bed without using bedrails?: A Little Help needed moving to and from a bed to a chair (including a wheelchair)?: A Little Help needed standing up from a chair using your arms (e.g., wheelchair or bedside chair)?: A Lot Help needed to walk in hospital room?: A Little Help needed climbing 3-5 steps with a railing? : A Lot 6 Click Score: 16    End of Session Equipment Utilized During Treatment: Oxygen Activity Tolerance: Patient limited by fatigue;Patient limited by lethargy;Treatment limited secondary to medical complications (Comment) Patient left: in chair;with call bell/phone within reach Nurse Communication: Mobility  status PT Visit Diagnosis: Other abnormalities of gait and mobility (R26.89);Muscle weakness (generalized) (M62.81)    Time: 1405-1430 PT Time Calculation (min) (ACUTE ONLY): 25 min   Charges:   PT Evaluation $PT Eval Moderate Complexity: 1 Mod PT Treatments $Therapeutic Activity: 8-22 mins        Horald Chestnut, PT   Delford Field 06/13/2019, 3:46 PM

## 2019-06-14 LAB — CBC WITH DIFFERENTIAL/PLATELET
Abs Immature Granulocytes: 0.3 10*3/uL — ABNORMAL HIGH (ref 0.00–0.07)
Basophils Absolute: 0 10*3/uL (ref 0.0–0.1)
Basophils Relative: 0 %
Eosinophils Absolute: 0 10*3/uL (ref 0.0–0.5)
Eosinophils Relative: 0 %
HCT: 35.8 % — ABNORMAL LOW (ref 39.0–52.0)
Hemoglobin: 11 g/dL — ABNORMAL LOW (ref 13.0–17.0)
Immature Granulocytes: 2 %
Lymphocytes Relative: 5 %
Lymphs Abs: 0.6 10*3/uL — ABNORMAL LOW (ref 0.7–4.0)
MCH: 25.8 pg — ABNORMAL LOW (ref 26.0–34.0)
MCHC: 30.7 g/dL (ref 30.0–36.0)
MCV: 84 fL (ref 80.0–100.0)
Monocytes Absolute: 0.3 10*3/uL (ref 0.1–1.0)
Monocytes Relative: 2 %
Neutro Abs: 11.3 10*3/uL — ABNORMAL HIGH (ref 1.7–7.7)
Neutrophils Relative %: 91 %
Platelets: 202 10*3/uL (ref 150–400)
RBC: 4.26 MIL/uL (ref 4.22–5.81)
RDW: 15.2 % (ref 11.5–15.5)
WBC: 12.5 10*3/uL — ABNORMAL HIGH (ref 4.0–10.5)
nRBC: 0 % (ref 0.0–0.2)

## 2019-06-14 LAB — URINALYSIS, ROUTINE W REFLEX MICROSCOPIC
Bilirubin Urine: NEGATIVE
Glucose, UA: NEGATIVE mg/dL
Ketones, ur: NEGATIVE mg/dL
Leukocytes,Ua: NEGATIVE
Nitrite: NEGATIVE
Protein, ur: 100 mg/dL — AB
Specific Gravity, Urine: 1.014 (ref 1.005–1.030)
pH: 5 (ref 5.0–8.0)

## 2019-06-14 LAB — URINE CULTURE

## 2019-06-14 LAB — COMPREHENSIVE METABOLIC PANEL
ALT: 32 U/L (ref 0–44)
AST: 59 U/L — ABNORMAL HIGH (ref 15–41)
Albumin: 2.1 g/dL — ABNORMAL LOW (ref 3.5–5.0)
Alkaline Phosphatase: 80 U/L (ref 38–126)
Anion gap: 11 (ref 5–15)
BUN: 59 mg/dL — ABNORMAL HIGH (ref 8–23)
CO2: 20 mmol/L — ABNORMAL LOW (ref 22–32)
Calcium: 7.7 mg/dL — ABNORMAL LOW (ref 8.9–10.3)
Chloride: 109 mmol/L (ref 98–111)
Creatinine, Ser: 3.74 mg/dL — ABNORMAL HIGH (ref 0.61–1.24)
GFR calc Af Amer: 17 mL/min — ABNORMAL LOW (ref >60)
GFR calc non Af Amer: 15 mL/min — ABNORMAL LOW (ref >60)
Glucose, Bld: 210 mg/dL — ABNORMAL HIGH (ref 70–99)
Potassium: 3.3 mmol/L — ABNORMAL LOW (ref 3.5–5.1)
Sodium: 140 mmol/L (ref 135–145)
Total Bilirubin: 0.6 mg/dL (ref 0.3–1.2)
Total Protein: 6.7 g/dL (ref 6.5–8.1)

## 2019-06-14 LAB — LEGIONELLA PNEUMOPHILA SEROGP 1 UR AG: L. pneumophila Serogp 1 Ur Ag: NEGATIVE

## 2019-06-14 LAB — MAGNESIUM: Magnesium: 2.2 mg/dL (ref 1.7–2.4)

## 2019-06-14 LAB — CREATININE, URINE, RANDOM: Creatinine, Urine: 88.21 mg/dL

## 2019-06-14 LAB — BRAIN NATRIURETIC PEPTIDE: B Natriuretic Peptide: 330.6 pg/mL — ABNORMAL HIGH (ref 0.0–100.0)

## 2019-06-14 LAB — HEPARIN LEVEL (UNFRACTIONATED)
Heparin Unfractionated: 1.36 IU/mL — ABNORMAL HIGH (ref 0.30–0.70)
Heparin Unfractionated: 0.92 IU/mL — ABNORMAL HIGH (ref 0.30–0.70)

## 2019-06-14 LAB — OSMOLALITY: Osmolality: 312 mOsm/kg — ABNORMAL HIGH (ref 275–295)

## 2019-06-14 LAB — GLUCOSE, CAPILLARY
Glucose-Capillary: 127 mg/dL — ABNORMAL HIGH (ref 70–99)
Glucose-Capillary: 129 mg/dL — ABNORMAL HIGH (ref 70–99)
Glucose-Capillary: 171 mg/dL — ABNORMAL HIGH (ref 70–99)
Glucose-Capillary: 182 mg/dL — ABNORMAL HIGH (ref 70–99)
Glucose-Capillary: 202 mg/dL — ABNORMAL HIGH (ref 70–99)
Glucose-Capillary: 221 mg/dL — ABNORMAL HIGH (ref 70–99)

## 2019-06-14 LAB — SODIUM, URINE, RANDOM: Sodium, Ur: 39 mmol/L

## 2019-06-14 LAB — D-DIMER, QUANTITATIVE: D-Dimer, Quant: >20 ug/mL-FEU — ABNORMAL HIGH (ref 0.00–0.50)

## 2019-06-14 LAB — PROCALCITONIN: Procalcitonin: 1.23 ng/mL

## 2019-06-14 LAB — C-REACTIVE PROTEIN: CRP: 10.2 mg/dL — ABNORMAL HIGH (ref <1.0)

## 2019-06-14 LAB — OSMOLALITY, URINE: Osmolality, Ur: 401 mOsm/kg (ref 300–900)

## 2019-06-14 MED ORDER — POTASSIUM CHLORIDE CRYS ER 20 MEQ PO TBCR
40.0000 meq | EXTENDED_RELEASE_TABLET | Freq: Once | ORAL | Status: AC
  Administered 2019-06-14: 08:00:00 40 meq via ORAL
  Filled 2019-06-14: qty 2

## 2019-06-14 MED ORDER — PANTOPRAZOLE SODIUM 40 MG PO TBEC
40.0000 mg | DELAYED_RELEASE_TABLET | Freq: Every day | ORAL | Status: DC
  Administered 2019-06-14 – 2019-06-18 (×5): 40 mg via ORAL
  Filled 2019-06-14 (×5): qty 1

## 2019-06-14 NOTE — Progress Notes (Signed)
Pt sitting up in recliner. NAD. Hep gtt still infusing and signs of active bleeding. VSS. Pt now on 1L O2 via [place] and sats 95-100%

## 2019-06-14 NOTE — Progress Notes (Signed)
Pt resting quietly. VSS. RA 100%. HR 90. Will continue to monitor.

## 2019-06-14 NOTE — Progress Notes (Signed)
Contact physician and request order to discontinue D5 @ 50 ml/hr. Patient had 200 ml out put in 6 hours. No sign of edema. Lungs fine crackles and diminished on auscultation.    Patient has a history of stage three kidney disease and type two diabetes per patient. Patient was admitted with elevated blood sugar. Patient on Q 4 aqu check and sliding scale.   Dr Vanita Ingles on floor and granted request to Discontinue continuous fluid.

## 2019-06-14 NOTE — Progress Notes (Signed)
Pt resting quietly. Hep gtt infusing at 1100u/hr. Pharm awaiting hep unfract levels to result for dosing.

## 2019-06-14 NOTE — Progress Notes (Signed)
PROGRESS NOTE                                                                                                                                                                                                             Patient Demographics:    Steve Martinez, is a 78 y.o. male, DOB - Sep 05, 1940, [id number]  Outpatient Primary MD for the patient is Gaynelle Arabian, MD    LOS - 2  Admit date - 05/27/2019    Chief Complaint  Patient presents with  . Shortness of Breath  . COVID       Brief Narrative  Steve Martinez is a 78 y.o. male with medical history significant of hypertension, hyperlipidemia, diabetes mellitus type 2, GERD, CVA with left-sided weakness.  He presents with complaints of progressively worsening shortness of breath over the last week, was diagnosed with acute hypoxic respiratory failure due to COVID-19 pneumonia and admitted to [facility].   Subjective:   Patient in bed, appears comfortable, denies any headache, no fever, no chest pain or pressure, no shortness of breath , no abdominal pain. No focal weakness.    Assessment  & Plan :     1. Acute Hypoxic Resp. Failure due to Acute Covid 19 Viral Pneumonitis during the ongoing 2020 Covid 19 Pandemic - he has moderate to severe disease, currently on 3 L nasal cannula oxygen, has been started on IV steroids and remdesivir and seems to be doing okay, continue monitoring closely.  Encouraged the patient to sit up in chair in the daytime use I-S and flutter valve for pulmonary toiletry and then prone in bed when at night.    SpO2: 96 % O2 Flow Rate (L/min): 3 L/min  Recent Labs  Lab 06/06/2019 1324 06/13/19 0509 06/14/19 0005  CRP 17.2* 15.5* 10.2*  DDIMER >20.00* >20.00* >20.00*  FERRITIN 830* 639*  --   BNP  --   --  330.6*  PROCALCITON 1.35 1.16 1.23    Hepatic Function Latest Ref Rng & Units 06/14/2019 06/13/2019 05/29/2019  Total Protein 6.5 - 8.1 g/dL  6.7 6.8 7.1  Albumin 3.5 - 5.0 g/dL 2.1(L) 1.9(L) 2.0(L)  AST 15 - 41 U/L 59(H) 33 35  ALT 0 - 44 U/L 32 22 24  Alk Phosphatase 38 - 126 U/L 80 70 72  Total Bilirubin 0.3 - 1.2 mg/dL  0.6 0.4 1.2     2.  Previous history of stroke.  Minimally verbal at baseline.  Supportive care.  Continue statin for secondary prevention.  3.  AKI with hypernatremia on CKD stage III.  Last creatinine on file is 1.75 in 2013.  He certainly could have progressed in terms of his renal failure since then.  Na stable, check Renal US + Ur lytes.  4.  Dyslipidemia.  On Crestor.  5.  Hypertension.  Continue combination of Coreg and Norvasc.  Lasix on hold.  6.  Hypokalemia.  Replaced.  7.  Acute Bilateral Leg DVT - Hep gtt .  8.  Mildly elevated QTC.  Hypokalemia corrected, give 1 g of mag, avoid QTC prolonging agents.  Recheck EKG in the morning.    9. DM type II.  On sliding scale.  Lab Results  Component Value Date   HGBA1C 9.6 (H) 06/13/2019    CBG (last 3)  Recent Labs    06/14/19 0036 06/14/19 0354 06/14/19 0836  GLUCAP 182* 171* 127*     Condition - Fair  Family Communication  :  Son 06/13/19  Code Status :  Full  Diet :   Diet Order            Diet Carb Modified Fluid consistency: Thin; Room service appropriate? Yes  Diet effective now               Disposition Plan  :  TBD  Consults  :  None  Procedures  :    PUD Prophylaxis :   DVT Prophylaxis  :  Heparin    Lab Results  Component Value Date   PLT 202 06/14/2019    Inpatient Medications  Scheduled Meds: . albuterol  2 puff Inhalation Q6H  . amLODipine  10 mg Oral Daily  . vitamin C  500 mg Oral Daily  . carvedilol  6.25 mg Oral BID WC  . dexamethasone (DECADRON) injection  6 mg Intravenous Q24H  . insulin aspart  0-9 Units Subcutaneous Q4H  . insulin aspart protamine- aspart  25 Units Subcutaneous BID WC  . pantoprazole  40 mg Oral Daily  . rosuvastatin  40 mg Oral Daily  . sodium chloride flush  3  mL Intravenous Q12H  . zinc sulfate  220 mg Oral Daily   Continuous Infusions: . famotidine (PEPCID) IV Stopped (06/13/19 6948)  . heparin 1,100 Units/hr (06/14/19 5462)  . remdesivir 100 mg in NS 100 mL Stopped (06/13/19 1121)   PRN Meds:.acetaminophen, chlorpheniramine-HYDROcodone, guaiFENesin-dextromethorphan  Antibiotics  :    Anti-infectives (From admission, onward)   Start     Dose/Rate Route Frequency Ordered Stop   06/13/19 1000  remdesivir 100 mg in sodium chloride 0.9 % 100 mL IVPB     100 mg 200 mL/hr over 30 Minutes Intravenous Daily 06/15/2019 1612 06/17/19 0959   06/10/2019 1615  cefTRIAXone (ROCEPHIN) 2 g in sodium chloride 0.9 % 100 mL IVPB  Status:  Discontinued     2 g 200 mL/hr over 30 Minutes Intravenous Every 24 hours 06/03/2019 1611 06/13/19 0751   06/01/2019 1615  doxycycline (VIBRAMYCIN) 100 mg in sodium chloride 0.9 % 250 mL IVPB  Status:  Discontinued     100 mg 125 mL/hr over 120 Minutes Intravenous Every 12 hours 05/30/2019 1611 06/13/19 0751   06/07/2019 1615  remdesivir 200 mg in sodium chloride 0.9% 250 mL IVPB     200 mg 580 mL/hr over 30 Minutes Intravenous Once 05/22/2019 1612  06/05/2019 1755       Time Spent in minutes  30   Lala Lund M.D on 06/14/2019 at 9:00 AM  To page go to [email or web address] - password [facility]  [facility] -  Office  [phone]   See all Orders from today for further details    Objective:   Vitals:   06/14/19 0735 06/14/19 0740 06/14/19 0745 06/14/19 0750  BP:    131/68  Pulse: 65 61 65 67  Resp: '19 17 17 ' (!) 21  Temp:    97.7 F (36.5 C)  TempSrc:    Oral  SpO2: 95% 96% 95% 96%  Weight:      Height:        Wt Readings from Last 3 Encounters:  05/26/2019 77.1 kg  05/21/19 78 kg  05/07/19 78 kg     Intake/Output Summary (Last 24 hours) at 06/14/2019 0900 Last data filed at 06/14/2019 0500 Gross per 24 hour  Intake 2084.31 ml  Output 400 ml  Net 1684.31 ml     Physical Exam  Awake Alert, more verbal,   No new F.N deficits, flat affect [place].AT,PERRAL Supple Neck,No JVD, No cervical lymphadenopathy appriciated.  Symmetrical Chest wall movement, Good air movement bilaterally, CTAB RRR,No Gallops, Rubs or new Murmurs, No Parasternal Heave +ve B.Sounds, Abd Soft, No tenderness, No organomegaly appriciated, No rebound - guarding or rigidity. No Cyanosis, Clubbing or edema, No new Rash or bruise    Data Review:    CBC Recent Labs  Lab 05/28/2019 1324 06/13/19 0509 06/14/19 0005  WBC 8.9 8.6 12.5*  HGB 12.6* 11.8* 11.0*  HCT 42.2 39.7 35.8*  PLT 179 145* 202  MCV 86.1 85.9 84.0  MCH 25.7* 25.5* 25.8*  MCHC 29.9* 29.7* 30.7  RDW 15.3 15.4 15.2  LYMPHSABS 0.6* 0.6* 0.6*  MONOABS 0.5 0.3 0.3  EOSABS 0.0 0.0 0.0  BASOSABS 0.0 0.0 0.0    Chemistries  Recent Labs  Lab 05/21/2019 1324 06/17/2019 1904 06/13/19 0509 06/14/19 0005  NA 142 144 146* 140  K 3.4* 3.5 4.0 3.3*  CL 104 108 112* 109  CO2 '23 22 23 ' 20*  GLUCOSE 554* 440* 203* 210*  BUN 51* 48* 52* 59*  CREATININE 4.67* 4.24* 3.68* 3.74*  CALCIUM 8.0* 7.8* 8.0* 7.7*  MG  --   --  1.9 2.2  AST 35  --  33 59*  ALT 24  --  22 32  ALKPHOS 72  --  70 80  BILITOT 1.2  --  0.4 0.6   ------------------------------------------------------------------------------------------------------------------ Recent Labs    05/22/2019 1324  TRIG 56    Lab Results  Component Value Date   HGBA1C 9.6 (H) 06/13/2019   ------------------------------------------------------------------------------------------------------------------ No results for input(s): TSH, T4TOTAL, T3FREE, THYROIDAB in the last 72 hours.  Invalid input(s): FREET3  Cardiac Enzymes No results for input(s): CKMB, TROPONINI, MYOGLOBIN in the last 168 hours.  Invalid input(s): CK ------------------------------------------------------------------------------------------------------------------    Component Value Date/Time   BNP 330.6 (H) 06/14/2019 0005    Micro  Results Recent Results (from the past 240 hour(s))  Blood Culture (routine x 2)     Status: None (Preliminary result)   Collection Time: 06/11/2019  1:19 PM   Specimen: BLOOD  Result Value Ref Range Status   Specimen Description BLOOD SITE NOT SPECIFIED  Final   Special Requests   Final    BOTTLES DRAWN AEROBIC AND ANAEROBIC Blood Culture adequate volume   Culture   Final    NO GROWTH 2 DAYS  Performed at [facility], [place] [address], [place], [place] [address]    Report Status PENDING  Incomplete  Blood Culture (routine x 2)     Status: None (Preliminary result)   Collection Time: 05/27/2019  1:41 PM   Specimen: BLOOD  Result Value Ref Range Status   Specimen Description BLOOD LEFT ANTECUBITAL  Final   Special Requests   Final    BOTTLES DRAWN AEROBIC AND ANAEROBIC Blood Culture adequate volume   Culture   Final    NO GROWTH 2 DAYS Performed at [facility], [place] [address], [place], [place] [address]    Report Status PENDING  Incomplete    Radiology Reports DG Chest Port 1 View  Result Date: 06/11/2019 CLINICAL DATA:  Cough.  Shortness of breath.  Fever. EXAM: PORTABLE CHEST 1 VIEW COMPARISON:  Chest x-ray 02/03/2012, 06/16/2005. FINDINGS: Mediastinum hilar structures normal. Heart size normal. Diffuse bilateral interstitial prominence. Pneumonitis could present this fashion. Bibasilar atelectasis with right base alveolar infiltrate. No prominent pleural effusion or pneumothorax. Stable elevation left hemidiaphragm. IMPRESSION: 1. Diffuse bilateral interstitial prominence. Pneumonitis could present this fashion. Bibasilar atelectasis with right base alveolar infiltrate. 2.  Chronic elevation left hemidiaphragm. Electronically Signed   By: Marcello Moores  Register   On: 06/04/2019 13:32   Leg [place]  Result Date: 06/13/2019  Lower Venous Study Indications: Elevated Ddimer.  Risk Factors: COVID 19 positive. Comparison Study: No prior studies. Performing Technologist: Oliver Hum RVT  Examination Guidelines: A complete evaluation includes B-mode imaging, spectral Doppler, color Doppler, and power Doppler as needed of all accessible portions of each vessel. Bilateral testing is considered an integral part of a complete examination. Limited examinations for reoccurring indications may be performed as noted.  +---------+---------------+---------+-----------+----------+--------------+ RIGHT    CompressibilityPhasicitySpontaneityPropertiesThrombus Aging +---------+---------------+---------+-----------+----------+--------------+ CFV      Full           Yes      Yes                                 +---------+---------------+---------+-----------+----------+--------------+ SFJ      Full                                                        +---------+---------------+---------+-----------+----------+--------------+ FV Prox  Full                                                        +---------+---------------+---------+-----------+----------+--------------+ FV Mid   Full                                                        +---------+---------------+---------+-----------+----------+--------------+ FV DistalFull                                                        +---------+---------------+---------+-----------+----------+--------------+  PFV      Full                                                        +---------+---------------+---------+-----------+----------+--------------+ POP      Full           Yes      Yes                                 +---------+---------------+---------+-----------+----------+--------------+ PTV      None                                         Acute          +---------+---------------+---------+-----------+----------+--------------+ PERO     Full                                                        +---------+---------------+---------+-----------+----------+--------------+    +---------+---------------+---------+-----------+----------+--------------+ LEFT     CompressibilityPhasicitySpontaneityPropertiesThrombus Aging +---------+---------------+---------+-----------+----------+--------------+ CFV      Full           Yes      Yes                                 +---------+---------------+---------+-----------+----------+--------------+ SFJ      Full                                                        +---------+---------------+---------+-----------+----------+--------------+ FV Prox  Full                                                        +---------+---------------+---------+-----------+----------+--------------+ FV Mid   Full                                                        +---------+---------------+---------+-----------+----------+--------------+ FV DistalFull                                                        +---------+---------------+---------+-----------+----------+--------------+ PFV      Full                                                        +---------+---------------+---------+-----------+----------+--------------+  POP      Full           Yes      Yes                                 +---------+---------------+---------+-----------+----------+--------------+ PTV      Full                                                        +---------+---------------+---------+-----------+----------+--------------+ PERO     Full                                                        +---------+---------------+---------+-----------+----------+--------------+ SSV      None                                         Acute          +---------+---------------+---------+-----------+----------+--------------+     Summary: Right: Findings consistent with acute deep vein thrombosis involving the right peroneal veins. No cystic structure found in the popliteal fossa. Left: Findings consistent with acute  superficial vein thrombosis involving the left small saphenous vein. No cystic structure found in the popliteal fossa.  *See table(s) above for measurements and observations. Electronically signed by Monica Martinez MD on 06/13/2019 at 5:23:13 PM.    Final

## 2019-06-14 NOTE — Progress Notes (Addendum)
ANTICOAGULATION CONSULT NOTE   Pharmacy Consult for Heparin Indication: BL acute  DVT  12/24   No Known Allergies  Patient Measurements: Height: 6\' 1"  (185.4 cm) Weight: 170 lb (77.1 kg) IBW/kg (Calculated) : 79.9 Heparin Dosing Weight: 77 kg  Vital Signs: Temp: 97.7 F (36.5 C) (12/25 0750) Temp Source: Oral (12/25 0750) BP: 131/68 (12/25 0750) Pulse Rate: 88 (12/25 1100)  Labs: Recent Labs    05/29/2019 1324 06/04/2019 1904 06/13/19 0509 06/13/19 1248 06/13/19 2100 06/14/19 0005  HGB 12.6*  --  11.8*  --   --  11.0*  HCT 42.2  --  39.7  --   --  35.8*  PLT 179  --  145*  --   --  202  APTT  --   --   --  108*  --   --   LABPROT  --   --   --  20.5*  --   --   INR  --   --   --  1.8*  --   --   HEPARINUNFRC  --   --   --   --  0.78*  --   CREATININE 4.67* 4.24* 3.68*  --   --  3.74*    Estimated Creatinine Clearance: 17.8 mL/min (A) (by C-G formula based on SCr of 3.74 mg/dL (H)).   Assessment: 78 y/o M admited with respiratory failure due to COVID-PNA on heparin 7500 unit [place] q 8 h for VTE ppx. D-dimer is extremely elevated, 12/24 LE US shows acute DVT involving the R peroneal veins and acute superficial vein thrombosis in the L small saphenous vein.  Heparin level took 4 hours to result on 12/25 with lab reporting to RN that sample had to be diluted before testing. Per RN, lab stated that HL of 1.36 was accurate but clinically it is suspicious given heparin rate was decreased from last level of 0.78. Asked RN to re-draw HL- still pending.   HL took a long time to get back again. They said that it did come back at 0.92. We will adjust dose again and recheck a level tonight.   Goal of Therapy:  Heparin level 0.3-0.7 units/ml Monitor platelets by anticoagulation protocol: Yes   Plan:   Decrease heparin infusion to 900 units/hr Re-check 8hr HL Daily HL, CBC, monitor bleeding   Onnie Boer, PharmD, BCIDP, AAHIVP, CPP Infectious Disease Pharmacist 06/14/2019 6:09  PM

## 2019-06-15 ENCOUNTER — Inpatient Hospital Stay (HOSPITAL_COMMUNITY): Payer: Medicare HMO

## 2019-06-15 LAB — CBC WITH DIFFERENTIAL/PLATELET
Abs Immature Granulocytes: 0.18 10*3/uL — ABNORMAL HIGH (ref 0.00–0.07)
Basophils Absolute: 0 10*3/uL (ref 0.0–0.1)
Basophils Relative: 0 %
Eosinophils Absolute: 0 10*3/uL (ref 0.0–0.5)
Eosinophils Relative: 0 %
HCT: 36.7 % — ABNORMAL LOW (ref 39.0–52.0)
Hemoglobin: 11.4 g/dL — ABNORMAL LOW (ref 13.0–17.0)
Immature Granulocytes: 1 %
Lymphocytes Relative: 5 %
Lymphs Abs: 0.7 10*3/uL (ref 0.7–4.0)
MCH: 25.8 pg — ABNORMAL LOW (ref 26.0–34.0)
MCHC: 31.1 g/dL (ref 30.0–36.0)
MCV: 83 fL (ref 80.0–100.0)
Monocytes Absolute: 0.6 10*3/uL (ref 0.1–1.0)
Monocytes Relative: 4 %
Neutro Abs: 12.5 10*3/uL — ABNORMAL HIGH (ref 1.7–7.7)
Neutrophils Relative %: 90 %
Platelets: 216 10*3/uL (ref 150–400)
RBC: 4.42 MIL/uL (ref 4.22–5.81)
RDW: 15.5 % (ref 11.5–15.5)
WBC: 14.1 10*3/uL — ABNORMAL HIGH (ref 4.0–10.5)
nRBC: 0 % (ref 0.0–0.2)

## 2019-06-15 LAB — MAGNESIUM: Magnesium: 1.9 mg/dL (ref 1.7–2.4)

## 2019-06-15 LAB — COMPREHENSIVE METABOLIC PANEL
ALT: 34 U/L (ref 0–44)
AST: 59 U/L — ABNORMAL HIGH (ref 15–41)
Albumin: 2 g/dL — ABNORMAL LOW (ref 3.5–5.0)
Alkaline Phosphatase: 89 U/L (ref 38–126)
Anion gap: 15 (ref 5–15)
BUN: 68 mg/dL — ABNORMAL HIGH (ref 8–23)
CO2: 19 mmol/L — ABNORMAL LOW (ref 22–32)
Calcium: 7.8 mg/dL — ABNORMAL LOW (ref 8.9–10.3)
Chloride: 108 mmol/L (ref 98–111)
Creatinine, Ser: 4.47 mg/dL — ABNORMAL HIGH (ref 0.61–1.24)
GFR calc Af Amer: 14 mL/min — ABNORMAL LOW (ref >60)
GFR calc non Af Amer: 12 mL/min — ABNORMAL LOW (ref >60)
Glucose, Bld: 120 mg/dL — ABNORMAL HIGH (ref 70–99)
Potassium: 3.9 mmol/L (ref 3.5–5.1)
Sodium: 142 mmol/L (ref 135–145)
Total Bilirubin: 0.5 mg/dL (ref 0.3–1.2)
Total Protein: 6.3 g/dL — ABNORMAL LOW (ref 6.5–8.1)

## 2019-06-15 LAB — GLUCOSE, CAPILLARY
Glucose-Capillary: 102 mg/dL — ABNORMAL HIGH (ref 70–99)
Glucose-Capillary: 325 mg/dL — ABNORMAL HIGH (ref 70–99)
Glucose-Capillary: 187 mg/dL — ABNORMAL HIGH (ref 70–99)
Glucose-Capillary: 62 mg/dL — ABNORMAL LOW (ref 70–99)

## 2019-06-15 LAB — PROCALCITONIN: Procalcitonin: 1.35 ng/mL

## 2019-06-15 LAB — BRAIN NATRIURETIC PEPTIDE: B Natriuretic Peptide: 505.8 pg/mL — ABNORMAL HIGH (ref 0.0–100.0)

## 2019-06-15 LAB — HEPARIN LEVEL (UNFRACTIONATED)
Heparin Unfractionated: 1.01 IU/mL — ABNORMAL HIGH (ref 0.30–0.70)
Heparin Unfractionated: 0.92 IU/mL — ABNORMAL HIGH (ref 0.30–0.70)
Heparin Unfractionated: 0.44 IU/mL (ref 0.30–0.70)

## 2019-06-15 LAB — D-DIMER, QUANTITATIVE: D-Dimer, Quant: >20 ug/mL-FEU — ABNORMAL HIGH (ref 0.00–0.50)

## 2019-06-15 LAB — C-REACTIVE PROTEIN: CRP: 7.6 mg/dL — ABNORMAL HIGH (ref <1.0)

## 2019-06-15 MED ORDER — DEXAMETHASONE SODIUM PHOSPHATE 4 MG/ML IJ SOLN
4.0000 mg | INTRAMUSCULAR | Status: DC
  Administered 2019-06-15: 4 mg via INTRAVENOUS
  Filled 2019-06-15: qty 1

## 2019-06-15 MED ORDER — WARFARIN SODIUM 2.5 MG PO TABS
2.5000 mg | ORAL_TABLET | Freq: Once | ORAL | Status: AC
  Administered 2019-06-15: 2.5 mg via ORAL
  Filled 2019-06-15: qty 1

## 2019-06-15 MED ORDER — HEPARIN (PORCINE) 25000 UT/250ML-% IV SOLN
650.0000 [IU]/h | INTRAVENOUS | Status: DC
  Administered 2019-06-15: 15:00:00 550 [IU]/h via INTRAVENOUS
  Filled 2019-06-15: qty 250

## 2019-06-15 MED ORDER — POTASSIUM CHLORIDE CRYS ER 20 MEQ PO TBCR
20.0000 meq | EXTENDED_RELEASE_TABLET | Freq: Once | ORAL | Status: AC
  Administered 2019-06-15: 20 meq via ORAL
  Filled 2019-06-15: qty 1

## 2019-06-15 MED ORDER — INSULIN ASPART 100 UNIT/ML ~~LOC~~ SOLN
4.0000 [IU] | Freq: Three times a day (TID) | SUBCUTANEOUS | Status: DC
  Administered 2019-06-15 – 2019-06-17 (×9): 4 [IU] via SUBCUTANEOUS

## 2019-06-15 MED ORDER — WARFARIN - PHARMACIST DOSING INPATIENT: Freq: Every day | Status: DC

## 2019-06-15 NOTE — Progress Notes (Addendum)
Patient is awake and alert watching TV this PM. He remains on room air at this time and denies any SOB. Ambulated from bed to chair with standby assist of staff for wires and tubing. Tolerating ambulating without any dyspnea or desaturation events. Upon entering patients room at 1336 it was noticed that patients imed was turned off. Heparin gtt was still connected to the patient but was not infusing. This shutoff had occurred for an unknown amount of minutes before his heparin gtt was restarted at its previous rate of 700units>. Adjusted per pharmacy order.  Son was given update this morningl. He had no further updates required.

## 2019-06-15 NOTE — Progress Notes (Signed)
ANTICOAGULATION CONSULT NOTE   Pharmacy Consult for Heparin Indication: BL acute  DVT  12/24   No Known Allergies  Patient Measurements: Height: 6\' 1"  (185.4 cm) Weight: 170 lb (77.1 kg) IBW/kg (Calculated) : 79.9 Heparin Dosing Weight: 77 kg  Vital Signs: Temp: 98 F (36.7 C) (12/26 1200) Temp Source: Axillary (12/26 1200) BP: 162/71 (12/26 1200) Pulse Rate: 97 (12/26 1200)  Labs: Recent Labs    06/13/19 0509 06/13/19 1248 06/14/19 0005 06/14/19 1257 06/15/19 0158 06/15/19 1100  HGB 11.8*  --  11.0*  --  11.4*  --   HCT 39.7  --  35.8*  --  36.7*  --   PLT 145*  --  202  --  216  --   APTT  --  108*  --   --   --   --   LABPROT  --  20.5*  --   --   --   --   INR  --  1.8*  --   --   --   --   HEPARINUNFRC  --   --   --  0.92* 1.01* 0.92*  CREATININE 3.68*  --  3.74*  --  4.47*  --     Estimated Creatinine Clearance: 14.9 mL/min (A) (by C-G formula based on SCr of 4.47 mg/dL (H)).   Assessment: 78 y/o M admited with respiratory failure due to COVID-PNA on heparin 7500 unit [place] q 8 h for VTE ppx. D-dimer is extremely elevated, 12/24 LE US shows acute DVT involving the R peroneal veins and acute superficial vein thrombosis in the L small saphenous vein.  Heparin level remains supratherapeutic at 0.92 for some reason. INR wasn't drawn this AM. We will adjust heparin again.   Goal of Therapy:  Heparin level 0.3-0.7 units/ml  INR 2-3 Monitor platelets by anticoagulation protocol: Yes   Plan:  Hold heparin x30 minutes then decrease to 550 units/hr Re-check 8hr HL F/u with INR for coumadin dosing  Onnie Boer, PharmD, BCIDP, AAHIVP, CPP Infectious Disease Pharmacist 06/15/2019 1:22 PM

## 2019-06-15 NOTE — Progress Notes (Signed)
ANTICOAGULATION CONSULT NOTE   Pharmacy Consult for heparin and warfarin Indication: BL acute  DVT  12/24   No Known Allergies  Patient Measurements: Height: 6\' 1"  (185.4 cm) Weight: 170 lb (77.1 kg) IBW/kg (Calculated) : 79.9 Heparin Dosing Weight: 77 kg  Vital Signs: Temp: 98 F (36.7 C) (12/26 1200) Temp Source: Axillary (12/26 1200) BP: 162/71 (12/26 1200) Pulse Rate: 97 (12/26 1200)  Labs: Recent Labs    06/13/19 0509 06/13/19 1248 06/14/19 0005 06/14/19 1257 06/15/19 0158 06/15/19 1100  HGB 11.8*  --  11.0*  --  11.4*  --   HCT 39.7  --  35.8*  --  36.7*  --   PLT 145*  --  202  --  216  --   APTT  --  108*  --   --   --   --   LABPROT  --  20.5*  --   --   --   --   INR  --  1.8*  --   --   --   --   HEPARINUNFRC  --   --   --  0.92* 1.01* 0.92*  CREATININE 3.68*  --  3.74*  --  4.47*  --     Estimated Creatinine Clearance: 14.9 mL/min (A) (by C-G formula based on SCr of 4.47 mg/dL (H)).   Assessment: 78 y/o M admited with respiratory failure due to COVID-PNA on heparin 7500 unit [place] q 8 h for VTE ppx. D-dimer is extremely elevated, 12/24 LE US shows acute DVT involving the R peroneal veins and acute superficial vein thrombosis in the L small saphenous vein. His baseline INR is 1.80   Goal of Therapy:  Heparin level 0.3-0.7 units/ml INR  2.0 - 3.0 Monitor platelets by anticoagulation protocol: Yes   Plan:   Due to elevated baseline INR will proceed cautiously: warfarin 2.5 mg this evening x1  Stop heparin when 2 consecutive INR > 2.0  INR in am  Vallery Sa, PharmD, BCPS Please see amion for complete clinical pharmacist phone list 06/15/2019 12:27 PM

## 2019-06-15 NOTE — Evaluation (Signed)
Clinical/Bedside Swallow Evaluation Patient Details  Name: Steve Martinez MRN: [id number] Date of Birth: 1940/07/06  Today's Date: 06/15/2019 Time: SLP Start Time (ACUTE ONLY): 1455 SLP Stop Time (ACUTE ONLY): 1507 SLP Time Calculation (min) (ACUTE ONLY): 12 min  Past Medical History:  Past Medical History:  Diagnosis Date  . Back pain    occasionally d/t pulled muscle  . Cataracts, bilateral   . Diabetes mellitus    lantus bid  . GERD (gastroesophageal reflux disease)   . Hemorrhoids   . History of bronchitis    last year  . Hyperlipidemia    takes Zocor daily  . Hypertension    takes Diltiazem,Maxzide,and Quinapril daily  . Myocardial infarction ([place]) 17-34yrs ago  . Stroke ([place])    x 3;left side weaker   Past Surgical History:  Past Surgical History:  Procedure Laterality Date  . CARDIAC CATHETERIZATION  17-35yrs ago  . CATARACT EXTRACTION W/PHACO  02/08/2012   Procedure: CATARACT EXTRACTION PHACO AND INTRAOCULAR LENS PLACEMENT (IOC);  Surgeon: Adonis Brook, MD;  Location: [facility];  Service: Ophthalmology;  Laterality: Right;  . COLONOSCOPY    . REFRACTIVE SURGERY    . SHOULDER SURGERY Left    HPI:  Steve Martinez is a 78 y.o. male with medical history significant of hypertension, hyperlipidemia, diabetes mellitus type 2, GERD, CVA with left-sided weakness.  He presents with complaints of progressively worsening shortness of breath over the last week, was diagnosed with acute hypoxic respiratory failure due to COVID-19 pneumonia and admitted to [facility].   Assessment / Plan / Recommendation Clinical Impression  Pt is alert, stable O2 saturations, no supplemental O2, RR stable. Able to self feed regular solids and thin liquids without incident. Pt denies difficulty. Continue current diet. SLP will sign off.  SLP Visit Diagnosis: Dysphagia, oropharyngeal phase (R13.12)    Aspiration Risk  Mild aspiration risk    Diet Recommendation Regular;Thin liquid   Liquid Administration  via: Cup;Straw Medication Administration: Whole meds with liquid Supervision: Patient able to self feed    Other  Recommendations     Follow up Recommendations        Frequency and Duration            Prognosis        Swallow Study   General HPI: Steve Martinez is a 78 y.o. male with medical history significant of hypertension, hyperlipidemia, diabetes mellitus type 2, GERD, CVA with left-sided weakness.  He presents with complaints of progressively worsening shortness of breath over the last week, was diagnosed with acute hypoxic respiratory failure due to COVID-19 pneumonia and admitted to [facility]. Type of Study: Bedside Swallow Evaluation Previous Swallow Assessment: none Diet Prior to this Study: Regular;Thin liquids Temperature Spikes Noted: No Respiratory Status: Room air History of Recent Intubation: No Behavior/Cognition: Alert;Cooperative;Pleasant mood Oral Cavity Assessment: Within Functional Limits Oral Care Completed by SLP: No Oral Cavity - Dentition: Adequate natural dentition Vision: Functional for self-feeding Self-Feeding Abilities: Able to feed self Patient Positioning: Upright in chair Baseline Vocal Quality: Normal Volitional Cough: Strong Volitional Swallow: Able to elicit    Oral/Motor/Sensory Function Overall Oral Motor/Sensory Function: Within functional limits   Ice Chips     Thin Liquid Thin Liquid: Within functional limits Presentation: Cup;Self Fed    Nectar Thick     Honey Thick Honey Thick Liquid: Not tested   Puree Puree: Not tested   Solid     Solid: Within functional limits Presentation: Steve Rock Efren Kross, MA CCC-SLP  Acute Rehabilitation Services Pager [phone] Office [phone]  Lynann Beaver 06/15/2019,3:08 PM

## 2019-06-15 NOTE — TOC Initial Note (Signed)
Transition of Care [facility]) - Initial/Assessment Note    Patient Details  Name: Steve Martinez MRN: [id number] Date of Birth: [birth year]  Transition of Care [facility]) CM/SW Contact:    Shade Flood, LCSW Phone Number: 06/15/2019, 11:53 AM  Clinical Narrative:                  Pt admitted from home. Currently on room air receiving Remdesivir and steroids. Pt lives with his wife. Anticipating dc home with [facility] and possibly RN at dc.  TOC will follow.  Expected Discharge Plan: [place] Barriers to Discharge: Continued Medical Work up   Patient Goals and CMS Choice        Expected Discharge Plan and Services Expected Discharge Plan: [place] In-house Referral: Clinical Social Work     Living arrangements for the past 2 months: [place]                                      Prior Living Arrangements/Services Living arrangements for the past 2 months: Single Family Home Lives with:: Spouse Patient language and need for interpreter reviewed:: Yes Do you feel safe going back to the place where you live?: Yes      Need for Family Participation in Patient Care: Yes (Comment) Care giver support system in place?: Yes (comment)   Criminal Activity/Legal Involvement Pertinent to Current Situation/Hospitalization: No - Comment as needed  Activities of Daily Living Home Assistive Devices/Equipment: None ADL Screening (condition at time of admission) Patient's cognitive ability adequate to safely complete daily activities?: Yes Is the patient deaf or have difficulty hearing?: No Does the patient have difficulty seeing, even when wearing glasses/contacts?: No Does the patient have difficulty concentrating, remembering, or making decisions?: No Patient able to express need for assistance with ADLs?: Yes Does the patient have difficulty dressing or bathing?: No Independently performs ADLs?: Yes (appropriate for developmental  age) Does the patient have difficulty walking or climbing stairs?: No Weakness of Legs: None Weakness of Arms/Hands: None  Permission Sought/Granted                  Emotional Assessment       Orientation: : Oriented to Self, Oriented to Place, Oriented to  Time, Oriented to Situation Alcohol / Substance Use: Not Applicable Psych Involvement: No (comment)  Admission diagnosis:  Hypoxia [R09.02] Acute kidney injury ([place]) [N17.9] Pneumonia due to COVID-19 virus [U07.1, J12.89] COVID-19 [U07.1] Patient Active Problem List   Diagnosis Date Noted  . Pneumonia due to COVID-19 virus 06/15/2019  . Type 2 diabetes mellitus with hyperglycemia ([place]) 06/02/2019  . Acute renal failure superimposed on chronic kidney disease ([place]) 05/23/2019  . Essential hypertension 06/11/2019  . Hypokalemia 05/31/2019  . Prolonged QT interval 05/31/2019  . Hyperlipidemia 06/02/2019  . Acute on chronic respiratory failure with hypoxia ([place]) 06/01/2019   PCP:  Gaynelle Arabian, MD Pharmacy:   [facility], [place] - [address] [place] [address] Phone: [phone] Fax: [phone]  [id number] - [place], [place] - [address] [place] [address] Phone: [phone] Fax: [phone]     Social Determinants of Health (SDOH) Interventions    Readmission Risk Interventions Readmission Risk Prevention Plan 06/15/2019  Transportation Screening Complete  Palliative Care Screening Not Applicable  Medication Review (RN Care Manager) Complete  Some recent data might be hidden

## 2019-06-15 NOTE — Progress Notes (Signed)
ANTICOAGULATION CONSULT NOTE   Pharmacy Consult for Heparin Indication: BL acute  DVT  12/24   No Known Allergies  Patient Measurements: Height: 6\' 1"  (185.4 cm) Weight: 170 lb (77.1 kg) IBW/kg (Calculated) : 79.9 Heparin Dosing Weight: 77 kg  Vital Signs: Temp: 99.2 F (37.3 C) (12/26 2000) Temp Source: Oral (12/26 1600) BP: 138/63 (12/26 2000) Pulse Rate: 87 (12/26 2000)  Labs: Recent Labs    06/13/19 0509 06/13/19 1248 06/14/19 0005 06/15/19 0158 06/15/19 1100 06/15/19 2153  HGB 11.8*  --  11.0* 11.4*  --   --   HCT 39.7  --  35.8* 36.7*  --   --   PLT 145*  --  202 216  --   --   APTT  --  108*  --   --   --   --   LABPROT  --  20.5*  --   --   --   --   INR  --  1.8*  --   --   --   --   HEPARINUNFRC  --   --   --  1.01* 0.92* 0.44  CREATININE 3.68*  --  3.74* 4.47*  --   --     Estimated Creatinine Clearance: 14.9 mL/min (A) (by C-G formula based on SCr of 4.47 mg/dL (H)).   Assessment: 78 y/o M admited with respiratory failure due to COVID-PNA on heparin 7500 unit [place] q 8 h for VTE ppx. D-dimer is extremely elevated, 12/24 LE US shows acute DVT involving the R peroneal veins and acute superficial vein thrombosis in the L small saphenous vein.  Heparin level now therapeutic (0.44) on gtt at 550 units/hr. Noted that coumadin restarted per pharmacy tonight - order for 2.5mg  tonight.  Goal of Therapy:  Heparin level 0.3-0.7 units/ml  INR 2-3 Monitor platelets by anticoagulation protocol: Yes   Plan:  Continue heparin 550 units/hr F/u daily heparin level and INR  Sherlon Handing, PharmD, BCPS Please see amion for complete clinical pharmacist phone list 06/15/2019 11:06 PM

## 2019-06-15 NOTE — Progress Notes (Signed)
PROGRESS NOTE                                                                                                                                                                                                             Patient Demographics:    Steve Martinez, is a 78 y.o. male, DOB - [birth year], [id number]  Outpatient Primary MD for the patient is Gaynelle Arabian, MD    LOS - 3  Admit date - 06/11/2019    Chief Complaint  Patient presents with  . Shortness of Breath  . COVID       Brief Narrative  Steve Martinez is a 78 y.o. male with medical history significant of hypertension, hyperlipidemia, diabetes mellitus type 2, GERD, CVA with left-sided weakness.  He presents with complaints of progressively worsening shortness of breath over the last week, was diagnosed with acute hypoxic respiratory failure due to COVID-19 pneumonia and admitted to [facility].   Subjective:   Patient in bed, appears comfortable, denies any headache, no fever, no chest pain or pressure, no shortness of breath , no abdominal pain. No focal weakness.   Assessment  & Plan :     1. Acute Hypoxic Resp. Failure due to Acute Covid 19 Viral Pneumonitis during the ongoing 2020 Covid 19 Pandemic - he has moderate to severe disease, currently on 3 L nasal cannula oxygen, has been started on IV steroids and remdesivir and seems to be doing okay, continue monitoring closely.  Encouraged the patient to sit up in chair in the daytime use I-S and flutter valve for pulmonary toiletry and then prone in bed when at night.    SpO2: 93 % O2 Flow Rate (L/min): 3 L/min  Recent Labs  Lab 06/01/2019 1324 06/13/19 0509 06/14/19 0005 06/15/19 0158  CRP 17.2* 15.5* 10.2* 7.6*  DDIMER >20.00* >20.00* >20.00* >20.00*  FERRITIN 830* 639*  --   --   BNP  --   --  330.6* 505.8*  PROCALCITON 1.35 1.16 1.23 1.35    Hepatic Function Latest Ref Rng & Units 06/15/2019 06/14/2019  06/13/2019  Total Protein 6.5 - 8.1 g/dL 6.3(L) 6.7 6.8  Albumin 3.5 - 5.0 g/dL 2.0(L) 2.1(L) 1.9(L)  AST 15 - 41 U/L 59(H) 59(H) 33  ALT 0 - 44 U/L 34 32 22  Alk Phosphatase 38 - 126 U/L 89 80  70  Total Bilirubin 0.3 - 1.2 mg/dL 0.5 0.6 0.4     2.  Previous history of stroke.  Minimally verbal at baseline.  Supportive care.  Continue statin for secondary prevention.  3.  AKI with hypernatremia on CKD stage III.  Last creatinine on file is 1.75 in 2013.  He certainly could have progressed in terms of his renal failure since then, nonacute renal ultrasound, he tells me today that he follows with Dr. Hollie Salk so certainly has underlying renal issues. Currently no hyperkalemia, no indication for dialysis will monitor.  4.  Dyslipidemia.  On Crestor.  5.  Hypertension.  Continue combination of Coreg and Norvasc.  Lasix on hold.  6.  Hypokalemia.  Replaced.  7.  Acute Bilateral Leg DVT - Hep gtt, pharmacy for Coumadin or NOAC if possible with his renal function.  8. Prolonged QTC, electrolytes stable, not on any prolonging medications. Monitor on beta-blocker.    9. DM type II.  On sliding scale.  Lab Results  Component Value Date   HGBA1C 9.6 (H) 06/13/2019    CBG (last 3)  Recent Labs    06/14/19 2037 06/15/19 0051 06/15/19 0731  GLUCAP 129* 102* 325*     Condition - Fair  Family Communication  :  Son 06/13/19  Code Status :  Full  Diet :   Diet Order            Diet Carb Modified Fluid consistency: Thin; Room service appropriate? Yes  Diet effective now               Disposition Plan  :  TBD  Consults  :  None  Procedures  :    PUD Prophylaxis :   DVT Prophylaxis  :  Heparin    Lab Results  Component Value Date   PLT 216 06/15/2019    Inpatient Medications  Scheduled Meds: . albuterol  2 puff Inhalation Q6H  . amLODipine  10 mg Oral Daily  . vitamin C  500 mg Oral Daily  . carvedilol  6.25 mg Oral BID WC  . dexamethasone (DECADRON) injection   4 mg Intravenous Q24H  . insulin aspart  4 Units Subcutaneous TID WC  . insulin aspart protamine- aspart  25 Units Subcutaneous BID WC  . pantoprazole  40 mg Oral Daily  . rosuvastatin  40 mg Oral Daily  . sodium chloride flush  3 mL Intravenous Q12H  . zinc sulfate  220 mg Oral Daily   Continuous Infusions: . famotidine (PEPCID) IV 20 mg (06/15/19 0906)  . heparin 700 Units/hr (06/15/19 0334)  . remdesivir 100 mg in NS 100 mL 100 mg (06/15/19 1106)   PRN Meds:.acetaminophen, chlorpheniramine-HYDROcodone, guaiFENesin-dextromethorphan  Antibiotics  :    Anti-infectives (From admission, onward)   Start     Dose/Rate Route Frequency Ordered Stop   06/13/19 1000  remdesivir 100 mg in sodium chloride 0.9 % 100 mL IVPB     100 mg 200 mL/hr over 30 Minutes Intravenous Daily 05/28/2019 1612 06/17/19 0959   06/10/2019 1615  cefTRIAXone (ROCEPHIN) 2 g in sodium chloride 0.9 % 100 mL IVPB  Status:  Discontinued     2 g 200 mL/hr over 30 Minutes Intravenous Every 24 hours 06/08/2019 1611 06/13/19 0751   06/09/2019 1615  doxycycline (VIBRAMYCIN) 100 mg in sodium chloride 0.9 % 250 mL IVPB  Status:  Discontinued     100 mg 125 mL/hr over 120 Minutes Intravenous Every 12 hours 06/02/2019 1611 06/13/19 0751  06/19/2019 1615  remdesivir 200 mg in sodium chloride 0.9% 250 mL IVPB     200 mg 580 mL/hr over 30 Minutes Intravenous Once 05/29/2019 1612 06/04/2019 1755       Time Spent in minutes  30   Lala Lund M.D on 06/15/2019 at 12:02 PM  To page go to [email or web address] - password [facility]  [facility] -  Office  [phone]   See all Orders from today for further details    Objective:   Vitals:   06/14/19 1700 06/14/19 2000 06/15/19 0000 06/15/19 0800  BP:  135/65 128/66 (!) 141/62  Pulse: 91 86 89   Resp: 18 19 (!) 26   Temp:  98.8 F (37.1 C)  97.8 F (36.6 C)  TempSrc:  Oral  Axillary  SpO2: 93% 97% 93% 93%  Weight:      Height:        Wt Readings from Last 3 Encounters:   05/21/2019 77.1 kg  05/21/19 78 kg  05/07/19 78 kg     Intake/Output Summary (Last 24 hours) at 06/15/2019 1202 Last data filed at 06/15/2019 0700 Gross per 24 hour  Intake 375.6 ml  Output 800 ml  Net -424.4 ml     Physical Exam  Awake Alert, more verbal,  No new F.N deficits, flat affect [place].AT,PERRAL Supple Neck,No JVD, No cervical lymphadenopathy appriciated.  Symmetrical Chest wall movement, Good air movement bilaterally, CTAB RRR,No Gallops, Rubs or new Murmurs, No Parasternal Heave +ve B.Sounds, Abd Soft, No tenderness, No organomegaly appriciated, No rebound - guarding or rigidity. No Cyanosis, Clubbing or edema, No new Rash or bruise    Data Review:    CBC Recent Labs  Lab 06/11/2019 1324 06/13/19 0509 06/14/19 0005 06/15/19 0158  WBC 8.9 8.6 12.5* 14.1*  HGB 12.6* 11.8* 11.0* 11.4*  HCT 42.2 39.7 35.8* 36.7*  PLT 179 145* 202 216  MCV 86.1 85.9 84.0 83.0  MCH 25.7* 25.5* 25.8* 25.8*  MCHC 29.9* 29.7* 30.7 31.1  RDW 15.3 15.4 15.2 15.5  LYMPHSABS 0.6* 0.6* 0.6* 0.7  MONOABS 0.5 0.3 0.3 0.6  EOSABS 0.0 0.0 0.0 0.0  BASOSABS 0.0 0.0 0.0 0.0    Chemistries  Recent Labs  Lab 06/13/2019 1324 06/11/2019 1904 06/13/19 0509 06/14/19 0005 06/15/19 0158  NA 142 144 146* 140 142  K 3.4* 3.5 4.0 3.3* 3.9  CL 104 108 112* 109 108  CO2 '23 22 23 ' 20* 19*  GLUCOSE 554* 440* 203* 210* 120*  BUN 51* 48* 52* 59* 68*  CREATININE 4.67* 4.24* 3.68* 3.74* 4.47*  CALCIUM 8.0* 7.8* 8.0* 7.7* 7.8*  MG  --   --  1.9 2.2 1.9  AST 35  --  33 59* 59*  ALT 24  --  22 32 34  ALKPHOS 72  --  70 80 89  BILITOT 1.2  --  0.4 0.6 0.5   ------------------------------------------------------------------------------------------------------------------ Recent Labs    05/25/2019 1324  TRIG 56    Lab Results  Component Value Date   HGBA1C 9.6 (H) 06/13/2019   ------------------------------------------------------------------------------------------------------------------ No  results for input(s): TSH, T4TOTAL, T3FREE, THYROIDAB in the last 72 hours.  Invalid input(s): FREET3  Cardiac Enzymes No results for input(s): CKMB, TROPONINI, MYOGLOBIN in the last 168 hours.  Invalid input(s): CK ------------------------------------------------------------------------------------------------------------------    Component Value Date/Time   BNP 505.8 (H) 06/15/2019 0158    Micro Results Recent Results (from the past 240 hour(s))  Blood Culture (routine x 2)     Status: None (Preliminary  result)   Collection Time: 05/25/2019  1:19 PM   Specimen: BLOOD  Result Value Ref Range Status   Specimen Description BLOOD SITE NOT SPECIFIED  Final   Special Requests   Final    BOTTLES DRAWN AEROBIC AND ANAEROBIC Blood Culture adequate volume   Culture   Final    NO GROWTH 3 DAYS Performed at [facility], [address] [place], [place] [address]    Report Status PENDING  Incomplete  Blood Culture (routine x 2)     Status: None (Preliminary result)   Collection Time: 05/24/2019  1:41 PM   Specimen: BLOOD  Result Value Ref Range Status   Specimen Description BLOOD LEFT ANTECUBITAL  Final   Special Requests   Final    BOTTLES DRAWN AEROBIC AND ANAEROBIC Blood Culture adequate volume   Culture   Final    NO GROWTH 3 DAYS Performed at [facility], [place] [address], [place], [place] [address]    Report Status PENDING  Incomplete  Urine culture     Status: Abnormal   Collection Time: 06/01/2019  7:04 PM   Specimen: Urine, Random  Result Value Ref Range Status   Specimen Description URINE, RANDOM  Final   Special Requests   Final    NONE Performed at [facility], [employer] [address] [place], [place] [address]    Culture MULTIPLE SPECIES PRESENT, SUGGEST RECOLLECTION (A)  Final   Report Status 06/14/2019 FINAL  Final    Radiology Reports US RENAL  Result Date: 06/15/2019 CLINICAL DATA:  78 year old male COVID-43. Acute renal injury on chronic renal  disease. EXAM: RENAL / URINARY TRACT ULTRASOUND COMPLETE COMPARISON:  Renal ultrasound 08/31/2018. FINDINGS: Right Kidney: Renal measurements: 10.0 x 4.9 x 6.1 centimeters = volume: 158 mL (previously 133). Chronically echogenic right kidney with no hydronephrosis or renal mass. Left Kidney: Renal measurements: 9.0 x 5.6 x 3.8 centimeters = volume: 98 mL (previously 153). Stable left renal cortex which also appears mildly echogenic. Cortical thickness seems better preserved in the left kidney. No left hydronephrosis or renal mass. Bladder: Appears normal for degree of bladder distention. Other: None. IMPRESSION: No acute renal findings and negative urinary bladder. Chronic medical renal disease suspected. Electronically Signed   By: Genevie Ann M.D.   On: 06/15/2019 08:50   DG Chest Port 1 View  Result Date: 06/05/2019 CLINICAL DATA:  Cough.  Shortness of breath.  Fever. EXAM: PORTABLE CHEST 1 VIEW COMPARISON:  Chest x-ray 02/03/2012, 06/16/2005. FINDINGS: Mediastinum hilar structures normal. Heart size normal. Diffuse bilateral interstitial prominence. Pneumonitis could present this fashion. Bibasilar atelectasis with right base alveolar infiltrate. No prominent pleural effusion or pneumothorax. Stable elevation left hemidiaphragm. IMPRESSION: 1. Diffuse bilateral interstitial prominence. Pneumonitis could present this fashion. Bibasilar atelectasis with right base alveolar infiltrate. 2.  Chronic elevation left hemidiaphragm. Electronically Signed   By: Marcello Moores  Register   On: 06/06/2019 13:32   Leg [place] Cone  Result Date: 06/13/2019  Lower Venous Study Indications: Elevated Ddimer.  Risk Factors: COVID 19 positive. Comparison Study: No prior studies. Performing Technologist: Oliver Hum RVT  Examination Guidelines: A complete evaluation includes B-mode imaging, spectral Doppler, color Doppler, and power Doppler as needed of all accessible portions of each vessel. Bilateral testing is considered an integral  part of a complete examination. Limited examinations for reoccurring indications may be performed as noted.  +---------+---------------+---------+-----------+----------+--------------+ RIGHT    CompressibilityPhasicitySpontaneityPropertiesThrombus Aging +---------+---------------+---------+-----------+----------+--------------+ CFV      Full  Yes      Yes                                 +---------+---------------+---------+-----------+----------+--------------+ SFJ      Full                                                        +---------+---------------+---------+-----------+----------+--------------+ FV Prox  Full                                                        +---------+---------------+---------+-----------+----------+--------------+ FV Mid   Full                                                        +---------+---------------+---------+-----------+----------+--------------+ FV DistalFull                                                        +---------+---------------+---------+-----------+----------+--------------+ PFV      Full                                                        +---------+---------------+---------+-----------+----------+--------------+ POP      Full           Yes      Yes                                 +---------+---------------+---------+-----------+----------+--------------+ PTV      None                                         Acute          +---------+---------------+---------+-----------+----------+--------------+ PERO     Full                                                        +---------+---------------+---------+-----------+----------+--------------+   +---------+---------------+---------+-----------+----------+--------------+ LEFT     CompressibilityPhasicitySpontaneityPropertiesThrombus Aging +---------+---------------+---------+-----------+----------+--------------+ CFV       Full           Yes      Yes                                 +---------+---------------+---------+-----------+----------+--------------+ SFJ      Full                                                        +---------+---------------+---------+-----------+----------+--------------+  FV Prox  Full                                                        +---------+---------------+---------+-----------+----------+--------------+ FV Mid   Full                                                        +---------+---------------+---------+-----------+----------+--------------+ FV DistalFull                                                        +---------+---------------+---------+-----------+----------+--------------+ PFV      Full                                                        +---------+---------------+---------+-----------+----------+--------------+ POP      Full           Yes      Yes                                 +---------+---------------+---------+-----------+----------+--------------+ PTV      Full                                                        +---------+---------------+---------+-----------+----------+--------------+ PERO     Full                                                        +---------+---------------+---------+-----------+----------+--------------+ SSV      None                                         Acute          +---------+---------------+---------+-----------+----------+--------------+     Summary: Right: Findings consistent with acute deep vein thrombosis involving the right peroneal veins. No cystic structure found in the popliteal fossa. Left: Findings consistent with acute superficial vein thrombosis involving the left small saphenous vein. No cystic structure found in the popliteal fossa.  *See table(s) above for measurements and observations. Electronically signed by Monica Martinez MD on 06/13/2019 at  5:23:13 PM.    Final

## 2019-06-15 NOTE — Progress Notes (Signed)
ANTICOAGULATION CONSULT NOTE   Pharmacy Consult for Heparin Indication: BL acute  DVT  12/24   No Known Allergies  Patient Measurements: Height: 6\' 1"  (185.4 cm) Weight: 170 lb (77.1 kg) IBW/kg (Calculated) : 79.9 Heparin Dosing Weight: 77 kg  Vital Signs: Temp: 98.8 F (37.1 C) (12/25 2000) Temp Source: Oral (12/25 2000) BP: 135/65 (12/25 2000) Pulse Rate: 86 (12/25 2000)  Labs: Recent Labs    06/13/19 0509 06/13/19 1248 06/14/19 0005 06/14/19 0830 06/14/19 1257 06/15/19 0158  HGB 11.8*  --  11.0*  --   --  11.4*  HCT 39.7  --  35.8*  --   --  36.7*  PLT 145*  --  202  --   --  216  APTT  --  108*  --   --   --   --   LABPROT  --  20.5*  --   --   --   --   INR  --  1.8*  --   --   --   --   HEPARINUNFRC  --   --   --  1.36* 0.92* 1.01*  CREATININE 3.68*  --  3.74*  --   --  4.47*    Estimated Creatinine Clearance: 14.9 mL/min (A) (by C-G formula based on SCr of 4.47 mg/dL (H)).   Assessment: 78 y/o M admited with respiratory failure due to COVID-PNA on heparin 7500 unit  q 8 h for VTE ppx. D-dimer is extremely elevated, 12/24 LE US shows acute DVT involving the R peroneal veins and acute superficial vein thrombosis in the L small saphenous vein.  Heparin level remains supratherapeutic (1.01) on gtt at 900 units/hr. No bleeding reported per RN.  Goal of Therapy:  Heparin level 0.3-0.7 units/ml Monitor platelets by anticoagulation protocol: Yes   Plan:  Decrease heparin infusion to 700 units/hr Re-check 8hr HL  Sherlon Handing, PharmD, BCPS Please see amion for complete clinical pharmacist phone list 06/15/2019 3:19 AM

## 2019-06-15 NOTE — Progress Notes (Signed)
Patient ambulated in room using front wheeled walker. Distance travelled approximately 40-50 feet. SpO2 decreased to 91% while on room air. No dyspnea reported or assessed during activity. Patient proceeded to bed for the night.

## 2019-06-15 NOTE — Progress Notes (Signed)
Inpatient Diabetes Program Recommendations  AACE/ADA: New Consensus Statement on Inpatient Glycemic Control (2015)  Target Ranges:  Prepandial:   less than 140 mg/dL      Peak postprandial:   less than 180 mg/dL (1-2 hours)      Critically ill patients:  140 - 180 mg/dL   Lab Results  Component Value Date   GLUCAP 325 (H) 06/15/2019   HGBA1C 9.6 (H) 06/13/2019    Review of Glycemic Control Results for Steve Martinez, Steve Martinez (MRN [id number]) as of 06/15/2019 15:30  Ref. Range 06/14/2019 12:12 06/14/2019 15:54 06/14/2019 20:37 06/15/2019 00:51 06/15/2019 07:31  Glucose-Capillary Latest Ref Range: 70 - 99 mg/dL 202 (H) 221 (H) 129 (H) 102 (H) 325 (H)   Diabetes history: DM 2 Outpatient Diabetes medications:  Novolog flexpen 20-30 units bid Current orders for Inpatient glycemic control:  Novolog 70/30 mix 25 units bid Novolog 4 units tid with meals Decadron 4 mg q 24 hours  Inpatient Diabetes Program Recommendations:    May consider d/c of Novolog meal coverage and instead increase Novolog 70/30 to 28 units bid (note that 70/30 mix has meal coverage built in).    Thanks,  Adah Perl, RN, BC-ADM Inpatient Diabetes Coordinator Pager [phone]

## 2019-06-16 ENCOUNTER — Inpatient Hospital Stay (HOSPITAL_COMMUNITY): Payer: Medicare HMO

## 2019-06-16 DIAGNOSIS — R0602 Shortness of breath: Secondary | ICD-10-CM

## 2019-06-16 LAB — COMPREHENSIVE METABOLIC PANEL
ALT: 33 U/L (ref 0–44)
AST: 53 U/L — ABNORMAL HIGH (ref 15–41)
Albumin: 2 g/dL — ABNORMAL LOW (ref 3.5–5.0)
Alkaline Phosphatase: 102 U/L (ref 38–126)
Anion gap: 10 (ref 5–15)
BUN: 73 mg/dL — ABNORMAL HIGH (ref 8–23)
CO2: 20 mmol/L — ABNORMAL LOW (ref 22–32)
Calcium: 8 mg/dL — ABNORMAL LOW (ref 8.9–10.3)
Chloride: 110 mmol/L (ref 98–111)
Creatinine, Ser: 4.57 mg/dL — ABNORMAL HIGH (ref 0.61–1.24)
GFR calc Af Amer: 13 mL/min — ABNORMAL LOW (ref >60)
GFR calc non Af Amer: 11 mL/min — ABNORMAL LOW (ref >60)
Glucose, Bld: 168 mg/dL — ABNORMAL HIGH (ref 70–99)
Potassium: 3.9 mmol/L (ref 3.5–5.1)
Sodium: 140 mmol/L (ref 135–145)
Total Bilirubin: 0.6 mg/dL (ref 0.3–1.2)
Total Protein: 6.9 g/dL (ref 6.5–8.1)

## 2019-06-16 LAB — CBC WITH DIFFERENTIAL/PLATELET
Abs Immature Granulocytes: 0.16 10*3/uL — ABNORMAL HIGH (ref 0.00–0.07)
Basophils Absolute: 0 10*3/uL (ref 0.0–0.1)
Basophils Relative: 0 %
Eosinophils Absolute: 0 10*3/uL (ref 0.0–0.5)
Eosinophils Relative: 0 %
HCT: 38.5 % — ABNORMAL LOW (ref 39.0–52.0)
Hemoglobin: 12 g/dL — ABNORMAL LOW (ref 13.0–17.0)
Immature Granulocytes: 1 %
Lymphocytes Relative: 4 %
Lymphs Abs: 0.6 10*3/uL — ABNORMAL LOW (ref 0.7–4.0)
MCH: 25.9 pg — ABNORMAL LOW (ref 26.0–34.0)
MCHC: 31.2 g/dL (ref 30.0–36.0)
MCV: 83 fL (ref 80.0–100.0)
Monocytes Absolute: 0.6 10*3/uL (ref 0.1–1.0)
Monocytes Relative: 4 %
Neutro Abs: 14.2 10*3/uL — ABNORMAL HIGH (ref 1.7–7.7)
Neutrophils Relative %: 91 %
Platelets: 261 10*3/uL (ref 150–400)
RBC: 4.64 MIL/uL (ref 4.22–5.81)
RDW: 15.8 % — ABNORMAL HIGH (ref 11.5–15.5)
WBC: 15.6 10*3/uL — ABNORMAL HIGH (ref 4.0–10.5)
nRBC: 0 % (ref 0.0–0.2)

## 2019-06-16 LAB — CBC
HCT: 42.6 % (ref 39.0–52.0)
Hemoglobin: 13.1 g/dL (ref 13.0–17.0)
MCH: 25.8 pg — ABNORMAL LOW (ref 26.0–34.0)
MCHC: 30.8 g/dL (ref 30.0–36.0)
MCV: 83.9 fL (ref 80.0–100.0)
Platelets: 372 10*3/uL (ref 150–400)
RBC: 5.08 MIL/uL (ref 4.22–5.81)
RDW: 15.9 % — ABNORMAL HIGH (ref 11.5–15.5)
WBC: 21.4 10*3/uL — ABNORMAL HIGH (ref 4.0–10.5)
nRBC: 0 % (ref 0.0–0.2)

## 2019-06-16 LAB — BRAIN NATRIURETIC PEPTIDE: B Natriuretic Peptide: 659.2 pg/mL — ABNORMAL HIGH (ref 0.0–100.0)

## 2019-06-16 LAB — ECHOCARDIOGRAM COMPLETE
Height: 73 in
Weight: 2720 oz

## 2019-06-16 LAB — GLUCOSE, CAPILLARY
Glucose-Capillary: 263 mg/dL — ABNORMAL HIGH (ref 70–99)
Glucose-Capillary: 320 mg/dL — ABNORMAL HIGH (ref 70–99)

## 2019-06-16 LAB — PROTIME-INR
INR: 2 — ABNORMAL HIGH (ref 0.8–1.2)
Prothrombin Time: 22.4 seconds — ABNORMAL HIGH (ref 11.4–15.2)

## 2019-06-16 LAB — C-REACTIVE PROTEIN: CRP: 10.6 mg/dL — ABNORMAL HIGH (ref <1.0)

## 2019-06-16 LAB — HEPARIN LEVEL (UNFRACTIONATED): Heparin Unfractionated: 0.37 IU/mL (ref 0.30–0.70)

## 2019-06-16 LAB — D-DIMER, QUANTITATIVE: D-Dimer, Quant: 11.55 ug/mL-FEU — ABNORMAL HIGH (ref 0.00–0.50)

## 2019-06-16 LAB — TSH: TSH: 0.31 u[IU]/mL — ABNORMAL LOW (ref 0.350–4.500)

## 2019-06-16 LAB — MAGNESIUM: Magnesium: 2 mg/dL (ref 1.7–2.4)

## 2019-06-16 LAB — PROCALCITONIN: Procalcitonin: 1.23 ng/mL

## 2019-06-16 MED ORDER — FUROSEMIDE 10 MG/ML IJ SOLN
40.0000 mg | Freq: Once | INTRAMUSCULAR | Status: AC
  Administered 2019-06-16: 40 mg via INTRAVENOUS
  Filled 2019-06-16: qty 4

## 2019-06-16 MED ORDER — DEXAMETHASONE SODIUM PHOSPHATE 4 MG/ML IJ SOLN
3.0000 mg | INTRAMUSCULAR | Status: DC
  Administered 2019-06-16 – 2019-06-17 (×2): 3 mg via INTRAVENOUS
  Filled 2019-06-16 (×2): qty 1

## 2019-06-16 MED ORDER — SODIUM CHLORIDE 0.9 % IV SOLN
3.0000 g | Freq: Two times a day (BID) | INTRAVENOUS | Status: DC
  Administered 2019-06-16 – 2019-06-18 (×4): 3 g via INTRAVENOUS
  Filled 2019-06-16 (×4): qty 8

## 2019-06-16 MED ORDER — FUROSEMIDE 10 MG/ML IJ SOLN
60.0000 mg | Freq: Once | INTRAMUSCULAR | Status: AC
  Administered 2019-06-16: 60 mg via INTRAVENOUS
  Filled 2019-06-16: qty 6

## 2019-06-16 MED ORDER — METOPROLOL TARTRATE 50 MG PO TABS
100.0000 mg | ORAL_TABLET | Freq: Two times a day (BID) | ORAL | Status: DC
  Administered 2019-06-16 – 2019-06-18 (×5): 100 mg via ORAL
  Filled 2019-06-16 (×2): qty 2
  Filled 2019-06-16: qty 4
  Filled 2019-06-16: qty 2

## 2019-06-16 MED ORDER — HYDRALAZINE HCL 50 MG PO TABS
50.0000 mg | ORAL_TABLET | Freq: Three times a day (TID) | ORAL | Status: DC
  Administered 2019-06-16 – 2019-06-17 (×4): 50 mg via ORAL
  Filled 2019-06-16 (×5): qty 2

## 2019-06-16 MED ORDER — WARFARIN SODIUM 2.5 MG PO TABS
2.5000 mg | ORAL_TABLET | Freq: Once | ORAL | Status: AC
  Administered 2019-06-16: 2.5 mg via ORAL
  Filled 2019-06-16: qty 1

## 2019-06-16 NOTE — Progress Notes (Addendum)
PROGRESS NOTE                                                                                                                                                                                                             Patient Demographics:    Steve Martinez, is a 78 y.o. male, DOB - Jan 14, 1941, CWC:[id number]  Outpatient Primary MD for the patient is Gaynelle Arabian, MD    LOS - 4  Admit date - 05/23/2019    Chief Complaint  Patient presents with  . Shortness of Breath  . COVID       Brief Narrative  Steve Martinez is a 78 y.o. male with medical history significant of hypertension, hyperlipidemia, diabetes mellitus type 2, GERD, CVA with left-sided weakness.  He presents with complaints of progressively worsening shortness of breath over the last week, was diagnosed with acute hypoxic respiratory failure due to COVID-19 pneumonia and admitted to [facility].   Subjective:   Patient in bed, appears comfortable, denies any headache, no fever, no chest pain or pressure, no shortness of breath , no abdominal pain. No focal weakness.   Assessment  & Plan :     1. Acute Hypoxic Resp. Failure due to Acute Covid 19 Viral Pneumonitis during the ongoing 2020 Covid 19 Pandemic - he has moderate to severe disease, currently on 3 L nasal cannula oxygen, has been started on IV steroids and remdesivir and seems to be doing okay, continue monitoring closely.  Encouraged the patient to sit up in chair in the daytime use I-S and flutter valve for pulmonary toiletry and then prone in bed when at night.    SpO2: 95 % O2 Flow Rate (L/min): 3 L/min  Recent Labs  Lab 06/14/2019 1324 06/13/19 0509 06/14/19 0005 06/15/19 0158 06/16/19 0432  CRP 17.2* 15.5* 10.2* 7.6* 10.6*  DDIMER >20.00* >20.00* >20.00* >20.00* 11.55*  FERRITIN 830* 639*  --   --   --   BNP  --   --  330.6* 505.8*  --   PROCALCITON 1.35 1.16 1.23 1.35  --     Hepatic Function  Latest Ref Rng & Units 06/16/2019 06/15/2019 06/14/2019  Total Protein 6.5 - 8.1 g/dL 6.9 6.3(L) 6.7  Albumin 3.5 - 5.0 g/dL 2.0(L) 2.0(L) 2.1(L)  AST 15 - 41 U/L 53(H) 59(H) 59(H)  ALT 0 - 44  U/L 33 34 32  Alk Phosphatase 38 - 126 U/L 102 89 80  Total Bilirubin 0.3 - 1.2 mg/dL 0.6 0.5 0.6     2.  Previous history of stroke.  Minimally verbal at baseline.  Supportive care.  Continue statin for secondary prevention.  3.  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  Per renal office his baseline creatinine on file is is between 4 and 4.8, he follows with Dr. Hollie Salk so certainly has underlying renal issues. Currently no hyperkalemia, no indication for dialysis will monitor.  4.  Dyslipidemia.  On Crestor.  5.  Hypertension.  Continue combination of Coreg and Norvasc.  Lasix on hold.  6.  Hypokalemia.  Replaced.  7.  Acute Bilateral Leg DVT - Hep gtt, pharmacy for Coumadin or NOAC if possible with his renal function.  8. Prolonged QTC, electrolytes stable, not on any prolonging medications. Monitor on beta-blocker.    9. DM type II.  On sliding scale.  Lab Results  Component Value Date   HGBA1C 9.6 (H) 06/13/2019    CBG (last 3)  Recent Labs    06/15/19 1548 06/15/19 2226 06/16/19 0855  GLUCAP 187* 62* 263*    Addendum.  After working with PT around 12 in the afternoon patient has become short of breath and is requiring oxygen, I went to see him again he appears to be in no distress having lunch but is certainly requiring oxygen about 10 to 12 L, he does have crackles on exam and said laying flat was becoming more difficult.  Had given him 40 of Lasix this morning we will give him another 60 IV.  We will also check echocardiogram, chest x-ray and a CBC.    Condition -guarded  Family Communication  :  Son 06/13/19, 06/16/2019.  Explained guarded prognosis.  Understands.  Code Status :  Full  Diet :   Diet Order            Diet Carb Modified Fluid consistency: Thin; Room  service appropriate? Yes  Diet effective now               Disposition Plan  :  TBD  Consults  :  None  Procedures  :    PUD Prophylaxis :   DVT Prophylaxis  :  Heparin    Lab Results  Component Value Date   PLT 261 06/16/2019    Inpatient Medications  Scheduled Meds: . albuterol  2 puff Inhalation Q6H  . amLODipine  10 mg Oral Daily  . vitamin C  500 mg Oral Daily  . carvedilol  6.25 mg Oral BID WC  . dexamethasone (DECADRON) injection  3 mg Intravenous Q24H  . hydrALAZINE  50 mg Oral Q8H  . insulin aspart  4 Units Subcutaneous TID WC  . insulin aspart protamine- aspart  25 Units Subcutaneous BID WC  . pantoprazole  40 mg Oral Daily  . rosuvastatin  40 mg Oral Daily  . sodium chloride flush  3 mL Intravenous Q12H  . warfarin  2.5 mg Oral ONCE-1800  . Warfarin - Pharmacist Dosing Inpatient   Does not apply q1800  . zinc sulfate  220 mg Oral Daily   Continuous Infusions: . famotidine (PEPCID) IV 20 mg (06/16/19 0903)  . heparin 550 Units/hr (06/16/19 0755)   PRN Meds:.acetaminophen, chlorpheniramine-HYDROcodone, guaiFENesin-dextromethorphan  Antibiotics  :    Anti-infectives (From admission, onward)   Start     Dose/Rate Route Frequency Ordered Stop   06/13/19 1000  remdesivir 100 mg in sodium chloride 0.9 % 100 mL IVPB     100 mg 200 mL/hr over 30 Minutes Intravenous Daily 06/16/2019 1612 06/16/19 1034   05/30/2019 1615  cefTRIAXone (ROCEPHIN) 2 g in sodium chloride 0.9 % 100 mL IVPB  Status:  Discontinued     2 g 200 mL/hr over 30 Minutes Intravenous Every 24 hours 06/02/2019 1611 06/13/19 0751   05/28/2019 1615  doxycycline (VIBRAMYCIN) 100 mg in sodium chloride 0.9 % 250 mL IVPB  Status:  Discontinued     100 mg 125 mL/hr over 120 Minutes Intravenous Every 12 hours 05/25/2019 1611 06/13/19 0751   06/01/2019 1615  remdesivir 200 mg in sodium chloride 0.9% 250 mL IVPB     200 mg 580 mL/hr over 30 Minutes Intravenous Once 06/09/2019 1612 05/31/2019 1755       Time  Spent in minutes  30   Lala Lund M.D on 06/16/2019 at 10:57 AM  To page go to [email or web address] - password [email or web address]  [facility] -  Office  [phone]   See all Orders from today for further details    Objective:   Vitals:   06/15/19 2338 06/16/19 0000 06/16/19 0300 06/16/19 0400  BP:  140/62  (!) 152/78  Pulse:  81    Resp: (!) 22  17 (!) 21  Temp:    98.9 F (37.2 C)  TempSrc:      SpO2:  95%    Weight:      Height:        Wt Readings from Last 3 Encounters:  06/19/2019 77.1 kg  05/21/19 78 kg  05/07/19 78 kg     Intake/Output Summary (Last 24 hours) at 06/16/2019 1057 Last data filed at 06/16/2019 0300 Gross per 24 hour  Intake 1123.32 ml  Output 700 ml  Net 423.32 ml     Physical Exam  Awake Alert, more verbal,  No new F.N deficits, flat affect [place].AT,PERRAL Supple Neck,No JVD, No cervical lymphadenopathy appriciated.  Symmetrical Chest wall movement, Good air movement bilaterally, CTAB RRR,No Gallops, Rubs or new Murmurs, No Parasternal Heave +ve B.Sounds, Abd Soft, No tenderness, No organomegaly appriciated, No rebound - guarding or rigidity. No Cyanosis, Clubbing or edema, No new Rash or bruise     Data Review:    CBC Recent Labs  Lab 06/01/2019 1324 06/13/19 0509 06/14/19 0005 06/15/19 0158 06/16/19 0432  WBC 8.9 8.6 12.5* 14.1* 15.6*  HGB 12.6* 11.8* 11.0* 11.4* 12.0*  HCT 42.2 39.7 35.8* 36.7* 38.5*  PLT 179 145* 202 216 261  MCV 86.1 85.9 84.0 83.0 83.0  MCH 25.7* 25.5* 25.8* 25.8* 25.9*  MCHC 29.9* 29.7* 30.7 31.1 31.2  RDW 15.3 15.4 15.2 15.5 15.8*  LYMPHSABS 0.6* 0.6* 0.6* 0.7 0.6*  MONOABS 0.5 0.3 0.3 0.6 0.6  EOSABS 0.0 0.0 0.0 0.0 0.0  BASOSABS 0.0 0.0 0.0 0.0 0.0    Chemistries  Recent Labs  Lab 05/23/2019 1324 06/10/2019 1904 06/13/19 0509 06/14/19 0005 06/15/19 0158 06/16/19 0432  NA 142 144 146* 140 142 140  K 3.4* 3.5 4.0 3.3* 3.9 3.9  CL 104 108 112* 109 108 110  CO2 _0 20* 19* 20*  GLUCOSE 554*  440* 203* 210* 120* 168*  BUN 51* 48* 52* 59* 68* 73*  CREATININE 4.67* 4.24* 3.68* 3.74* 4.47* 4.57*  CALCIUM 8.0* 7.8* 8.0* 7.7* 7.8* 8.0*  MG  --   --  1.9 2.2 1.9 2.0  AST 35  --  33 59*  59* 53*  ALT 24  --  22 32 34 33  ALKPHOS 72  --  70 80 89 102  BILITOT 1.2  --  0.4 0.6 0.5 0.6   ------------------------------------------------------------------------------------------------------------------ No results for input(s): CHOL, HDL, LDLCALC, TRIG, CHOLHDL, LDLDIRECT in the last 72 hours.  Lab Results  Component Value Date   HGBA1C 9.6 (H) 06/13/2019   ------------------------------------------------------------------------------------------------------------------ No results for input(s): TSH, T4TOTAL, T3FREE, THYROIDAB in the last 72 hours.  Invalid input(s): FREET3  Cardiac Enzymes No results for input(s): CKMB, TROPONINI, MYOGLOBIN in the last 168 hours.  Invalid input(s): CK ------------------------------------------------------------------------------------------------------------------    Component Value Date/Time   BNP 505.8 (H) 06/15/2019 0158    Micro Results Recent Results (from the past 240 hour(s))  Blood Culture (routine x 2)     Status: None (Preliminary result)   Collection Time: 05/31/2019  1:19 PM   Specimen: BLOOD  Result Value Ref Range Status   Specimen Description BLOOD SITE NOT SPECIFIED  Final   Special Requests   Final    BOTTLES DRAWN AEROBIC AND ANAEROBIC Blood Culture adequate volume   Culture   Final    NO GROWTH 4 DAYS Performed at [facility], [address], [place], [place] [address]    Report Status PENDING  Incomplete  Blood Culture (routine x 2)     Status: None (Preliminary result)   Collection Time: 06/06/2019  1:41 PM   Specimen: BLOOD  Result Value Ref Range Status   Specimen Description BLOOD LEFT ANTECUBITAL  Final   Special Requests   Final    BOTTLES DRAWN AEROBIC AND ANAEROBIC Blood Culture adequate volume    Culture   Final    NO GROWTH 4 DAYS Performed at [facility], [place] [address], [place], [place] [address]    Report Status PENDING  Incomplete  Urine culture     Status: Abnormal   Collection Time: 06/13/2019  7:04 PM   Specimen: Urine, Random  Result Value Ref Range Status   Specimen Description URINE, RANDOM  Final   Special Requests   Final    NONE Performed at [facility], [place] [address], [place], [place] [address]    Culture MULTIPLE SPECIES PRESENT, SUGGEST RECOLLECTION (A)  Final   Report Status 06/14/2019 FINAL  Final    Radiology Reports US RENAL  Result Date: 06/15/2019 CLINICAL DATA:  78 year old male COVID-10. Acute renal injury on chronic renal disease. EXAM: RENAL / URINARY TRACT ULTRASOUND COMPLETE COMPARISON:  Renal ultrasound 08/31/2018. FINDINGS: Right Kidney: Renal measurements: 10.0 x 4.9 x 6.1 centimeters = volume: 158 mL (previously 133). Chronically echogenic right kidney with no hydronephrosis or renal mass. Left Kidney: Renal measurements: 9.0 x 5.6 x 3.8 centimeters = volume: 98 mL (previously 153). Stable left renal cortex which also appears mildly echogenic. Cortical thickness seems better preserved in the left kidney. No left hydronephrosis or renal mass. Bladder: Appears normal for degree of bladder distention. Other: None. IMPRESSION: No acute renal findings and negative urinary bladder. Chronic medical renal disease suspected. Electronically Signed   By: Genevie Ann M.D.   On: 06/15/2019 08:50   DG Chest Port 1 View  Result Date: 06/05/2019 CLINICAL DATA:  Cough.  Shortness of breath.  Fever. EXAM: PORTABLE CHEST 1 VIEW COMPARISON:  Chest x-ray 02/03/2012, 06/16/2005. FINDINGS: Mediastinum hilar structures normal. Heart size normal. Diffuse bilateral interstitial prominence. Pneumonitis could present this fashion. Bibasilar atelectasis with right base alveolar infiltrate. No prominent pleural effusion or  pneumothorax. Stable elevation left  hemidiaphragm. IMPRESSION: 1. Diffuse bilateral interstitial prominence. Pneumonitis could present this fashion. Bibasilar atelectasis with right base alveolar infiltrate. 2.  Chronic elevation left hemidiaphragm. Electronically Signed   By: Marcello Moores  Register   On: 06/18/2019 13:32   Leg [place] Cone  Result Date: 06/13/2019  Lower Venous Study Indications: Elevated Ddimer.  Risk Factors: COVID 19 positive. Comparison Study: No prior studies. Performing Technologist: Oliver Hum RVT  Examination Guidelines: A complete evaluation includes B-mode imaging, spectral Doppler, color Doppler, and power Doppler as needed of all accessible portions of each vessel. Bilateral testing is considered an integral part of a complete examination. Limited examinations for reoccurring indications may be performed as noted.  +---------+---------------+---------+-----------+----------+--------------+ RIGHT    CompressibilityPhasicitySpontaneityPropertiesThrombus Aging +---------+---------------+---------+-----------+----------+--------------+ CFV      Full           Yes      Yes                                 +---------+---------------+---------+-----------+----------+--------------+ SFJ      Full                                                        +---------+---------------+---------+-----------+----------+--------------+ FV Prox  Full                                                        +---------+---------------+---------+-----------+----------+--------------+ FV Mid   Full                                                        +---------+---------------+---------+-----------+----------+--------------+ FV DistalFull                                                        +---------+---------------+---------+-----------+----------+--------------+ PFV      Full                                                         +---------+---------------+---------+-----------+----------+--------------+ POP      Full           Yes      Yes                                 +---------+---------------+---------+-----------+----------+--------------+ PTV      None                                         Acute          +---------+---------------+---------+-----------+----------+--------------+  PERO     Full                                                        +---------+---------------+---------+-----------+----------+--------------+   +---------+---------------+---------+-----------+----------+--------------+ LEFT     CompressibilityPhasicitySpontaneityPropertiesThrombus Aging +---------+---------------+---------+-----------+----------+--------------+ CFV      Full           Yes      Yes                                 +---------+---------------+---------+-----------+----------+--------------+ SFJ      Full                                                        +---------+---------------+---------+-----------+----------+--------------+ FV Prox  Full                                                        +---------+---------------+---------+-----------+----------+--------------+ FV Mid   Full                                                        +---------+---------------+---------+-----------+----------+--------------+ FV DistalFull                                                        +---------+---------------+---------+-----------+----------+--------------+ PFV      Full                                                        +---------+---------------+---------+-----------+----------+--------------+ POP      Full           Yes      Yes                                 +---------+---------------+---------+-----------+----------+--------------+ PTV      Full                                                         +---------+---------------+---------+-----------+----------+--------------+ PERO     Full                                                        +---------+---------------+---------+-----------+----------+--------------+  SSV      None                                         Acute          +---------+---------------+---------+-----------+----------+--------------+     Summary: Right: Findings consistent with acute deep vein thrombosis involving the right peroneal veins. No cystic structure found in the popliteal fossa. Left: Findings consistent with acute superficial vein thrombosis involving the left small saphenous vein. No cystic structure found in the popliteal fossa.  *See table(s) above for measurements and observations. Electronically signed by Monica Martinez MD on 06/13/2019 at 5:23:13 PM.    Final

## 2019-06-16 NOTE — Progress Notes (Signed)
ANTICOAGULATION CONSULT NOTE   Pharmacy Consult for Heparin Indication: BL acute  DVT  12/24   No Known Allergies  Patient Measurements: Height: 6\' 1"  (185.4 cm) Weight: 170 lb (77.1 kg) IBW/kg (Calculated) : 79.9 Heparin Dosing Weight: 77 kg  Vital Signs: Temp: 98.9 F (37.2 C) (12/27 0400) BP: 152/78 (12/27 0400) Pulse Rate: 81 (12/27 0000)  Labs: Recent Labs    06/13/19 1248 06/14/19 0005 06/15/19 0158 06/15/19 1100 06/15/19 2153 06/16/19 0432  HGB  --  11.0* 11.4*  --   --   --   HCT  --  35.8* 36.7*  --   --   --   PLT  --  202 216  --   --   --   APTT 108*  --   --   --   --   --   LABPROT 20.5*  --   --   --   --  22.4*  INR 1.8*  --   --   --   --  2.0*  HEPARINUNFRC  --   --  1.01* 0.92* 0.44 0.37  CREATININE  --  3.74* 4.47*  --   --   --     Estimated Creatinine Clearance: 14.9 mL/min (A) (by C-G formula based on SCr of 4.47 mg/dL (H)).   Assessment: 78 y/o M admited with respiratory failure due to COVID-PNA on heparin 7500 unit [place] q 8 h for VTE ppx. D-dimer is extremely elevated, 12/24 LE US shows acute DVT involving the R peroneal veins and acute superficial vein thrombosis in the L small saphenous vein.  Heparin level remains therapeutic (0.37) on gtt at 550 units/hr. INR 2 (low end of therapeutic) - coumadin started 12/26 pm. Typically would want 5 day overlap for VTE - today is D#2/5 minimum overlap.  Goal of Therapy:  Heparin level 0.3-0.7 units/ml  INR 2-3 Monitor platelets by anticoagulation protocol: Yes   Plan:  Continue heparin 550 units/hr Coumadin 2.5mg  po tonight F/u daily INR, heparin level, and CBC Will need coumadin education prior to d/c  Sherlon Handing, PharmD, BCPS Please see amion for complete clinical pharmacist phone list 06/16/2019 6:10 AM

## 2019-06-16 NOTE — Evaluation (Addendum)
Occupational Therapy Evaluation Patient Details Name: Steve Martinez MRN: [id number] DOB: 08-27-1940 Today's Date: 06/16/2019    History of Present Illness 78 y.o male w/ hx of CVA, MI, HTN, HLD, bronchitis, GERD, DM, B cataracts, back pain. Pt also w/ hx of shoulder surgery, refractive surgery abd cardiac cath. pt presented to hospital w/ c/o progressiverly worsening SOB over a week and was dx with acute hypoxic respiratory failure ude to COVID 19 PNA.   Clinical Impression   This 78 y/o male presents with the above. PTA pt reports independence with ADL, iADL and functional mobility, living with spouse and son. Pt currently presenting with weakness, decreased endurance and poor respiratory status impacting his functional performance. Pt with increased O2 needs during this session compared to recent; pt on RA upon arrival to room and noted O2 probe had fallen off, reports feeling mildly short of breath. New O2 probe applied and O2 sats grossly 86%, donned nasal cannula and with 2L O2 SpO2 up to 88%. Pt tolerating short distance mobility in room using RW with overall minA (to door and back to recliner), with activity HR up to 130 and SpO2 down to 81%, increased to 4L O2 with some recovery, again bumped to 6L with O2 up to 87% and then briefly 8L with O2 sats returning to 88%, able to titrate back to 6L end of session with SpO2 88-89%, HR in the low 100s; made RN aware of increased O2 needs and RN in to assess end of session. Pt requiring max cues for breathing techniques throughout, trialling both earlobe and finger O2 probe. Pt reports breathing feels better seated in recliner end of session compared to start of session when in bed and in no apparent distress end of session. He will benefit from continued acute OT services and currently recommend follow up [facility] services after discharge to progress pt towards his PLOF.     Follow Up Recommendations  Home health OT;Supervision/Assistance - 24 hour     Equipment Recommendations  None recommended by OT           Precautions / Restrictions Precautions Precautions: Fall Restrictions Weight Bearing Restrictions: No      Mobility Bed Mobility Overal bed mobility: Needs Assistance Bed Mobility: Supine to Sit     Supine to sit: Min guard;HOB elevated     General bed mobility comments: for safety and line management, no physical assist required  Transfers Overall transfer level: Needs assistance Equipment used: Rolling walker (2 wheeled) Transfers: Sit to/from Stand Sit to Stand: Min assist         General transfer comment: boosting assist to rise and steady at RW    Balance Overall balance assessment: Needs assistance Sitting-balance support: Feet supported Sitting balance-Leahy Scale: Fair     Standing balance support: Bilateral upper extremity supported Standing balance-Leahy Scale: Poor Standing balance comment: reliant on UE support                           ADL either performed or assessed with clinical judgement   ADL Overall ADL's : Needs assistance/impaired Eating/Feeding: Set up;Sitting   Grooming: Set up;Min guard;Sitting   Upper Body Bathing: Min guard;Sitting   Lower Body Bathing: Minimal assistance;Sit to/from stand   Upper Body Dressing : Min guard;Set up;Sitting   Lower Body Dressing: Minimal assistance;Moderate assistance;Sit to/from stand   Toilet Transfer: Minimal assistance;Ambulation;RW Toilet Transfer Details (indicate cue type and reason): simulated via transfer to recliner, short  distance mobility with RW Toileting- Clothing Manipulation and Hygiene: Minimal assistance;Sit to/from stand Toileting - Clothing Manipulation Details (indicate cue type and reason): pt just completed using urinal at bed level upon arrival to room, using urinal ad lib     Functional mobility during ADLs: Minimal assistance;Rolling walker General ADL Comments: pt with decreased respiratory  status this session and with increased O2 needs, overall presenting with weakness, decreased standing balance, decreased endurance     Vision         Perception     Praxis      Pertinent Vitals/Pain Pain Assessment: No/denies pain     Hand Dominance Right   Extremity/Trunk Assessment Upper Extremity Assessment Upper Extremity Assessment: Generalized weakness   Lower Extremity Assessment Lower Extremity Assessment: Defer to PT evaluation       Communication Communication Communication: No difficulties   Cognition Arousal/Alertness: Awake/alert Behavior During Therapy: WFL for tasks assessed/performed Overall Cognitive Status: No family/caregiver present to determine baseline cognitive functioning                                 General Comments: seems to be slow processing especially when completing breathing exercises needing continued cues and redicrection to complete exercises   General Comments  pt with brief period of time in afib post transfer to recliner, increased O2 needs this date with RN made aware and in to assess pt end of session     Exercises     Shoulder Instructions      Home Living Family/patient expects to be discharged to:: Private residence Living Arrangements: Spouse/significant other;Children Available Help at Discharge: Family Type of Home: House Home Access: Stairs to enter Technical brewer of Steps: 5 Entrance Stairs-Rails: Can reach both Home Layout: One level     Bathroom Shower/Tub: Teacher, early years/pre: Handicapped height Bathroom Accessibility: Yes   Home Equipment: Environmental consultant - 2 wheels;Cane - single point;Bedside commode;Grab bars - tub/shower;Tub bench;Wheelchair - manual          Prior Functioning/Environment Level of Independence: Independent                 OT Problem List: Decreased strength;Decreased activity tolerance;Impaired balance (sitting and/or standing);Decreased safety  awareness;Decreased knowledge of use of DME or AE;Decreased knowledge of precautions;Cardiopulmonary status limiting activity      OT Treatment/Interventions: Self-care/ADL training;Therapeutic exercise;Energy conservation;DME and/or AE instruction;Therapeutic activities;Cognitive remediation/compensation;Patient/family education;Balance training    OT Goals(Current goals can be found in the care plan section) Acute Rehab OT Goals Patient Stated Goal: to get better and go home  OT Goal Formulation: With patient Time For Goal Achievement: 06/30/19 Potential to Achieve Goals: Good  OT Frequency: Min 2X/week   Barriers to D/C:            Co-evaluation              AM-PAC OT "6 Clicks" Daily Activity     Outcome Measure Help from another person eating meals?: None Help from another person taking care of personal grooming?: A Little Help from another person toileting, which includes using toliet, bedpan, or urinal?: A Lot Help from another person bathing (including washing, rinsing, drying)?: A Lot Help from another person to put on and taking off regular upper body clothing?: None Help from another person to put on and taking off regular lower body clothing?: A Lot 6 Click Score: 17   End of Session Equipment Utilized  During Treatment: Oxygen;Rolling walker Nurse Communication: Mobility status;Other (comment)(need for increased O2)  Activity Tolerance: Patient tolerated treatment well;Other (comment)(increased O2 needs with activity today) Patient left: in chair;with call bell/phone within reach;with nursing/sitter in room  OT Visit Diagnosis: Unsteadiness on feet (R26.81);Muscle weakness (generalized) (M62.81)                Time: [id number] OT Time Calculation (min): 53 min Charges:  OT General Charges $OT Visit: 1 Visit OT Evaluation $OT Eval Moderate Complexity: 1 Mod OT Treatments $Self Care/Home Management : 23-37 mins  Lou Cal, OT Supplemental  Rehabilitation Services Pager [phone] Office [phone]   Raymondo Band 06/16/2019, 12:45 PM

## 2019-06-16 NOTE — Progress Notes (Signed)
  Echocardiogram 2D Echocardiogram has been performed.  Steve Martinez 06/16/2019, 3:23 PM

## 2019-06-16 NOTE — Plan of Care (Signed)
Patient with increased 02 needs today after working with OT. Requiring HFNC. MD made aware, assessed patient, added another dose of lasix and and ECHO. Patient continues to require HFNC after lasix dose; no acute distress, respiratory rate in the 20s. Will continue to monitor.   Problem: Education: Goal: Knowledge of risk factors and measures for prevention of condition will improve Outcome: Progressing   Problem: Coping: Goal: Psychosocial and spiritual needs will be supported Outcome: Progressing   Problem: Respiratory: Goal: Complications related to the disease process, condition or treatment will be avoided or minimized Outcome: Progressing   Problem: Respiratory: Goal: Will maintain a patent airway Outcome: Not Progressing

## 2019-06-16 NOTE — Progress Notes (Signed)
Pharmacy Antibiotic Note  Steve Martinez is a 78 y.o. male admitted on 06/10/2019 with pneumonia.  Pharmacy has been consulted for Unasyn dosing.  Pt has been here for COVID. Adding Unasyn today due to concern for asp PNA. He has very poor renal function.   Plan: Unasyn 3g IV q12 F/u with LOT  Height: 6\' 1"  (185.4 cm) Weight: 170 lb (77.1 kg) IBW/kg (Calculated) : 79.9  Temp (24hrs), Avg:99 F (37.2 C), Min:98.6 F (37 C), Max:99.5 F (37.5 C)  Recent Labs  Lab 06/09/2019 1324 06/10/2019 1904 06/13/19 0509 06/14/19 0005 06/15/19 0158 06/16/19 0432 06/16/19 1315  WBC 8.9  --  8.6 12.5* 14.1* 15.6* 21.4*  CREATININE 4.67* 4.24* 3.68* 3.74* 4.47* 4.57*  --   LATICACIDVEN 2.3* 2.2*  --   --   --   --   --     Estimated Creatinine Clearance: 14.5 mL/min (A) (by C-G formula based on SCr of 4.57 mg/dL (H)).    No Known Allergies  Antimicrobials this admission:   Dose adjustments this admission:  Microbiology results: 12/23 blood>>ngtd 12/23 urine>>contaminant  Onnie Boer, PharmD, BCIDP, AAHIVP, CPP Infectious Disease Pharmacist 06/16/2019 4:23 PM

## 2019-06-16 NOTE — TOC Progression Note (Signed)
Transition of Care [facility]) - Progression Note    Patient Details  Name: Trampus Barefield MRN: [id number] Date of Birth: 1940-08-19  Transition of Care [facility]) CM/SW [place], LCSW Phone Number: 06/16/2019, 9:27 AM  Clinical Narrative:     Patient set up with [facility]- referral accepted by [place]. Orders for [facility] will need to be place by MD. Should have no other needs.   Expected Discharge Plan: [place] Barriers to Discharge: Continued Medical Work up  Expected Discharge Plan and Services Expected Discharge Plan: [place] In-house Referral: Clinical Social Work     Living arrangements for the past 2 months: Single Family Home                                       Social Determinants of Health (SDOH) Interventions    Readmission Risk Interventions Readmission Risk Prevention Plan 06/15/2019  Transportation Screening Complete  Palliative Care Screening Not Applicable  Medication Review (RN Care Manager) Complete  Some recent data might be hidden

## 2019-06-17 ENCOUNTER — Inpatient Hospital Stay (HOSPITAL_COMMUNITY): Payer: Medicare HMO

## 2019-06-17 ENCOUNTER — Encounter (HOSPITAL_COMMUNITY): Payer: Self-pay | Admitting: Internal Medicine

## 2019-06-17 LAB — D-DIMER, QUANTITATIVE: D-Dimer, Quant: 9.62 ug/mL-FEU — ABNORMAL HIGH (ref 0.00–0.50)

## 2019-06-17 LAB — COMPREHENSIVE METABOLIC PANEL
ALT: 31 U/L (ref 0–44)
AST: 54 U/L — ABNORMAL HIGH (ref 15–41)
Albumin: 2.2 g/dL — ABNORMAL LOW (ref 3.5–5.0)
Alkaline Phosphatase: 123 U/L (ref 38–126)
Anion gap: 15 (ref 5–15)
BUN: 88 mg/dL — ABNORMAL HIGH (ref 8–23)
CO2: 20 mmol/L — ABNORMAL LOW (ref 22–32)
Calcium: 8.4 mg/dL — ABNORMAL LOW (ref 8.9–10.3)
Chloride: 110 mmol/L (ref 98–111)
Creatinine, Ser: 5.26 mg/dL — ABNORMAL HIGH (ref 0.61–1.24)
GFR calc Af Amer: 11 mL/min — ABNORMAL LOW (ref >60)
GFR calc non Af Amer: 10 mL/min — ABNORMAL LOW (ref >60)
Glucose, Bld: 210 mg/dL — ABNORMAL HIGH (ref 70–99)
Potassium: 4.2 mmol/L (ref 3.5–5.1)
Sodium: 145 mmol/L (ref 135–145)
Total Bilirubin: 0.8 mg/dL (ref 0.3–1.2)
Total Protein: 7.2 g/dL (ref 6.5–8.1)

## 2019-06-17 LAB — CBC WITH DIFFERENTIAL/PLATELET
Abs Immature Granulocytes: 0.18 10*3/uL — ABNORMAL HIGH (ref 0.00–0.07)
Basophils Absolute: 0.1 10*3/uL (ref 0.0–0.1)
Basophils Relative: 0 %
Eosinophils Absolute: 0 10*3/uL (ref 0.0–0.5)
Eosinophils Relative: 0 %
HCT: 41.7 % (ref 39.0–52.0)
Hemoglobin: 13.2 g/dL (ref 13.0–17.0)
Immature Granulocytes: 1 %
Lymphocytes Relative: 3 %
Lymphs Abs: 0.6 10*3/uL — ABNORMAL LOW (ref 0.7–4.0)
MCH: 25.7 pg — ABNORMAL LOW (ref 26.0–34.0)
MCHC: 31.7 g/dL (ref 30.0–36.0)
MCV: 81.3 fL (ref 80.0–100.0)
Monocytes Absolute: 0.8 10*3/uL (ref 0.1–1.0)
Monocytes Relative: 4 %
Neutro Abs: 17.4 10*3/uL — ABNORMAL HIGH (ref 1.7–7.7)
Neutrophils Relative %: 92 %
Platelets: 322 10*3/uL (ref 150–400)
RBC: 5.13 MIL/uL (ref 4.22–5.81)
RDW: 15.9 % — ABNORMAL HIGH (ref 11.5–15.5)
WBC: 19.1 10*3/uL — ABNORMAL HIGH (ref 4.0–10.5)
nRBC: 0 % (ref 0.0–0.2)

## 2019-06-17 LAB — GLUCOSE, CAPILLARY
Glucose-Capillary: 100 mg/dL — ABNORMAL HIGH (ref 70–99)
Glucose-Capillary: 113 mg/dL — ABNORMAL HIGH (ref 70–99)
Glucose-Capillary: 116 mg/dL — ABNORMAL HIGH (ref 70–99)
Glucose-Capillary: 131 mg/dL — ABNORMAL HIGH (ref 70–99)
Glucose-Capillary: 143 mg/dL — ABNORMAL HIGH (ref 70–99)
Glucose-Capillary: 145 mg/dL — ABNORMAL HIGH (ref 70–99)
Glucose-Capillary: 162 mg/dL — ABNORMAL HIGH (ref 70–99)
Glucose-Capillary: 169 mg/dL — ABNORMAL HIGH (ref 70–99)
Glucose-Capillary: 172 mg/dL — ABNORMAL HIGH (ref 70–99)
Glucose-Capillary: 180 mg/dL — ABNORMAL HIGH (ref 70–99)
Glucose-Capillary: 240 mg/dL — ABNORMAL HIGH (ref 70–99)
Glucose-Capillary: 244 mg/dL — ABNORMAL HIGH (ref 70–99)
Glucose-Capillary: 245 mg/dL — ABNORMAL HIGH (ref 70–99)
Glucose-Capillary: 263 mg/dL — ABNORMAL HIGH (ref 70–99)
Glucose-Capillary: 52 mg/dL — ABNORMAL LOW (ref 70–99)
Glucose-Capillary: 57 mg/dL — ABNORMAL LOW (ref 70–99)
Glucose-Capillary: 78 mg/dL (ref 70–99)
Glucose-Capillary: 90 mg/dL (ref 70–99)
Glucose-Capillary: 98 mg/dL (ref 70–99)

## 2019-06-17 LAB — CULTURE, BLOOD (ROUTINE X 2)
Culture: NO GROWTH
Culture: NO GROWTH
Special Requests: ADEQUATE
Special Requests: ADEQUATE

## 2019-06-17 LAB — PROCALCITONIN: Procalcitonin: 1.66 ng/mL

## 2019-06-17 LAB — PROTIME-INR
INR: 2 — ABNORMAL HIGH (ref 0.8–1.2)
Prothrombin Time: 22.8 seconds — ABNORMAL HIGH (ref 11.4–15.2)

## 2019-06-17 LAB — MAGNESIUM: Magnesium: 2.3 mg/dL (ref 1.7–2.4)

## 2019-06-17 LAB — C-REACTIVE PROTEIN: CRP: 15 mg/dL — ABNORMAL HIGH (ref <1.0)

## 2019-06-17 LAB — HEPARIN LEVEL (UNFRACTIONATED): Heparin Unfractionated: 0.2 IU/mL — ABNORMAL LOW (ref 0.30–0.70)

## 2019-06-17 LAB — BRAIN NATRIURETIC PEPTIDE: B Natriuretic Peptide: 809.7 pg/mL — ABNORMAL HIGH (ref 0.0–100.0)

## 2019-06-17 MED ORDER — DEXTROSE 50 % IV SOLN
25.0000 g | INTRAVENOUS | Status: DC | PRN
  Administered 2019-06-17: 20:00:00 25 g via INTRAVENOUS
  Filled 2019-06-17: qty 50

## 2019-06-17 MED ORDER — DEXTROSE 50 % IV SOLN: 12.5000 g | INTRAVENOUS | Status: DC | PRN

## 2019-06-17 MED ORDER — WARFARIN SODIUM 2.5 MG PO TABS
2.5000 mg | ORAL_TABLET | Freq: Once | ORAL | Status: AC
  Administered 2019-06-17: 2.5 mg via ORAL
  Filled 2019-06-17: qty 1

## 2019-06-17 NOTE — Progress Notes (Signed)
RN informed patients BG is 66, RN paged Dr. Vanita Ingles awaiting order. No PRN medications available

## 2019-06-17 NOTE — Progress Notes (Signed)
Inpatient Diabetes Program Recommendations  AACE/ADA: New Consensus Statement on Inpatient Glycemic Control (2015)  Target Ranges:  Prepandial:   less than 140 mg/dL      Peak postprandial:   less than 180 mg/dL (1-2 hours)      Critically ill patients:  140 - 180 mg/dL   Lab Results  Component Value Date   GLUCAP 263 (H) 06/17/2019   HGBA1C 9.6 (H) 06/13/2019    Review of Glycemic Control  Blood sugars above goal of 140-180 mg/dL.  Needs titration of 70/30 insulin.  Inpatient Diabetes Program Recommendations:     Increase 70/30 to 28-30 units bid.   Will follow closely.  Thank you. Lorenda Peck, RD, LDN, CDE Inpatient Diabetes Coordinator [phone]

## 2019-06-17 NOTE — Progress Notes (Signed)
PROGRESS NOTE                                                                                                                                                                                                             Patient Demographics:    Steve Martinez, is a 78 y.o. male, DOB - Oct 14, 1940, MOQ:[id number]  Outpatient Primary MD for the patient is Gaynelle Arabian, MD    LOS - 5  Admit date - 06/02/2019    Chief Complaint  Patient presents with  . Shortness of Breath  . COVID       Brief Narrative  Steve Martinez is a 78 y.o. male with medical history significant of hypertension, hyperlipidemia, diabetes mellitus type 2, GERD, CVA with left-sided weakness.  He presents with complaints of progressively worsening shortness of breath over the last week, was diagnosed with acute hypoxic respiratory failure due to COVID-19 pneumonia and admitted to [facility].   Subjective:   Patient in bed denies any headache chest or abdominal pain, breathing is somewhat better than yesterday.   Assessment  & Plan :     1. Acute Hypoxic Resp. Failure due to Acute Covid 19 Viral Pneumonitis during the ongoing 2020 Covid 19 Pandemic - he has moderate to severe disease, currently on 3 L nasal cannula oxygen, has been started on IV steroids and remdesivir and seems to be doing okay, continue monitoring closely.    Unfortunately clinically it appears that patient has likely aspirated on 06/16/2019, advised not to have meals in bed, staff also educated, speech therapy reconsulted, soft diet, Unasyn and monitor.  Encouraged the patient to sit up in chair in the daytime use I-S and flutter valve for pulmonary toiletry and then prone in bed when at night.    SpO2: 92 % O2 Flow Rate (L/min): 7 L/min  Recent Labs  Lab 06/17/2019 1324 06/13/19 0509 06/14/19 0005 06/15/19 0158 06/16/19 0432 06/16/19 1655 06/17/19 0400  CRP 17.2* 15.5* 10.2* 7.6* 10.6*   --  15.0*  DDIMER >20.00* >20.00* >20.00* >20.00* 11.55*  --  9.62*  FERRITIN 830* 639*  --   --   --   --   --   BNP  --   --  330.6* 505.8* 659.2*  --  809.7*  PROCALCITON 1.35 1.16 1.23 1.35  --  1.23 1.66  Hepatic Function Latest Ref Rng & Units 06/17/2019 06/16/2019 06/15/2019  Total Protein 6.5 - 8.1 g/dL 7.2 6.9 6.3(L)  Albumin 3.5 - 5.0 g/dL 2.2(L) 2.0(L) 2.0(L)  AST 15 - 41 U/L 54(H) 53(H) 59(H)  ALT 0 - 44 U/L 31 33 34  Alk Phosphatase 38 - 126 U/L 123 102 89  Total Bilirubin 0.3 - 1.2 mg/dL 0.8 0.6 0.5     2.  Previous history of stroke.  Minimally verbal at baseline.  Supportive care.  Continue statin for secondary prevention.  3.  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  Per renal office his baseline creatinine on file is is between 4 and 4.8, he follows with Dr. Hollie Salk so certainly has underlying renal issues. Currently no hyperkalemia, no indication for dialysis will monitor.  Have consulted nephrology to help as his fluid management could be slightly tricky.  4.  Dyslipidemia.  On Crestor.  5.  Hypertension.  Continue combination of Coreg and Norvasc.  Lasix on hold.  6.  Hypokalemia.  Replaced.  7.  Acute Bilateral Leg DVT - Hep gtt, pharmacy for Coumadin or NOAC if possible with his renal function.  Lab Results  Component Value Date   INR 2.0 (H) 06/17/2019   INR 2.0 (H) 06/16/2019   INR 1.8 (H) 06/13/2019   8. Prolonged QTC, electrolytes stable, not on any prolonging medications. Monitor on beta-blocker.    9.  Aspiration pneumonia developed 06/16/2019.  Speech consult, soft diet, keep head of the bed elevated, all meals in chair, Unasyn and monitor.    10. DM type II.  On sliding scale.  Lab Results  Component Value Date   HGBA1C 9.6 (H) 06/13/2019    CBG (last 3)  Recent Labs    06/17/19 0420 06/17/19 0727 06/17/19 0816  GLUCAP 245* 240* 263*     Condition -guarded  Family Communication  :  Son 06/13/19, 06/16/2019.  Explained guarded  prognosis.  Understands.  Code Status :  Full  Diet :   Diet Order            DIET SOFT Room service appropriate? Yes; Fluid consistency: Thin  Diet effective now               Disposition Plan  :  TBD  Consults  :  None  Procedures  :    PUD Prophylaxis :   DVT Prophylaxis  :  Heparin    Lab Results  Component Value Date   PLT 322 06/17/2019    Inpatient Medications  Scheduled Meds: . albuterol  2 puff Inhalation Q6H  . amLODipine  10 mg Oral Daily  . vitamin C  500 mg Oral Daily  . dexamethasone (DECADRON) injection  3 mg Intravenous Q24H  . hydrALAZINE  50 mg Oral Q8H  . insulin aspart  4 Units Subcutaneous TID WC  . insulin aspart protamine- aspart  25 Units Subcutaneous BID WC  . metoprolol tartrate  100 mg Oral BID  . pantoprazole  40 mg Oral Daily  . rosuvastatin  40 mg Oral Daily  . sodium chloride flush  3 mL Intravenous Q12H  . warfarin  2.5 mg Oral ONCE-1800  . Warfarin - Pharmacist Dosing Inpatient   Does not apply q1800  . zinc sulfate  220 mg Oral Daily   Continuous Infusions: . ampicillin-sulbactam (UNASYN) IV 3 g (06/17/19 0452)  . famotidine (PEPCID) IV 20 mg (06/17/19 0830)  . heparin 650 Units/hr (06/17/19 1138)   PRN Meds:.acetaminophen, chlorpheniramine-HYDROcodone,  guaiFENesin-dextromethorphan  Antibiotics  :    Anti-infectives (From admission, onward)   Start     Dose/Rate Route Frequency Ordered Stop   06/16/19 1700  Ampicillin-Sulbactam (UNASYN) 3 g in sodium chloride 0.9 % 100 mL IVPB     3 g 200 mL/hr over 30 Minutes Intravenous Every 12 hours 06/16/19 1621     06/13/19 1000  remdesivir 100 mg in sodium chloride 0.9 % 100 mL IVPB     100 mg 200 mL/hr over 30 Minutes Intravenous Daily 06/19/2019 1612 06/16/19 1104   05/27/2019 1615  cefTRIAXone (ROCEPHIN) 2 g in sodium chloride 0.9 % 100 mL IVPB  Status:  Discontinued     2 g 200 mL/hr over 30 Minutes Intravenous Every 24 hours 06/02/2019 1611 06/13/19 0751   05/30/2019 1615   doxycycline (VIBRAMYCIN) 100 mg in sodium chloride 0.9 % 250 mL IVPB  Status:  Discontinued     100 mg 125 mL/hr over 120 Minutes Intravenous Every 12 hours 06/10/2019 1611 06/13/19 0751   05/23/2019 1615  remdesivir 200 mg in sodium chloride 0.9% 250 mL IVPB     200 mg 580 mL/hr over 30 Minutes Intravenous Once 05/31/2019 1612 06/14/2019 1755       Time Spent in minutes  30   Lala Lund M.D on 06/17/2019 at 11:41 AM  To page go to [email or web address] - password [facility]  [facility] -  Office  [phone]   See all Orders from today for further details    Objective:   Vitals:   06/17/19 0400 06/17/19 0559 06/17/19 0730 06/17/19 0812  BP: 128/63 132/64 131/61 139/60  Pulse: 82   85  Resp: (!) 24  (!) 22   Temp:   98.2 F (36.8 C)   TempSrc: Oral     SpO2: 91%   92%  Weight:      Height:        Wt Readings from Last 3 Encounters:  06/07/2019 77.1 kg  05/21/19 78 kg  05/07/19 78 kg     Intake/Output Summary (Last 24 hours) at 06/17/2019 1141 Last data filed at 06/17/2019 0600 Gross per 24 hour  Intake 840 ml  Output 1975 ml  Net -1135 ml     Physical Exam  Awake Alert, more verbal,  No new F.N deficits, flat affect Awake Alert, Oriented X 3, No new F.N deficits, Normal affect [place].AT,PERRAL Supple Neck,No JVD, No cervical lymphadenopathy appriciated.  Symmetrical Chest wall movement, Good air movement bilaterally, CTAB RRR,No Gallops, Rubs or new Murmurs, No Parasternal Heave +ve B.Sounds, Abd Soft, No tenderness, No organomegaly appriciated, No rebound - guarding or rigidity. No Cyanosis, Clubbing or edema, No new Rash or bruise    Data Review:    CBC Recent Labs  Lab 06/13/19 0509 06/14/19 0005 06/15/19 0158 06/16/19 0432 06/16/19 1315 06/17/19 0400  WBC 8.6 12.5* 14.1* 15.6* 21.4* 19.1*  HGB 11.8* 11.0* 11.4* 12.0* 13.1 13.2  HCT 39.7 35.8* 36.7* 38.5* 42.6 41.7  PLT 145* 202 216 261 372 322  MCV 85.9 84.0 83.0 83.0 83.9 81.3  MCH 25.5* 25.8*  25.8* 25.9* 25.8* 25.7*  MCHC 29.7* 30.7 31.1 31.2 30.8 31.7  RDW 15.4 15.2 15.5 15.8* 15.9* 15.9*  LYMPHSABS 0.6* 0.6* 0.7 0.6*  --  0.6*  MONOABS 0.3 0.3 0.6 0.6  --  0.8  EOSABS 0.0 0.0 0.0 0.0  --  0.0  BASOSABS 0.0 0.0 0.0 0.0  --  0.1    Chemistries  Recent Labs  Lab  06/13/19 0509 06/14/19 0005 06/15/19 0158 06/16/19 0432 06/17/19 0400  NA 146* 140 142 140 145  K 4.0 3.3* 3.9 3.9 4.2  CL 112* 109 108 110 110  CO2 23 20* 19* 20* 20*  GLUCOSE 203* 210* 120* 168* 210*  BUN 52* 59* 68* 73* 88*  CREATININE 3.68* 3.74* 4.47* 4.57* 5.26*  CALCIUM 8.0* 7.7* 7.8* 8.0* 8.4*  MG 1.9 2.2 1.9 2.0 2.3  AST 33 59* 59* 53* 54*  ALT 22 32 34 33 31  ALKPHOS 70 80 89 102 123  BILITOT 0.4 0.6 0.5 0.6 0.8   ------------------------------------------------------------------------------------------------------------------ No results for input(s): CHOL, HDL, LDLCALC, TRIG, CHOLHDL, LDLDIRECT in the last 72 hours.  Lab Results  Component Value Date   HGBA1C 9.6 (H) 06/13/2019   ------------------------------------------------------------------------------------------------------------------ Recent Labs    06/16/19 1655  TSH 0.310*    Cardiac Enzymes No results for input(s): CKMB, TROPONINI, MYOGLOBIN in the last 168 hours.  Invalid input(s): CK ------------------------------------------------------------------------------------------------------------------    Component Value Date/Time   BNP 809.7 (H) 06/17/2019 0400    Micro Results Recent Results (from the past 240 hour(s))  Blood Culture (routine x 2)     Status: None (Preliminary result)   Collection Time: 05/21/2019  1:19 PM   Specimen: BLOOD  Result Value Ref Range Status   Specimen Description BLOOD SITE NOT SPECIFIED  Final   Special Requests   Final    BOTTLES DRAWN AEROBIC AND ANAEROBIC Blood Culture adequate volume   Culture   Final    NO GROWTH 4 DAYS Performed at [facility], [address]  [place], [place] [address]    Report Status PENDING  Incomplete  Blood Culture (routine x 2)     Status: None (Preliminary result)   Collection Time: 06/09/2019  1:41 PM   Specimen: BLOOD  Result Value Ref Range Status   Specimen Description BLOOD LEFT ANTECUBITAL  Final   Special Requests   Final    BOTTLES DRAWN AEROBIC AND ANAEROBIC Blood Culture adequate volume   Culture   Final    NO GROWTH 4 DAYS Performed at [facility], [place] [address] [place], [place] [address]    Report Status PENDING  Incomplete  Urine culture     Status: Abnormal   Collection Time: 06/16/2019  7:04 PM   Specimen: Urine, Random  Result Value Ref Range Status   Specimen Description URINE, RANDOM  Final   Special Requests   Final    NONE Performed at [facility], [place] [address], [place],  [address]    Culture MULTIPLE SPECIES PRESENT, SUGGEST RECOLLECTION (A)  Final   Report Status 06/14/2019 FINAL  Final    Radiology Reports US RENAL  Result Date: 06/15/2019 CLINICAL DATA:  78 year old male COVID-71. Acute renal injury on chronic renal disease. EXAM: RENAL / URINARY TRACT ULTRASOUND COMPLETE COMPARISON:  Renal ultrasound 08/31/2018. FINDINGS: Right Kidney: Renal measurements: 10.0 x 4.9 x 6.1 centimeters = volume: 158 mL (previously 133). Chronically echogenic right kidney with no hydronephrosis or renal mass. Left Kidney: Renal measurements: 9.0 x 5.6 x 3.8 centimeters = volume: 98 mL (previously 153). Stable left renal cortex which also appears mildly echogenic. Cortical thickness seems better preserved in the left kidney. No left hydronephrosis or renal mass. Bladder: Appears normal for degree of bladder distention. Other: None. IMPRESSION: No acute renal findings and negative urinary bladder. Chronic medical renal disease suspected. Electronically Signed   By: Herminio Heads.D.  On: 06/15/2019 08:50   CXR am  Result Date: 06/17/2019 CLINICAL DATA:  Shortness of breath. EXAM: PORTABLE CHEST 1  VIEW COMPARISON:  Chest radiograph 06/16/2019 FINDINGS: Stable cardiomediastinal silhouette. Overlying cardiac monitoring leads. Ill-defined opacity throughout both lungs appears progressed from prior examination. No sizable pleural effusion or evidence of pneumothorax. Nonspecific air distention of nondescript bowel loops within the partially imaged upper abdomen. IMPRESSION: Interval increase in ill-defined opacity throughout both lungs, likely reflecting multifocal pneumonia. Electronically Signed   By: Kellie Simmering DO   On: 06/17/2019 08:43   CXR am  Result Date: 06/16/2019 CLINICAL DATA:  Shortness of breath. EXAM: PORTABLE CHEST 1 VIEW COMPARISON:  05/30/2019 FINDINGS: 1410 hours. Interval progression of patchy ill-defined ground-glass airspace opacities bilaterally. Stable asymmetric elevation left hemidiaphragm. The cardiopericardial silhouette is within normal limits for size. The visualized bony structures of the thorax are intact. Telemetry leads overlie the chest. IMPRESSION: The hazy ill-defined bilateral patchy airspace opacity appears progressed in the mid lungs bilaterally. Electronically Signed   By: Misty Stanley M.D.   On: 06/16/2019 14:20   DG Chest Port 1 View  Result Date: 06/18/2019 CLINICAL DATA:  Cough.  Shortness of breath.  Fever. EXAM: PORTABLE CHEST 1 VIEW COMPARISON:  Chest x-ray 02/03/2012, 06/16/2005. FINDINGS: Mediastinum hilar structures normal. Heart size normal. Diffuse bilateral interstitial prominence. Pneumonitis could present this fashion. Bibasilar atelectasis with right base alveolar infiltrate. No prominent pleural effusion or pneumothorax. Stable elevation left hemidiaphragm. IMPRESSION: 1. Diffuse bilateral interstitial prominence. Pneumonitis could present this fashion. Bibasilar atelectasis with right base alveolar infiltrate. 2.  Chronic elevation left hemidiaphragm. Electronically Signed   By: Marcello Moores  Register   On: 05/21/2019 13:32   ECHOCARDIOGRAM  COMPLETE  Result Date: 06/16/2019   ECHOCARDIOGRAM REPORT   Patient Name:   Treavon Meints Date of Exam: 06/16/2019 Medical Rec #:  [id number]   Height:       73.0 in Accession #:    [id number]  Weight:       170.0 lb Date of Birth:  [birth year]   BSA:          2.01 m Patient Age:    57 years    BP:           144/79 mmHg Patient Gender: M           HR:           106 bpm. Exam Location:  Inpatient Procedure: 2D Echo STAT ECHO Indications:    Chest Pain 786.50 / R07.9  History:        Patient has no prior history of Echocardiogram examinations.                 Signs/Symptoms:Shortness of Breath; Risk Factors:Hypertension,                 Dyslipidemia and Diabetes. CVA. COVID-19.  Sonographer:    Clayton Lefort RDCS (AE) Referring Phys: [id number]intosh [facility]  Sonographer Comments: Echocardiogram performed at [place]. IMPRESSIONS  1. Left ventricular ejection fraction, by visual estimation, is >75%. The left ventricle has hyperdynamic function. There is no left ventricular hypertrophy.  2. Left ventricular diastolic parameters are normal for age. The "impaired relaxation" pattern of mitral inflow is probably due to low left atrial pressures ("underfilling") and increased cardiac output.  3. The left ventricle has no regional wall motion abnormalities.  4. Global right ventricle has hyperdynamic systolic function.The right ventricular size is normal. No increase in right ventricular wall  thickness.  5. Left atrial size was normal.  6. Right atrial size was normal.  7. The mitral valve is normal in structure. No evidence of mitral valve regurgitation.  8. The tricuspid valve is normal in structure.  9. The aortic valve is normal in structure. Aortic valve regurgitation is not visualized. Mild aortic valve sclerosis without stenosis. 10. The pulmonic valve was grossly normal. Pulmonic valve regurgitation is not visualized. 11. Normal pulmonary artery systolic pressure. 12. The inferior vena cava is collapsed,  consistent with low right atrial pressure. FINDINGS  Left Ventricle: Left ventricular ejection fraction, by visual estimation, is >75%. The left ventricle has hyperdynamic function. The left ventricle has no regional wall motion abnormalities. There is no left ventricular hypertrophy. Left ventricular diastolic parameters are consistent with Grade I diastolic dysfunction (impaired relaxation). Normal left atrial pressure. Right Ventricle: The right ventricular size is normal. No increase in right ventricular wall thickness. Global RV systolic function is has hyperdynamic systolic function. The tricuspid regurgitant velocity is 2.58 m/s, and with an assumed right atrial pressure of 0 mmHg, the estimated right ventricular systolic pressure is normal at 26.6 mmHg. Left Atrium: Left atrial size was normal in size. Right Atrium: Right atrial size was normal in size Pericardium: There is no evidence of pericardial effusion. Mitral Valve: The mitral valve is normal in structure. No evidence of mitral valve regurgitation. MV peak gradient, 11.4 mmHg. Tricuspid Valve: The tricuspid valve is normal in structure. Tricuspid valve regurgitation is not demonstrated. Aortic Valve: The aortic valve is normal in structure. Aortic valve regurgitation is not visualized. Mild aortic valve sclerosis is present, with no evidence of aortic valve stenosis. Aortic valve mean gradient measures 6.0 mmHg. Aortic valve peak gradient measures 10.9 mmHg. Aortic valve area, by VTI measures 2.66 cm. Pulmonic Valve: The pulmonic valve was grossly normal. Pulmonic valve regurgitation is not visualized. Pulmonic regurgitation is not visualized. Aorta: The aortic root is normal in size and structure. Venous: The inferior vena cava is collapsed, consistent with low right atrial pressure. IAS/Shunts: No atrial level shunt detected by color flow Doppler.  LEFT VENTRICLE PLAX 2D LVIDd:         3.51 cm  Diastology LVIDs:         2.08 cm  LV e' lateral:    12.00 cm/s LV PW:         1.18 cm  LV E/e' lateral: 9.6 LV IVS:        1.15 cm  LV e' medial:    8.16 cm/s LVOT diam:     2.00 cm  LV E/e' medial:  14.1 LV SV:         37 ml LV SV Index:   18.67 LVOT Area:     3.14 cm  RIGHT VENTRICLE             IVC RV Basal diam:  3.43 cm     IVC diam: 1.23 cm RV S prime:     16.80 cm/s TAPSE (M-mode): 3.1 cm LEFT ATRIUM             Index       RIGHT ATRIUM           Index LA diam:        3.20 cm 1.59 cm/m  RA Area:     18.90 cm LA Vol (A2C):   65.2 ml 32.47 ml/m RA Volume:   55.30 ml  27.54 ml/m LA Vol (A4C):   57.3 ml 28.54 ml/m  LA Biplane Vol: 64.0 ml 31.87 ml/m  AORTIC VALVE AV Area (Vmax):    2.59 cm AV Area (Vmean):   2.70 cm AV Area (VTI):     2.66 cm AV Vmax:           165.00 cm/s AV Vmean:          113.000 cm/s AV VTI:            0.290 m AV Peak Grad:      10.9 mmHg AV Mean Grad:      6.0 mmHg LVOT Vmax:         136.00 cm/s LVOT Vmean:        97.267 cm/s LVOT VTI:          0.246 m LVOT/AV VTI ratio: 0.85  AORTA Ao Root diam: 2.80 cm MITRAL VALVE                         TRICUSPID VALVE MV Area (PHT): 3.08 cm              TR Peak grad:   26.6 mmHg MV Peak grad:  11.4 mmHg             TR Vmax:        258.00 cm/s MV Mean grad:  5.0 mmHg MV Vmax:       1.69 m/s              SHUNTS MV Vmean:      105.0 cm/s            Systemic VTI:  0.25 m MV VTI:        0.32 m                Systemic Diam: 2.00 cm MV PHT:        71.34 msec MV Decel Time: 246 msec MV E velocity: 115.00 cm/s 103 cm/s MV A velocity: 170.00 cm/s 70.3 cm/s MV E/A ratio:  0.68        1.5  Mihai Croitoru MD Electronically signed by Sanda Klein MD Signature Date/Time: 06/16/2019/3:34:03 PM    Final    Leg [place] [facility]  Result Date: 06/13/2019  Lower Venous Study Indications: Elevated Ddimer.  Risk Factors: COVID 19 positive. Comparison Study: No prior studies. Performing Technologist: Oliver Hum RVT  Examination Guidelines: A complete evaluation includes B-mode imaging, spectral Doppler, color  Doppler, and power Doppler as needed of all accessible portions of each vessel. Bilateral testing is considered an integral part of a complete examination. Limited examinations for reoccurring indications may be performed as noted.  +---------+---------------+---------+-----------+----------+--------------+ RIGHT    CompressibilityPhasicitySpontaneityPropertiesThrombus Aging +---------+---------------+---------+-----------+----------+--------------+ CFV      Full           Yes      Yes                                 +---------+---------------+---------+-----------+----------+--------------+ SFJ      Full                                                        +---------+---------------+---------+-----------+----------+--------------+ FV Prox  Full                                                        +---------+---------------+---------+-----------+----------+--------------+  FV Mid   Full                                                        +---------+---------------+---------+-----------+----------+--------------+ FV DistalFull                                                        +---------+---------------+---------+-----------+----------+--------------+ PFV      Full                                                        +---------+---------------+---------+-----------+----------+--------------+ POP      Full           Yes      Yes                                 +---------+---------------+---------+-----------+----------+--------------+ PTV      None                                         Acute          +---------+---------------+---------+-----------+----------+--------------+ PERO     Full                                                        +---------+---------------+---------+-----------+----------+--------------+   +---------+---------------+---------+-----------+----------+--------------+ LEFT      CompressibilityPhasicitySpontaneityPropertiesThrombus Aging +---------+---------------+---------+-----------+----------+--------------+ CFV      Full           Yes      Yes                                 +---------+---------------+---------+-----------+----------+--------------+ SFJ      Full                                                        +---------+---------------+---------+-----------+----------+--------------+ FV Prox  Full                                                        +---------+---------------+---------+-----------+----------+--------------+ FV Mid   Full                                                        +---------+---------------+---------+-----------+----------+--------------+  FV DistalFull                                                        +---------+---------------+---------+-----------+----------+--------------+ PFV      Full                                                        +---------+---------------+---------+-----------+----------+--------------+ POP      Full           Yes      Yes                                 +---------+---------------+---------+-----------+----------+--------------+ PTV      Full                                                        +---------+---------------+---------+-----------+----------+--------------+ PERO     Full                                                        +---------+---------------+---------+-----------+----------+--------------+ SSV      None                                         Acute          +---------+---------------+---------+-----------+----------+--------------+     Summary: Right: Findings consistent with acute deep vein thrombosis involving the right peroneal veins. No cystic structure found in the popliteal fossa. Left: Findings consistent with acute superficial vein thrombosis involving the left small saphenous vein. No cystic structure  found in the popliteal fossa.  *See table(s) above for measurements and observations. Electronically signed by Monica Martinez MD on 06/13/2019 at 5:23:13 PM.    Final

## 2019-06-17 NOTE — Progress Notes (Addendum)
ANTICOAGULATION CONSULT NOTE   Pharmacy Consult for Heparin/coumadin Indication: BL acute  DVT  12/24   No Known Allergies  Patient Measurements: Height: 6\' 1"  (185.4 cm) Weight: 170 lb (77.1 kg) IBW/kg (Calculated) : 79.9 Heparin Dosing Weight: 77 kg  Vital Signs: Temp: 98.6 F (37 C) (12/28 0032) Temp Source: Oral (12/28 0400) BP: 132/64 (12/28 0559) Pulse Rate: 82 (12/28 0400)  Labs: Recent Labs    06/15/19 0158 06/15/19 2153 06/16/19 0432 06/16/19 1315 06/17/19 0400  HGB 11.4*  --  12.0* 13.1 13.2  HCT 36.7*  --  38.5* 42.6 41.7  PLT 216  --  261 372 322  LABPROT  --   --  22.4*  --  22.8*  INR  --   --  2.0*  --  2.0*  HEPARINUNFRC 1.01* 0.44 0.37  --  0.20*  CREATININE 4.47*  --  4.57*  --  5.26*    Estimated Creatinine Clearance: 12.6 mL/min (A) (by C-G formula based on SCr of 5.26 mg/dL (H)).   Assessment: 78 y/o M admited with respiratory failure due to COVID-PNA on heparin 7500 unit [place] q 8 h for VTE ppx. D-dimer is extremely elevated, 12/24 LE US shows acute DVT involving the R peroneal veins and acute superficial vein thrombosis in the L small saphenous vein.  Heparin level has dropped to 0.20 but INR remains 2.0  Hg 13.2, PTLC 322  No bleeding noted  Goal of Therapy:  Heparin level 0.3-0.7 units/ml  INR 2-3 Monitor platelets by anticoagulation protocol: Yes   Plan:  Heparin to 650 units / hr Coumadin 2.5mg  po tonight F/u daily INR, heparin level, and CBC Will need coumadin education prior to d/c  Thank you Anette Guarneri, PharmD  06/17/2019

## 2019-06-17 NOTE — Progress Notes (Addendum)
Clinical/Bedside Swallow Evaluation Patient Details  Name: Steve Martinez MRN: [id number] Date of Birth: April 19, 1941  Today's Date: 06/17/2019 Time: SLP Start Time (ACUTE ONLY): 37 SLP Stop Time (ACUTE ONLY): 1440 SLP Time Calculation (min) (ACUTE ONLY): 14 min  Past Medical History:  Past Medical History:  Diagnosis Date  . Back pain    occasionally d/t pulled muscle  . Cataracts, bilateral   . Diabetes mellitus    lantus bid  . GERD (gastroesophageal reflux disease)   . Hemorrhoids   . History of bronchitis    last year  . Hyperlipidemia    takes Zocor daily  . Hypertension    takes Diltiazem,Maxzide,and Quinapril daily  . Myocardial infarction ([place]) 17-76yrs ago  . 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 CARDIAC CATHETERIZATION  17-63yrs ago  . CATARACT EXTRACTION W/PHACO  02/08/2012   Procedure: CATARACT EXTRACTION PHACO AND INTRAOCULAR LENS PLACEMENT (IOC);  Surgeon: Adonis Brook, MD;  Location: [place];  Service: Ophthalmology;  Laterality: Right;  . COLONOSCOPY    . REFRACTIVE SURGERY    . SHOULDER SURGERY Left    HPI:  78 y.o male w/ hx of CVA, MI, HTN, HLD, bronchitis, GERD, DM, B cataracts, back pain. Pt also w/ hx of shoulder surgery, refractive surgery abd cardiac cath. pt presented to hospital w/ c/o progressiverly worsening SOB over a week and was dx with acute hypoxic respiratory failure due to COVID 19 PNA. Underwent bedside swallow eval with ST and found to have no s/s aspiration, O2 sats stable, no supplemental O2 and RR stable; reg/thin liquids recommended and ST discharged. On 12/27 noted to have increased O2 needs after working with OT. MD note states "clinically it appears pt has likely aspirated 12/27" and swallow eval re-ordered. CXR 12/28 Interval increase in ill-defined opacity throughout both lungs, likely reflecting multifocal pneumonia.   Assessment / Plan /  Recommendation Clinical Impression  Bedside swallow results similar to initial assessment on 12/26 although he now requires HFNC, 7 L and O2 sats around 89-93% throughout evaluation. Slight increased work of breathing with po presentations although respiratory/swallow coordination appeared synchonized with adequate exhalation after swallows. Oral phase with peaches was timely and efficient. Agree with continuing soft diet for energy conservation. Thin liquids, straws allowed and may have easier time if pills were whole in puree. Unable to eliminate silent aspiration from bedside but will continue to intervene and if MBS is warranted, can perform.    SLP Visit Diagnosis: Dysphagia, unspecified (R13.10)    Aspiration Risk  Mild aspiration risk    Diet Recommendation Thin liquid;Other (Comment)(soft)   Liquid Administration via: Cup;Straw Medication Administration: Whole meds with puree Supervision: Patient able to self feed Compensations: Slow rate;Small sips/bites Postural Changes: Seated upright at 90 degrees    Other  Recommendations Oral Care Recommendations: Oral care BID   Follow up Recommendations None      Frequency and Duration min 1 x/week  2 weeks       Prognosis Prognosis for Safe Diet Advancement: Good      Swallow Study   General HPI: 78 y.o male w/ hx of CVA, MI, HTN, HLD, bronchitis, GERD, DM, B cataracts, back pain. Pt also w/ hx of shoulder surgery, refractive surgery abd cardiac cath. pt presented to hospital w/ c/o progressiverly worsening SOB over a week and was dx with acute hypoxic respiratory failure due to COVID 19 PNA. Underwent bedside  swallow eval with ST and doung to have no s/s aspiration, O2 sats stable, no supplemental O2 and RR stable. On 12/27 noted to have increased O2 needs after working with OT. MD note states "clinically it appears pt has likely aspirated 12/27" and swallow eval re-ordered. CXR 12/28 Interval increase in ill-defined opacity  throughout both lungs, likely reflecting multifocal pneumonia. Type of Study: Bedside Swallow Evaluation Previous Swallow Assessment: (see HPI) Diet Prior to this Study: Thin liquids;Other (Comment)(soft) Temperature Spikes Noted: No Respiratory Status: (HFNC) History of Recent Intubation: No Behavior/Cognition: Alert;Cooperative;Pleasant mood Oral Cavity Assessment: Within Functional Limits Oral Care Completed by SLP: No Oral Cavity - Dentition: Adequate natural dentition Vision: Functional for self-feeding Self-Feeding Abilities: Able to feed self Patient Positioning: Upright in bed Baseline Vocal Quality: Normal Volitional Cough: Strong Volitional Swallow: Able to elicit    Oral/Motor/Sensory Function Overall Oral Motor/Sensory Function: Within functional limits   Ice Chips Ice chips: Not tested   Thin Liquid Thin Liquid: Within functional limits Presentation: Cup;Self Fed;Spoon    Nectar Thick Nectar Thick Liquid: Not tested   Honey Thick Honey Thick Liquid: Not tested   Puree Puree: Not tested   Solid     Solid: Within functional limits Presentation: Self Fed      Houston Siren 06/17/2019,2:51 PM  [name] [email or web address].Ed Risk analyst [phone] Office [phone]

## 2019-06-17 NOTE — Progress Notes (Signed)
Physical Therapy Treatment Patient Details Name: Steve Martinez MRN: [id number] DOB: [birth year] Today's Date: 06/17/2019    History of Present Illness 78 y.o male w/ hx of CVA, MI, HTN, HLD, bronchitis, GERD, DM, B cataracts, back pain. Pt also w/ hx of shoulder surgery, refractive surgery abd cardiac cath. pt presented to hospital w/ c/o progressiverly worsening SOB over a week and was dx with acute hypoxic respiratory failure ude to COVID 19 PNA.    PT Comments    Patient had been OOB to chair twice today per pt and RN. He agreed to try to take a walk. Denied dizziness throughout session, however after walking to window and standing ~15 seconds he began falling forward and could not right himself or coordinate his steps. Seated immediately and pt with very low voice volume but stating he felt he couldn't breath. (Later, once supine in bed and recovered he again denied dizziness but stated he suddenly got really weak and legs would not hold him). Was unable to get his BP during event, but ?orthostasis?. Strength seemed symmetrical, no facial droop noted. Patient's RN made aware (another RN in to check pt as his primary nurse was with another patient).     Follow Up Recommendations  SNF;Supervision/Assistance - 24 hour     Equipment Recommendations  Rolling walker with 5" wheels    Recommendations for Other Services       Precautions / Restrictions Precautions Precautions: Fall Restrictions Weight Bearing Restrictions: No    Mobility  Bed Mobility Overal bed mobility: Needs Assistance Bed Mobility: Supine to Sit;Sit to Supine     Supine to sit: Min guard;HOB elevated(with rail) Sit to supine: Min assist;+2 for physical assistance   General bed mobility comments: for safety and line management, no physical assist required  Transfers Overall transfer level: Needs assistance Equipment used: Rolling walker (2 wheeled) Transfers: Sit to/from [facility] Sit to  Stand: Min assist;Mod assist;+2 physical assistance;+2 safety/equipment Stand pivot transfers: Mod assist;+2 physical assistance       General transfer comment: initial stand from bed with min steadying assist; pt denied dizziness; at window began leaning forward and could not coordinate his steps or talk loudly enough to be heard; pulled side table up and seated him on table; pt began stating he couldn't breath although sats 90-93%. Able to get recliner next to side table and transferred stand-pivot to recliner and after nursing arrived, stand-pivot to bed with +2 assist  Ambulation/Gait Ambulation/Gait assistance: Min assist;Max assist Gait Distance (Feet): 5 Feet Assistive device: Rolling walker (2 wheeled) Gait Pattern/deviations: Narrow base of support;Decreased step length - right;Decreased step length - left;Decreased stride length;Shuffle     General Gait Details: ambulated on 8L (was on 7L at rest sats 89-92%. Initial walking to window was min guard assist; after standing ~15 seconds to look at window, pt began trying to turn RW and with incr lean forward and not coordinating his feet/steps. ?orthostasis   Stairs             Wheelchair Mobility    Modified Rankin (Stroke Patients Only)       Balance Overall balance assessment: Needs assistance Sitting-balance support: Feet supported Sitting balance-Leahy Scale: Fair     Standing balance support: Bilateral upper extremity supported Standing balance-Leahy Scale: Zero Standing balance comment: reliant on UE support                            Cognition  Arousal/Alertness: Awake/alert Behavior During Therapy: Flat affect Overall Cognitive Status: No family/caregiver present to determine baseline cognitive functioning                                 General Comments: seems to be slow processing especially when completing breathing exercises needing continued cues and redicrection to complete  exercises      Exercises Other Exercises Other Exercises: incentive spirometer, pt only able to pull approx 735ml x 2 reps with max cues    General Comments        Pertinent Vitals/Pain Pain Assessment: No/denies pain Faces Pain Scale: Hurts a little bit Pain Intervention(s): Monitored during session    Home Living                      Prior Function            PT Goals (current goals can now be found in the care plan section) Acute Rehab PT Goals Patient Stated Goal: to get better and go home  Time For Goal Achievement: 06/27/19 Potential to Achieve Goals: Good Progress towards PT goals: Not progressing toward goals - comment(?orthostasis)    Frequency    Min 3X/week(? pt will agree to SNF)      PT Plan Discharge plan needs to be updated    Co-evaluation              AM-PAC PT "6 Clicks" Mobility   Outcome Measure  Help needed turning from your back to your side while in a flat bed without using bedrails?: A Little Help needed moving from lying on your back to sitting on the side of a flat bed without using bedrails?: A Little Help needed moving to and from a bed to a chair (including a wheelchair)?: A Lot Help needed standing up from a chair using your arms (e.g., wheelchair or bedside chair)?: A Lot Help needed to walk in hospital room?: A Lot Help needed climbing 3-5 steps with a railing? : Total 6 Click Score: 13    End of Session Equipment Utilized During Treatment: Oxygen Activity Tolerance: Treatment limited secondary to medical complications (Comment) Patient left: with call bell/phone within reach;in bed Nurse Communication: Mobility status;Other (comment)(?pre-syncope) PT Visit Diagnosis: Other abnormalities of gait and mobility (R26.89);Muscle weakness (generalized) (M62.81)     Time: [id number] PT Time Calculation (min) (ACUTE ONLY): 38 min  Charges:  $Gait Training: 8-22 mins $Therapeutic Activity: 8-22 mins                       Arby Barrette, PT Pager [phone]     Rexanne Mano 06/17/2019, 4:24 PM

## 2019-06-17 NOTE — Consult Note (Signed)
Renal Service Consult Note [facility]  Kashus Bethell 06/17/2019 Sol Blazing Requesting Physician:  Dr Candiss Norse  Reason for Consult:  Acute on CKD, +COVID pna HPI: The patient is a 78 y.o. year-old with hx of CVA, HTN, MI, HL, DM2 and CKD f/b Dr Hollie Salk at [facility] presented on 12/23 with SOB and cough, dx'd w/ COVID PNA and admitted to [facility].  Creat was 3.6 on admission , improved to 3.74 but now is rising up to 4.5 yest and 5.29 today.  Has been getting IVF"s and is 12L / 5L out = 7L + since admission.  No recent wts.  Asked to see for renal failure.   Patient states he has heard of HD but doesn't know a lot about it.  He lives alone, drives , cooks and takes care of himself.   ROS  denies CP  no joint pain   no HA  no blurry vision  no rash  no diarrhea  no nausea/ vomiting  no dysuria  no difficulty voiding  no change in urine color    Past Medical History  Past Medical History:  Diagnosis Date  . Back pain    occasionally d/t pulled muscle  . Cataracts, bilateral   . Diabetes mellitus    lantus bid  . GERD (gastroesophageal reflux disease)   . Hemorrhoids   . History of bronchitis    last year  . Hyperlipidemia    takes Zocor daily  . Hypertension    takes Diltiazem,Maxzide,and Quinapril daily  . Myocardial infarction ([place]) 17-66yrs ago  . 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 CARDIAC CATHETERIZATION  17-32yrs ago  . CATARACT EXTRACTION W/PHACO  02/08/2012   Procedure: CATARACT EXTRACTION PHACO AND INTRAOCULAR LENS PLACEMENT (IOC);  Surgeon: Adonis Brook, MD;  Location: [place];  Service: Ophthalmology;  Laterality: Right;  . COLONOSCOPY    . REFRACTIVE SURGERY    . SHOULDER SURGERY Left    Family History  Family History  Problem Relation Age of Onset  . Colon cancer Neg Hx   . Esophageal cancer Neg Hx   . Rectal cancer Neg Hx   . Stomach cancer Neg Hx    Social  History  reports that he has never smoked. He has never used smokeless tobacco. He reports that he does not drink alcohol or use drugs. Allergies No Known Allergies Home medications Prior to Admission medications   Medication Sig Start Date End Date Taking? Authorizing Provider  acetaminophen (TYLENOL) 500 MG tablet Take 1,000 mg by mouth every 6 (six) hours as needed for mild pain or fever.   Yes [provider]  amLODipine (NORVASC) 10 MG tablet Take 10 mg by mouth daily.   Yes [provider]  aspirin EC 325 MG tablet Take 325 mg by mouth daily.   Yes [provider]  carvedilol (COREG) 6.25 MG tablet Take 6.25 mg by mouth 2 (two) times daily with a meal.   Yes [provider]  furosemide (LASIX) 40 MG tablet Take 40 mg by mouth daily.   Yes [provider]  NOVOLOG MIX 70/30 FLEXPEN (70-30) 100 UNIT/ML FlexPen Inject 20-30 Units into the skin 2 (two) times daily with a meal.  04/23/19  Yes [provider]  rosuvastatin (CRESTOR) 40 MG tablet Take 40 mg by mouth daily.   Yes [provider]  ACCU-CHEK AVIVA PLUS test strip  03/18/19   [provider]  BD PEN NEEDLE NANO 2ND GEN 32G X 4 MM MISC  04/09/19   [provider]   Liver Function Tests Recent Labs  Lab 06/15/19 0158 06/16/19 0432 06/17/19 0400  AST 59* 53* 54*  ALT 34 33 31  ALKPHOS 89 102 123  BILITOT 0.5 0.6 0.8  PROT 6.3* 6.9 7.2  ALBUMIN 2.0* 2.0* 2.2*   No results for input(s): LIPASE, AMYLASE in the last 168 hours. CBC Recent Labs  Lab 06/15/19 0158 06/16/19 0432 06/16/19 1315 06/17/19 0400  WBC 14.1* 15.6* 21.4* 19.1*  NEUTROABS 12.5* 14.2*  --  17.4*  HGB 11.4* 12.0* 13.1 13.2  HCT 36.7* 38.5* 42.6 41.7  MCV 83.0 83.0 83.9 81.3  PLT 216 261 372 [id number]   Basic Metabolic Panel Recent Labs  Lab 06/15/2019 1324 05/31/2019 1904 06/13/19 0509 06/14/19 0005 06/15/19 0158 06/16/19 0432 06/17/19 0400  NA 142 144 146* 140 142 140 145   K 3.4* 3.5 4.0 3.3* 3.9 3.9 4.2  CL 104 108 112* 109 108 110 110  CO2 23 22 23  20* 19* 20* 20*  GLUCOSE 554* 440* 203* 210* 120* 168* 210*  BUN 51* 48* 52* 59* 68* 73* 88*  CREATININE 4.67* 4.24* 3.68* 3.74* 4.47* 4.57* 5.26*  CALCIUM 8.0* 7.8* 8.0* 7.7* 7.8* 8.0* 8.4*  PHOS  --   --  2.8  --   --   --   --    Iron/TIBC/Ferritin/ %Sat    Component Value Date/Time   FERRITIN 639 (H) 06/13/2019 0509    Vitals:   06/17/19 0559 06/17/19 0730 06/17/19 0812 06/17/19 1208  BP: 132/64 131/61 139/60 (!) 126/59  Pulse:   85   Resp:  (!) 22  (!) 23  Temp:  98.2 F (36.8 C)  (!) 97.4 F (36.3 C)  TempSrc:      SpO2:   92%   Weight:      Height:        Exam Gen alert older adult AAM, no distress, pleasant, mostly oriented No rash, cyanosis or gangrene Sclera anicteric, throat clear  No jvd or bruits Chest dry rales bilat bases 1/3 up RRR no MRG Abd soft ntnd no mass or ascites +bs GU normal male MS no joint effusions or deformity Ext no LE edema, no wounds or ulcers Neuro is alert, Ox2, nonfocal, mild gen'd weakness    Home meds:  - amlodipine 10/ carvedilol 6.25 bid/ furosemide 40 qd  - rosuvastatin 40/ aspirin 325  - novolog 70/30 20-30 bid     UA negative   Renal US - 9cm/ 10 cm kidneys w/o hydro, ^'d echo    Na 146 K 4.2 CO2 20 BUN 88/ creat 5.2   I/O cumulative 12L in and 5 L out  Assessment/ Plan: 1. AoCKD - get OP records, pt is f/b [facility].  B/Cr rising and some confusion , may have early uremia symptoms.  Pt is functional at home, lives alone, and wishes to proceed w/ dialysis if needed.  UA and renal US negative, euvolemic on exam.   2. DM2 3. HTN 4. COVID+ PNA      Kelly Splinter  MD 06/17/2019, 1:04 PM

## 2019-06-17 NOTE — Plan of Care (Signed)
Pt A&Ox4. VSS, SpO2 >88% on 5L HFNC. No c/o pain throughout shift. Utilizing urinal to void  OOBTC for meals & majority of shift, tolerated well  Worked w/ PT, see notes for details. Reassessed pt after PT placed in bed. Neuros intact but more tired thabn prior.   Wife Steve Martinez [phone]) called & updated on plan of care. All questions answered  Steve Martinez A Steve Martinez,e    Problem: Education: Goal: Knowledge of risk factors and measures for prevention of condition will improve Outcome: Progressing   Problem: Coping: Goal: Psychosocial and spiritual needs will be supported Outcome: Progressing   Problem: Respiratory: Goal: Will maintain a patent airway Outcome: Progressing Goal: Complications related to the disease process, condition or treatment will be avoided or minimized Outcome: Progressing

## 2019-06-17 NOTE — Progress Notes (Addendum)
RN sent Dr. Vanita Ingles a page concerned about the amount of insulin patient received and BG levels throughout the day. Patient found hypoglycemic at shift change. Was given juice and Dextrose. RN will continue to monitor patients BG closely. Patient has a full dinner tray on bedside table.  @ 08:16 BG 263  08:18    25 units  08:36     novolog 4 units @ 12:07 BG 169  13:12       novolog 4 Units @ 15:42 BG 100  17:25      novolog 4 units  17:26      25 units @ 19:37 BG 57    Juice Given BG did not increase  1 amp of D50% given BG increased to 140's  but already is trending down  BG @ 10:31 = 98 RN educated patient on the need to eat and patient is refusing at this time  Patient ate 1 pudding at 10:48pm. RN educated patient on the need to eat more food to help maintain BG. Patient refused at this time.

## 2019-06-18 ENCOUNTER — Inpatient Hospital Stay (HOSPITAL_COMMUNITY): Payer: Medicare HMO

## 2019-06-18 HISTORY — PX: IR FLUORO GUIDE CV LINE RIGHT: IMG2283

## 2019-06-18 HISTORY — PX: IR US GUIDE VASC ACCESS RIGHT: IMG2390

## 2019-06-18 LAB — CBC WITH DIFFERENTIAL/PLATELET
Abs Immature Granulocytes: 0.49 10*3/uL — ABNORMAL HIGH (ref 0.00–0.07)
Basophils Absolute: 0.1 10*3/uL (ref 0.0–0.1)
Basophils Relative: 0 %
Eosinophils Absolute: 0 10*3/uL (ref 0.0–0.5)
Eosinophils Relative: 0 %
HCT: 40.4 % (ref 39.0–52.0)
Hemoglobin: 13 g/dL (ref 13.0–17.0)
Immature Granulocytes: 2 %
Lymphocytes Relative: 3 %
Lymphs Abs: 0.6 10*3/uL — ABNORMAL LOW (ref 0.7–4.0)
MCH: 25.8 pg — ABNORMAL LOW (ref 26.0–34.0)
MCHC: 32.2 g/dL (ref 30.0–36.0)
MCV: 80.3 fL (ref 80.0–100.0)
Monocytes Absolute: 0.8 10*3/uL (ref 0.1–1.0)
Monocytes Relative: 4 %
Neutro Abs: 18.6 10*3/uL — ABNORMAL HIGH (ref 1.7–7.7)
Neutrophils Relative %: 91 %
Platelets: 365 10*3/uL (ref 150–400)
RBC: 5.03 MIL/uL (ref 4.22–5.81)
RDW: 15.9 % — ABNORMAL HIGH (ref 11.5–15.5)
WBC: 20.5 10*3/uL — ABNORMAL HIGH (ref 4.0–10.5)
nRBC: 0.1 % (ref 0.0–0.2)

## 2019-06-18 LAB — T4, FREE: Free T4: 1.07 ng/dL (ref 0.61–1.12)

## 2019-06-18 LAB — BASIC METABOLIC PANEL
Anion gap: 18 — ABNORMAL HIGH (ref 5–15)
BUN: 106 mg/dL — ABNORMAL HIGH (ref 8–23)
CO2: 17 mmol/L — ABNORMAL LOW (ref 22–32)
Calcium: 7.3 mg/dL — ABNORMAL LOW (ref 8.9–10.3)
Chloride: 102 mmol/L (ref 98–111)
Creatinine, Ser: 6.67 mg/dL — ABNORMAL HIGH (ref 0.61–1.24)
GFR calc Af Amer: 8 mL/min — ABNORMAL LOW (ref >60)
GFR calc non Af Amer: 7 mL/min — ABNORMAL LOW (ref >60)
Glucose, Bld: 124 mg/dL — ABNORMAL HIGH (ref 70–99)
Potassium: 3.7 mmol/L (ref 3.5–5.1)
Sodium: 137 mmol/L (ref 135–145)

## 2019-06-18 LAB — GLUCOSE, CAPILLARY
Glucose-Capillary: 119 mg/dL — ABNORMAL HIGH (ref 70–99)
Glucose-Capillary: 130 mg/dL — ABNORMAL HIGH (ref 70–99)
Glucose-Capillary: 116 mg/dL — ABNORMAL HIGH (ref 70–99)
Glucose-Capillary: 193 mg/dL — ABNORMAL HIGH (ref 70–99)
Glucose-Capillary: 192 mg/dL — ABNORMAL HIGH (ref 70–99)
Glucose-Capillary: 189 mg/dL — ABNORMAL HIGH (ref 70–99)
Glucose-Capillary: 211 mg/dL — ABNORMAL HIGH (ref 70–99)
Glucose-Capillary: 146 mg/dL — ABNORMAL HIGH (ref 70–99)

## 2019-06-18 LAB — LACTIC ACID, PLASMA
Lactic Acid, Venous: 2.2 mmol/L (ref 0.5–1.9)
Lactic Acid, Venous: 1.5 mmol/L (ref 0.5–1.9)

## 2019-06-18 LAB — MAGNESIUM: Magnesium: 2.6 mg/dL — ABNORMAL HIGH (ref 1.7–2.4)

## 2019-06-18 LAB — PROCALCITONIN: Procalcitonin: 2.19 ng/mL

## 2019-06-18 LAB — HEPARIN LEVEL (UNFRACTIONATED): Heparin Unfractionated: 0.44 IU/mL (ref 0.30–0.70)

## 2019-06-18 LAB — D-DIMER, QUANTITATIVE: D-Dimer, Quant: 7.95 ug/mL-FEU — ABNORMAL HIGH (ref 0.00–0.50)

## 2019-06-18 LAB — PROTIME-INR
INR: 2.5 — ABNORMAL HIGH (ref 0.8–1.2)
Prothrombin Time: 27.1 seconds — ABNORMAL HIGH (ref 11.4–15.2)

## 2019-06-18 LAB — BRAIN NATRIURETIC PEPTIDE: B Natriuretic Peptide: 512.3 pg/mL — ABNORMAL HIGH (ref 0.0–100.0)

## 2019-06-18 LAB — C-REACTIVE PROTEIN: CRP: 16.5 mg/dL — ABNORMAL HIGH (ref <1.0)

## 2019-06-18 MED ORDER — INSULIN ASPART PROT & ASPART (70-30 MIX) 100 UNIT/ML ~~LOC~~ SUSP
20.0000 [IU] | Freq: Two times a day (BID) | SUBCUTANEOUS | Status: DC
  Administered 2019-06-18: 20 [IU] via SUBCUTANEOUS
  Filled 2019-06-18 (×2): qty 10

## 2019-06-18 MED ORDER — LACTATED RINGERS IV SOLN: INTRAVENOUS | Status: AC

## 2019-06-18 MED ORDER — WARFARIN SODIUM 1 MG PO TABS
1.0000 mg | ORAL_TABLET | Freq: Once | ORAL | Status: DC
  Filled 2019-06-18 (×2): qty 1

## 2019-06-18 MED ORDER — CHLORHEXIDINE GLUCONATE CLOTH 2 % EX PADS
6.0000 | MEDICATED_PAD | Freq: Every day | CUTANEOUS | Status: DC
  Administered 2019-06-18 – 2019-06-19 (×2): 6 via TOPICAL

## 2019-06-18 MED ORDER — INSULIN ASPART 100 UNIT/ML ~~LOC~~ SOLN
0.0000 [IU] | Freq: Three times a day (TID) | SUBCUTANEOUS | Status: DC
  Administered 2019-06-18: 2 [IU] via SUBCUTANEOUS
  Administered 2019-06-18: 3 [IU] via SUBCUTANEOUS

## 2019-06-18 MED ORDER — WARFARIN - PHARMACIST DOSING INPATIENT: Freq: Every day | Status: DC

## 2019-06-18 MED ORDER — HEPARIN (PORCINE) 25000 UT/250ML-% IV SOLN
650.0000 [IU]/h | INTRAVENOUS | Status: DC
  Administered 2019-06-19: 05:00:00 650 [IU]/h via INTRAVENOUS
  Filled 2019-06-18: qty 250

## 2019-06-18 MED ORDER — DEXAMETHASONE SODIUM PHOSPHATE 10 MG/ML IJ SOLN
8.0000 mg | INTRAMUSCULAR | Status: DC
  Administered 2019-06-18: 13:00:00 8 mg via INTRAVENOUS
  Filled 2019-06-18: qty 0.8
  Filled 2019-06-18: qty 1

## 2019-06-18 MED ORDER — INSULIN ASPART 100 UNIT/ML ~~LOC~~ SOLN
0.0000 [IU] | SUBCUTANEOUS | Status: DC
  Administered 2019-06-18: 1 [IU] via SUBCUTANEOUS
  Administered 2019-06-19: 2 [IU] via SUBCUTANEOUS
  Administered 2019-06-19: 3 [IU] via SUBCUTANEOUS
  Administered 2019-06-19 – 2019-06-20 (×8): 2 [IU] via SUBCUTANEOUS
  Administered 2019-06-20 – 2019-06-21 (×2): 3 [IU] via SUBCUTANEOUS
  Administered 2019-06-21 (×2): 2 [IU] via SUBCUTANEOUS
  Administered 2019-06-21: 5 [IU] via SUBCUTANEOUS
  Administered 2019-06-21 – 2019-06-22 (×2): 3 [IU] via SUBCUTANEOUS
  Administered 2019-06-22 (×5): 5 [IU] via SUBCUTANEOUS
  Administered 2019-06-23: 2 [IU] via SUBCUTANEOUS
  Administered 2019-06-23: 3 [IU] via SUBCUTANEOUS
  Administered 2019-06-23 (×2): 2 [IU] via SUBCUTANEOUS
  Administered 2019-06-23: 5 [IU] via SUBCUTANEOUS
  Administered 2019-06-23 – 2019-06-24 (×3): 3 [IU] via SUBCUTANEOUS
  Administered 2019-06-24: 2 [IU] via SUBCUTANEOUS

## 2019-06-18 MED ORDER — METOPROLOL TARTRATE 25 MG PO TABS: 25.0000 mg | ORAL_TABLET | Freq: Two times a day (BID) | ORAL | Status: DC

## 2019-06-18 MED ORDER — LIDOCAINE HCL 1 % IJ SOLN
INTRAMUSCULAR | Status: DC | PRN
  Administered 2019-06-18: 5 mL

## 2019-06-18 MED ORDER — HEPARIN SODIUM (PORCINE) 1000 UNIT/ML IJ SOLN
INTRAMUSCULAR | Status: AC
  Administered 2019-06-18: 3.2 mL
  Filled 2019-06-18: qty 1

## 2019-06-18 MED ORDER — LIDOCAINE HCL 1 % IJ SOLN
INTRAMUSCULAR | Status: AC
  Filled 2019-06-18: qty 20

## 2019-06-18 MED ORDER — FUROSEMIDE 10 MG/ML IJ SOLN
60.0000 mg | Freq: Once | INTRAMUSCULAR | Status: AC
  Administered 2019-06-18: 60 mg via INTRAVENOUS
  Filled 2019-06-18: qty 6

## 2019-06-18 MED ORDER — WARFARIN SODIUM 1 MG PO TABS
1.0000 mg | ORAL_TABLET | Freq: Once | ORAL | Status: AC
  Administered 2019-06-18: 1 mg via ORAL
  Filled 2019-06-18: qty 1

## 2019-06-18 MED ORDER — SODIUM CHLORIDE 0.9 % IV SOLN
3.0000 g | INTRAVENOUS | Status: DC
  Administered 2019-06-19: 05:00:00 3 g via INTRAVENOUS
  Filled 2019-06-18: qty 3

## 2019-06-18 NOTE — Progress Notes (Addendum)
PROGRESS NOTE                                                                                                                                                                                                             Patient Demographics:    Steve Martinez, is a 78 y.o. male, DOB - 29-Apr-1941, OMV:[id number]  Outpatient Primary MD for the patient is Gaynelle Arabian, MD    LOS - 6  Admit date - 05/23/2019    Chief Complaint  Patient presents with  . Shortness of Breath  . COVID       Brief Narrative  Steve Martinez is a 78 y.o. male with medical history significant of hypertension, hyperlipidemia, diabetes mellitus type 2, GERD, CVA with left-sided weakness.  He presents with complaints of progressively worsening shortness of breath over the last week, was diagnosed with acute hypoxic respiratory failure due to COVID-19 pneumonia and admitted to [facility]   Subjective:   Patient in bed, appears comfortable, denies any headache, no fever, no chest pain or pressure, +ve cough and mild shortness of breath , no abdominal pain. No focal weakness.   Assessment  & Plan :     1. Acute Hypoxic Resp. Failure due to Acute Covid 19 Viral Pneumonitis during the ongoing 2020 Covid 19 Pandemic - he has moderate to severe disease, currently on 7-10 L nasal cannula oxygen, has been started on IV steroids and remdesivir and seemed to be doing okay from this standpoint, unfortunately aspirated and had worsening AKI ( fluid OD) hence respiratory status getting worse again, continue monitoring closely.    Unfortunately clinically it appears that patient has likely aspirated on 06/16/2019, advised not to have meals in bed, staff also educated, speech therapy reconsulted, soft diet, Unasyn and monitor.  Encouraged the patient to sit up in chair in the daytime use I-S and flutter valve for pulmonary toiletry and then prone in bed when at night.     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  For now we will give him another 250 cc bolus, have also talked to ICU physician Dr. Janett Billow  Ruthann Cancer to observe him in ICU overnight.  Family had already been updated by me that patient's prognosis is poor, for short-term they want to do aggressive measures.    Recent Labs  Lab 06/09/2019 1324 06/13/19 0509 06/14/19 0005 06/15/19 0158 06/16/19 0432 06/16/19 1655 06/17/19 0400 06/18/19 0520  CRP 17.2* 15.5* 10.2* 7.6* 10.6*  --  15.0* 16.5*  DDIMER >20.00* >20.00* >20.00* >20.00* 11.55*  --  9.62* 7.95*  FERRITIN 830* 639*  --   --   --   --   --   --   BNP  --   --  330.6* 505.8* 659.2*  --  809.7* 512.3*  PROCALCITON 1.35 1.16 1.23 1.35  --  1.23 1.66 2.19    Hepatic Function Latest Ref Rng & Units 06/17/2019 06/16/2019 06/15/2019  Total Protein 6.5 - 8.1 g/dL 7.2 6.9 6.3(L)  Albumin 3.5 - 5.0 g/dL 2.2(L) 2.0(L) 2.0(L)  AST 15 - 41 U/L 54(H) 53(H) 59(H)  ALT 0 - 44 U/L 31 33 34  Alk Phosphatase 38 - 126 U/L 123 102 89  Total Bilirubin 0.3 - 1.2 mg/dL 0.8 0.6 0.5     2.  Aspiration pneumonia developed 06/16/2019.  Speech consult, soft diet, keep head of the bed elevated, all meals in chair, Unasyn and monitor. Cultures pending. Trend Procal, could get Septic.  3.  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  Per renal office his baseline creatinine on file is is between 4 and 4.8, he follows with Dr. Hollie Salk so certainly has underlying renal issues. Continues to get worse, transfer to [facility] for HD cath, HD soon per Renal, Renal following now. Lasix IV for now for fluid removal.  4.  Dyslipidemia.  On Crestor.  5.  Hypertension.  BP now softer, hold BP Meds, gentle  IVF.  6.  Hypokalemia.  Replaced.  7.  Acute Bilateral Leg DVT - Hep gtt , pharmacy for Coumadin or NOAC if possible with his renal function.  Lab Results  Component Value Date   INR 2.5 (H) 06/18/2019   INR 2.0 (H) 06/17/2019   INR 2.0 (H) 06/16/2019   8. Prolonged QTC, electrolytes stable, not on any prolonging medications. Monitor on beta-blocker.    9.  Previous history of stroke.  Close to his baseline.  Supportive care.  Continue statin for secondary prevention.  10. DM type II.  On sliding scale.  Lab Results  Component Value Date   HGBA1C 9.6 (H) 06/13/2019    CBG (last 3)  Recent Labs    06/18/19 1132 06/18/19 1245 06/18/19 1721  GLUCAP 192* 189* 211*     Condition - guarded  Family Communication  :  Son 06/13/19, 06/16/2019, 06/18/2019.  Explained guarded prognosis.  Understands.  Code Status :  Full  Diet :   Diet Order            DIET DYS 3 Room service appropriate? Yes; Fluid consistency: Thin  Diet effective now               Disposition Plan  :  [facility]  Consults  :  Renal, IR  Procedures  :    Vas [place] - Bilat leg DVT  TTE -     1. Left ventricular ejection fraction, by visual estimation, is >75%. The left ventricle has hyperdynamic function. There is no left ventricular hypertrophy.  2. Left ventricular diastolic parameters are normal for age. The "impaired relaxation" pattern of mitral inflow is probably  due to low left atrial pressures ("underfilling") and increased cardiac output.  3. The left ventricle has no regional wall motion abnormalities.  4. Global right ventricle has hyperdynamic systolic function.The right ventricular size is normal. No increase in right ventricular wall thickness.  5. Left atrial size was normal.  6. Right atrial size was normal.  7. The mitral valve is normal in structure. No evidence of mitral valve regurgitation.  8. The tricuspid valve is normal in structure.  9. The aortic valve is normal in  structure. Aortic valve regurgitation is not visualized. Mild aortic valve sclerosis without stenosis. 10. The pulmonic valve was grossly normal. Pulmonic valve regurgitation is not visualized. 11. Normal pulmonary artery systolic pressure. 12. The inferior vena cava is collapsed, consistent with low right atrial pressure.     PUD Prophylaxis :   DVT Prophylaxis  :  Heparin    Lab Results  Component Value Date   PLT 365 06/18/2019    Inpatient Medications  Scheduled Meds: . albuterol  2 puff Inhalation Q6H  . vitamin C  500 mg Oral Daily  . Chlorhexidine Gluconate Cloth  6 each Topical Q0600  . dexamethasone (DECADRON) injection  8 mg Intravenous Q24H  . hydrALAZINE  50 mg Oral Q8H  . insulin aspart  0-9 Units Subcutaneous TID WC  . insulin aspart protamine- aspart  20 Units Subcutaneous BID WC  . lidocaine      . [START ON 06/19/2019] metoprolol tartrate  25 mg Oral BID  . pantoprazole  40 mg Oral Daily  . rosuvastatin  40 mg Oral Daily  . sodium chloride flush  3 mL Intravenous Q12H  . warfarin  1 mg Oral ONCE-1800  . Warfarin - Pharmacist Dosing Inpatient   Does not apply q1800  . zinc sulfate  220 mg Oral Daily   Continuous Infusions: . [START ON 06/19/2019] ampicillin-sulbactam (UNASYN) IV    . famotidine (PEPCID) IV 20 mg (06/18/19 0905)  . heparin 650 Units/hr (06/17/19 1138)   PRN Meds:.acetaminophen, chlorpheniramine-HYDROcodone, dextrose **OR** dextrose, guaiFENesin-dextromethorphan, lidocaine  Antibiotics  :    Anti-infectives (From admission, onward)   Start     Dose/Rate Route Frequency Ordered Stop   06/19/19 0536  Ampicillin-Sulbactam (UNASYN) 3 g in sodium chloride 0.9 % 100 mL IVPB     3 g 200 mL/hr over 30 Minutes Intravenous Every 24 hours 06/18/19 1004     06/16/19 1700  Ampicillin-Sulbactam (UNASYN) 3 g in sodium chloride 0.9 % 100 mL IVPB  Status:  Discontinued     3 g 200 mL/hr over 30 Minutes Intravenous Every 12 hours 06/16/19 1621  06/18/19 1004   06/13/19 1000  remdesivir 100 mg in sodium chloride 0.9 % 100 mL IVPB     100 mg 200 mL/hr over 30 Minutes Intravenous Daily 06/18/2019 1612 06/16/19 1104   06/15/2019 1615  cefTRIAXone (ROCEPHIN) 2 g in sodium chloride 0.9 % 100 mL IVPB  Status:  Discontinued     2 g 200 mL/hr over 30 Minutes Intravenous Every 24 hours 06/15/2019 1611 06/13/19 0751   06/02/2019 1615  doxycycline (VIBRAMYCIN) 100 mg in sodium chloride 0.9 % 250 mL IVPB  Status:  Discontinued     100 mg 125 mL/hr over 120 Minutes Intravenous Every 12 hours 05/28/2019 1611 06/13/19 0751   06/18/2019 1615  remdesivir 200 mg in sodium chloride 0.9% 250 mL IVPB     200 mg 580 mL/hr over 30 Minutes Intravenous Once 05/23/2019 1612 06/02/2019 1755  Time Spent in minutes  30   Lala Lund M.D on 06/18/2019 at 5:35 PM  To page go to [email or web address] - password [email or web address]  [facility] -  Office  [phone]   See all Orders from today for further details    Objective:   Vitals:   06/18/19 1200 06/18/19 1527 06/18/19 1547 06/18/19 1635  BP: (!) 93/51 (!) 101/58 (!) 105/57   Pulse: 63 60    Resp: (!) 27 20    Temp:   (!) 93.4 F (34.1 C)   TempSrc:   Rectal   SpO2: 96% 91%  95%  Weight:      Height:        Wt Readings from Last 3 Encounters:  05/24/2019 77.1 kg  05/21/19 78 kg  05/07/19 78 kg     Intake/Output Summary (Last 24 hours) at 06/18/2019 1735 Last data filed at 06/18/2019 1300 Gross per 24 hour  Intake 118 ml  Output 200 ml  Net -82 ml     Physical Exam  Awake Alert, more verbal,  No new F.N deficits, flat affect [place].AT,PERRAL Supple Neck,No JVD, No cervical lymphadenopathy appriciated.  Symmetrical Chest wall movement, Good air movement bilaterally, CTAB RRR,No Gallops, Rubs or new Murmurs, No Parasternal Heave +ve B.Sounds, Abd Soft, No tenderness, No organomegaly appriciated, No rebound - guarding or rigidity. No Cyanosis, Clubbing or edema, No new Rash or bruise    Data  Review:    CBC Recent Labs  Lab 06/14/19 0005 06/15/19 0158 06/16/19 0432 06/16/19 1315 06/17/19 0400 06/18/19 0520  WBC 12.5* 14.1* 15.6* 21.4* 19.1* 20.5*  HGB 11.0* 11.4* 12.0* 13.1 13.2 13.0  HCT 35.8* 36.7* 38.5* 42.6 41.7 40.4  PLT 202 216 261 372 322 365  MCV 84.0 83.0 83.0 83.9 81.3 80.3  MCH 25.8* 25.8* 25.9* 25.8* 25.7* 25.8*  MCHC 30.7 31.1 31.2 30.8 31.7 32.2  RDW 15.2 15.5 15.8* 15.9* 15.9* 15.9*  LYMPHSABS 0.6* 0.7 0.6*  --  0.6* 0.6*  MONOABS 0.3 0.6 0.6  --  0.8 0.8  EOSABS 0.0 0.0 0.0  --  0.0 0.0  BASOSABS 0.0 0.0 0.0  --  0.1 0.1    Chemistries  Recent Labs  Lab 06/13/19 0509 06/14/19 0005 06/15/19 0158 06/16/19 0432 06/17/19 0400 06/18/19 0520  NA 146* 140 142 140 145 137  K 4.0 3.3* 3.9 3.9 4.2 3.7  CL 112* 109 108 110 110 102  CO2 23 20* 19* 20* 20* 17*  GLUCOSE 203* 210* 120* 168* 210* 124*  BUN 52* 59* 68* 73* 88* 106*  CREATININE 3.68* 3.74* 4.47* 4.57* 5.26* 6.67*  CALCIUM 8.0* 7.7* 7.8* 8.0* 8.4* 7.3*  MG 1.9 2.2 1.9 2.0 2.3 2.6*  AST 33 59* 59* 53* 54*  --   ALT 22 32 34 33 31  --   ALKPHOS 70 80 89 102 123  --   BILITOT 0.4 0.6 0.5 0.6 0.8  --    ------------------------------------------------------------------------------------------------------------------ No results for input(s): CHOL, HDL, LDLCALC, TRIG, CHOLHDL, LDLDIRECT in the last 72 hours.  Lab Results  Component Value Date   HGBA1C 9.6 (H) 06/13/2019   ------------------------------------------------------------------------------------------------------------------ Recent Labs    06/16/19 1655  TSH 0.310*    Cardiac Enzymes No results for input(s): CKMB, TROPONINI, MYOGLOBIN in the last 168 hours.  Invalid input(s): CK ------------------------------------------------------------------------------------------------------------------    Component Value Date/Time   BNP 512.3 (H) 06/18/2019 0370    Micro Results Recent Results (from the past 240 hour(s))   Blood Culture (routine  x 2)     Status: None   Collection Time: 05/24/2019  1:19 PM   Specimen: BLOOD  Result Value Ref Range Status   Specimen Description BLOOD SITE NOT SPECIFIED  Final   Special Requests   Final    BOTTLES DRAWN AEROBIC AND ANAEROBIC Blood Culture adequate volume   Culture   Final    NO GROWTH 5 DAYS Performed at [facility], [address] [place], [place] [address]    Report Status 06/17/2019 FINAL  Final  Blood Culture (routine x 2)     Status: None   Collection Time: 06/11/2019  1:41 PM   Specimen: BLOOD  Result Value Ref Range Status   Specimen Description BLOOD LEFT ANTECUBITAL  Final   Special Requests   Final    BOTTLES DRAWN AEROBIC AND ANAEROBIC Blood Culture adequate volume   Culture   Final    NO GROWTH 5 DAYS Performed at [facility], [place] [address], [place], [place] [address]    Report Status 06/17/2019 FINAL  Final  Urine culture     Status: Abnormal   Collection Time: 05/24/2019  7:04 PM   Specimen: Urine, Random  Result Value Ref Range Status   Specimen Description URINE, RANDOM  Final   Special Requests   Final    NONE Performed at [facility], [place] [address], [place], [place] [address]    Culture MULTIPLE SPECIES PRESENT, SUGGEST RECOLLECTION (A)  Final   Report Status 06/14/2019 FINAL  Final    Radiology Reports US RENAL  Result Date: 06/15/2019 CLINICAL DATA:  78 year old male COVID-37. Acute renal injury on chronic renal disease. EXAM: RENAL / URINARY TRACT ULTRASOUND COMPLETE COMPARISON:  Renal ultrasound 08/31/2018. FINDINGS: Right Kidney: Renal measurements: 10.0 x 4.9 x 6.1 centimeters = volume: 158 mL (previously 133). Chronically echogenic right kidney with no hydronephrosis or renal mass. Left Kidney: Renal measurements: 9.0 x 5.6 x 3.8 centimeters = volume: 98 mL (previously 153). Stable left renal cortex which also appears mildly echogenic. Cortical thickness seems better preserved in the left kidney. No  left hydronephrosis or renal mass. Bladder: Appears normal for degree of bladder distention. Other: None. IMPRESSION: No acute renal findings and negative urinary bladder. Chronic medical renal disease suspected. Electronically Signed   By: Genevie Ann M.D.   On: 06/15/2019 08:50   CXR am  Result Date: 06/17/2019 CLINICAL DATA:  Shortness of breath. EXAM: PORTABLE CHEST 1 VIEW COMPARISON:  Chest radiograph 06/16/2019 FINDINGS: Stable cardiomediastinal silhouette. Overlying cardiac monitoring leads. Ill-defined opacity throughout both lungs appears progressed from prior examination. No sizable pleural effusion or evidence of pneumothorax. Nonspecific air distention of nondescript bowel loops within the partially imaged upper abdomen. IMPRESSION: Interval increase in ill-defined opacity throughout both lungs, likely reflecting multifocal pneumonia. Electronically Signed   By: Kellie Simmering DO   On: 06/17/2019 08:43   CXR am  Result Date: 06/16/2019 CLINICAL DATA:  Shortness of breath. EXAM: PORTABLE CHEST 1 VIEW COMPARISON:  06/10/2019 FINDINGS: 1410 hours. Interval progression of patchy ill-defined ground-glass airspace opacities bilaterally. Stable asymmetric elevation left hemidiaphragm. The cardiopericardial silhouette is within normal limits for size. The visualized bony structures of the thorax are intact. Telemetry leads overlie the chest. IMPRESSION: The hazy ill-defined bilateral patchy airspace opacity appears progressed in the mid lungs bilaterally. Electronically Signed   By: Misty Stanley M.D.   On: 06/16/2019 14:20   DG Chest Port 1 View  Result Date: 06/09/2019 CLINICAL DATA:  Cough.  Shortness of breath.  Fever. EXAM: PORTABLE CHEST 1 VIEW COMPARISON:  Chest x-ray 02/03/2012, 06/16/2005. FINDINGS: Mediastinum hilar structures normal. Heart size normal. Diffuse bilateral interstitial prominence. Pneumonitis could present this fashion. Bibasilar atelectasis with right base alveolar infiltrate.  No prominent pleural effusion or pneumothorax. Stable elevation left hemidiaphragm. IMPRESSION: 1. Diffuse bilateral interstitial prominence. Pneumonitis could present this fashion. Bibasilar atelectasis with right base alveolar infiltrate. 2.  Chronic elevation left hemidiaphragm. Electronically Signed   By: Marcello Moores  Register   On: 06/14/2019 13:32   ECHOCARDIOGRAM COMPLETE  Result Date: 06/16/2019   ECHOCARDIOGRAM REPORT   Patient Name:   Quaron Galligan Date of Exam: 06/16/2019 Medical Rec #:  [id number]   Height:       73.0 in Accession #:    [id number]  Weight:       170.0 lb Date of Birth:  14-Feb-1941   BSA:          2.01 m Patient Age:    46 years    BP:           144/79 mmHg Patient Gender: M           HR:           106 bpm. Exam Location:  Inpatient Procedure: 2D Echo STAT ECHO Indications:    Chest Pain 786.50 / R07.9  History:        Patient has no prior history of Echocardiogram examinations.                 Signs/Symptoms:Shortness of Breath; Risk Factors:Hypertension,                 Dyslipidemia and Diabetes. CVA. COVID-19.  Sonographer:    Clayton Lefort RDCS (AE) Referring Phys: [id number] Margaree Mackintosh [facility]  Sonographer Comments: Echocardiogram performed at [place]. IMPRESSIONS  1. Left ventricular ejection fraction, by visual estimation, is >75%. The left ventricle has hyperdynamic function. There is no left ventricular hypertrophy.  2. Left ventricular diastolic parameters are normal for age. The "impaired relaxation" pattern of mitral inflow is probably due to low left atrial pressures ("underfilling") and increased cardiac output.  3. The left ventricle has no regional wall motion abnormalities.  4. Global right ventricle has hyperdynamic systolic function.The right ventricular size is normal. No increase in right ventricular wall thickness.  5. Left atrial size was normal.  6. Right atrial size was normal.  7. The mitral valve is normal in structure. No evidence of mitral valve  regurgitation.  8. The tricuspid valve is normal in structure.  9. The aortic valve is normal in structure. Aortic valve regurgitation is not visualized. Mild aortic valve sclerosis without stenosis. 10. The pulmonic valve was grossly normal. Pulmonic valve regurgitation is not visualized. 11. Normal pulmonary artery systolic pressure. 12. The inferior vena cava is collapsed, consistent with low right atrial pressure. FINDINGS  Left Ventricle: Left ventricular ejection fraction, by visual estimation, is >75%. The left ventricle has hyperdynamic function. The left ventricle has no regional wall motion abnormalities. There is no left ventricular hypertrophy. Left ventricular diastolic parameters are consistent with Grade I diastolic dysfunction (impaired relaxation). Normal left atrial pressure. Right Ventricle: The right ventricular size is normal. No increase in right ventricular wall thickness. Global RV systolic function is has hyperdynamic systolic function. The tricuspid regurgitant velocity is 2.58 m/s, and with an assumed right atrial pressure of 0 mmHg, the estimated right ventricular systolic pressure is normal at 26.6 mmHg. Left Atrium: Left  atrial size was normal in size. Right Atrium: Right atrial size was normal in size Pericardium: There is no evidence of pericardial effusion. Mitral Valve: The mitral valve is normal in structure. No evidence of mitral valve regurgitation. MV peak gradient, 11.4 mmHg. Tricuspid Valve: The tricuspid valve is normal in structure. Tricuspid valve regurgitation is not demonstrated. Aortic Valve: The aortic valve is normal in structure. Aortic valve regurgitation is not visualized. Mild aortic valve sclerosis is present, with no evidence of aortic valve stenosis. Aortic valve mean gradient measures 6.0 mmHg. Aortic valve peak gradient measures 10.9 mmHg. Aortic valve area, by VTI measures 2.66 cm. Pulmonic Valve: The pulmonic valve was grossly normal. Pulmonic valve  regurgitation is not visualized. Pulmonic regurgitation is not visualized. Aorta: The aortic root is normal in size and structure. Venous: The inferior vena cava is collapsed, consistent with low right atrial pressure. IAS/Shunts: No atrial level shunt detected by color flow Doppler.  LEFT VENTRICLE PLAX 2D LVIDd:         3.51 cm  Diastology LVIDs:         2.08 cm  LV e' lateral:   12.00 cm/s LV PW:         1.18 cm  LV E/e' lateral: 9.6 LV IVS:        1.15 cm  LV e' medial:    8.16 cm/s LVOT diam:     2.00 cm  LV E/e' medial:  14.1 LV SV:         37 ml LV SV Index:   18.67 LVOT Area:     3.14 cm  RIGHT VENTRICLE             IVC RV Basal diam:  3.43 cm     IVC diam: 1.23 cm RV S prime:     16.80 cm/s TAPSE (M-mode): 3.1 cm LEFT ATRIUM             Index       RIGHT ATRIUM           Index LA diam:        3.20 cm 1.59 cm/m  RA Area:     18.90 cm LA Vol (A2C):   65.2 ml 32.47 ml/m RA Volume:   55.30 ml  27.54 ml/m LA Vol (A4C):   57.3 ml 28.54 ml/m LA Biplane Vol: 64.0 ml 31.87 ml/m  AORTIC VALVE AV Area (Vmax):    2.59 cm AV Area (Vmean):   2.70 cm AV Area (VTI):     2.66 cm AV Vmax:           165.00 cm/s AV Vmean:          113.000 cm/s AV VTI:            0.290 m AV Peak Grad:      10.9 mmHg AV Mean Grad:      6.0 mmHg LVOT Vmax:         136.00 cm/s LVOT Vmean:        97.267 cm/s LVOT VTI:          0.246 m LVOT/AV VTI ratio: 0.85  AORTA Ao Root diam: 2.80 cm MITRAL VALVE                         TRICUSPID VALVE MV Area (PHT): 3.08 cm              TR Peak grad:   26.6 mmHg MV Peak grad:  11.4 mmHg             TR Vmax:        258.00 cm/s MV Mean grad:  5.0 mmHg MV Vmax:       1.69 m/s              SHUNTS MV Vmean:      105.0 cm/s            Systemic VTI:  0.25 m MV VTI:        0.32 m                Systemic Diam: 2.00 cm MV PHT:        71.34 msec MV Decel Time: 246 msec MV E velocity: 115.00 cm/s 103 cm/s MV A velocity: 170.00 cm/s 70.3 cm/s MV E/A ratio:  0.68        1.5  Mihai Croitoru MD Electronically signed  by Sanda Klein MD Signature Date/Time: 06/16/2019/3:34:03 PM    Final    Leg [place] [facility]  Result Date: 06/13/2019  Lower Venous Study Indications: Elevated Ddimer.  Risk Factors: COVID 19 positive. Comparison Study: No prior studies. Performing Technologist: Oliver Hum RVT  Examination Guidelines: A complete evaluation includes B-mode imaging, spectral Doppler, color Doppler, and power Doppler as needed of all accessible portions of each vessel. Bilateral testing is considered an integral part of a complete examination. Limited examinations for reoccurring indications may be performed as noted.  +---------+---------------+---------+-----------+----------+--------------+ RIGHT    CompressibilityPhasicitySpontaneityPropertiesThrombus Aging +---------+---------------+---------+-----------+----------+--------------+ CFV      Full           Yes      Yes                                 +---------+---------------+---------+-----------+----------+--------------+ SFJ      Full                                                        +---------+---------------+---------+-----------+----------+--------------+ FV Prox  Full                                                        +---------+---------------+---------+-----------+----------+--------------+ FV Mid   Full                                                        +---------+---------------+---------+-----------+----------+--------------+ FV DistalFull                                                        +---------+---------------+---------+-----------+----------+--------------+ PFV      Full                                                        +---------+---------------+---------+-----------+----------+--------------+  POP      Full           Yes      Yes                                 +---------+---------------+---------+-----------+----------+--------------+ PTV      None                                          Acute          +---------+---------------+---------+-----------+----------+--------------+ PERO     Full                                                        +---------+---------------+---------+-----------+----------+--------------+   +---------+---------------+---------+-----------+----------+--------------+ LEFT     CompressibilityPhasicitySpontaneityPropertiesThrombus Aging +---------+---------------+---------+-----------+----------+--------------+ CFV      Full           Yes      Yes                                 +---------+---------------+---------+-----------+----------+--------------+ SFJ      Full                                                        +---------+---------------+---------+-----------+----------+--------------+ FV Prox  Full                                                        +---------+---------------+---------+-----------+----------+--------------+ FV Mid   Full                                                        +---------+---------------+---------+-----------+----------+--------------+ FV DistalFull                                                        +---------+---------------+---------+-----------+----------+--------------+ PFV      Full                                                        +---------+---------------+---------+-----------+----------+--------------+ POP      Full           Yes      Yes                                 +---------+---------------+---------+-----------+----------+--------------+  PTV      Full                                                        +---------+---------------+---------+-----------+----------+--------------+ PERO     Full                                                        +---------+---------------+---------+-----------+----------+--------------+ SSV      None                                         Acute           +---------+---------------+---------+-----------+----------+--------------+     Summary: Right: Findings consistent with acute deep vein thrombosis involving the right peroneal veins. No cystic structure found in the popliteal fossa. Left: Findings consistent with acute superficial vein thrombosis involving the left small saphenous vein. No cystic structure found in the popliteal fossa.  *See table(s) above for measurements and observations. Electronically signed by Monica Martinez MD on 06/13/2019 at 5:23:13 PM.    Final

## 2019-06-18 NOTE — Progress Notes (Signed)
OT Cancellation Note  Patient Details Name: Steve Martinez MRN: [id number] DOB: December 23, 1940   Cancelled Treatment:    Reason Eval/Treat Not Completed: Patient at procedure or test/ unavailable. Per RN, pt tranferred to [facility] for [facility] cath placement & dialysis. OT will follow up.   Lyman Bishop 06/18/2019, 4:57 PM

## 2019-06-18 NOTE — Progress Notes (Signed)
Steve Martinez for Heparin/Warfarin Indication: BL Acute  DVT  12/24   No Known Allergies  Patient Measurements: Height: 6\' 1"  (185.4 cm) Weight: 170 lb (77.1 kg) IBW/kg (Calculated) : 79.9 Heparin Dosing Weight: 77 kg  Vital Signs: Temp: 97.4 F (36.3 C) (12/29 2101) Temp Source: Axillary (12/29 2101) BP: 99/49 (12/29 2101) Pulse Rate: 65 (12/29 2101)  Labs: Recent Labs    06/16/19 0432 06/16/19 1315 06/17/19 0400 06/18/19 0520  HGB 12.0* 13.1 13.2 13.0  HCT 38.5* 42.6 41.7 40.4  PLT 261 372 322 365  LABPROT 22.4*  --  22.8* 27.1*  INR 2.0*  --  2.0* 2.5*  HEPARINUNFRC 0.37  --  0.20* 0.44  CREATININE 4.57*  --  5.26* 6.67*    Estimated Creatinine Clearance: 10 mL/min (A) (by C-G formula based on SCr of 6.67 mg/dL (H)).   Assessment: 78 yr old male with ESRD admited with respiratory failure due to COVID-PNA, was on heparin 7500 unit [place] q 8 h for VTE prophylaxis. D-dimer is extremely elevated, 12/24 LE US shows acute DVT involving the R peroneal veins and acute superficial vein thrombosis in the L small saphenous vein. Pharmacy consulted to dose heparin/warfarin for VTE treatment.  Today is overlap day 4 on heparin/warfarin. INR increased to 2.5 today, up from 2.0 yesterday (therapeutic). Heparin level earlier today on heparin infusion at 650 units/hr was  therapeutic at 0.44 units/ml. H/H, platelets WNL.   Pt was transferred from [place] to [facility] this afternoon for placement of tunneled HD catheter. Per [facility] RN, heparin was not stopped before procedure and heparin infusion was running when pt returned to the nursing unit after the IR procedure. Pt was having some bleeding at the tunneled catheter insertion site. Pt was also hypotensive and was evaluated by CCM for transfer to unit 36M (transfer pending). In the meantime, warfarin order was discontinued, and RN held heparin for ~1.5 hrs; heparin was restarted ~7:30 PM. I spoke with  Dr. Audria Nine of C[facility]; she advised that we should follow protocol for timing of restarting heparin after tunnel catheter placement by IR. She also said to restart warfarin, anticipating being able to stop heparin infusion tomorrow AM when INR results available (if INR remains therapeutic).   Goal of Therapy:  Heparin level 0.3-0.7 units/ml  INR 2-3 Monitor platelets by anticoagulation protocol: Yes   Plan:  Restart heparin infusion at 650 units/hr at 2300 tonight, per [facility] protocol for timing of restarting heparin post-IR procedure Check heparin level ~8 hrs after restarting heparin infusion Give warfarin 1 mg po X 1 tonight Monitor for signs/symptoms of bleeding Consider D/C heparin after tomorrow if INR remains therapeutic F/u daily INR, heparin level, and CBC Will need warfarin education prior to d/c  Gillermina Hu, PharmD, BCPS, [facility] Clinical Pharmacist 06/18/2019

## 2019-06-18 NOTE — Consult Note (Signed)
NAME:  Steve Martinez, MRN:  [id number], DOB:  October 01, 1940, LOS: 6 ADMISSION DATE:  06/20/2019, CONSULTATION DATE:  06/18/2019 REFERRING MD:  Dr. Candiss Norse, CHIEF COMPLAINT:  Hypotension  Brief History   78 year old male admitted 12/23 with progressively worse SOB found to have hypoxic respiratory failure related to COVID-19. Initially admitted to [facility] but course complicated by probable aspiration and worsening AKI requiring transfer to [facility] for iHD. PCCM consulted 12/29 for worsening hypotension.   History of present illness   78 year old male with prior history of HTN, HLD, DMT2, GERD, CKD, CVA with left sided deficits/weakness admitted 12/23 to [facility] after progressive one weak history of worsening shortness of breath found to be in acute hypoxic respiratory failure and COVID- 19 positive after known exposure to wife with COVID.   He was admitted to [facility] and started on oxygen [place], decadron, and remdesivir with aggressive pulmonary hygiene and self proning.  Hospitalization complicated by aspiration event on 12/27 and started on unasyn, acute bilateral leg DVT started on heparin gtt, and worsening AKI with nephrology consulted 12/28.  Patient was transferred to [facility] on 12/29 and underwent placement of tunneled hemodialysis catheter with plans for intermittent dialysis. However, blood pressure continued to decline with concerns for worsening sepsis.  PCCM consulted for closer monitoring in ICU.   Past Medical History  HTN, HLD, DMT2, GERD, CVA with left sided deficits/weakness, MI, CKD  Significant Hospital Events   12/23 Ad,itted [facility] 12/29 transfer [facility]/ TDC placement  Consults:  Nephrology 12/28 PCCM 12/29  Procedures:  12/29 Right TDC >>  Significant Diagnostic Tests:  12/24 DVT BLE [place] >> Right: Findings consistent with acute deep vein thrombosis involving the right peroneal veins. No cystic structure found in the popliteal fossa. Left: Findings consistent with acute superficial vein thrombosis  involving the left small saphenous vein. No cystic structure found in the popliteal fossa.  12/26 renal US >> No acute renal findings and negative urinary bladder. Chronic medical renal disease suspected.  12/27 TTE >>  1. Left ventricular ejection fraction, by visual estimation, is >75%. The left ventricle has hyperdynamic function. There is no left ventricular hypertrophy.  2. Left ventricular diastolic parameters are normal for age. The "impaired relaxation" pattern of mitral inflow is probably due to low left atrial pressures ("underfilling") and increased cardiac output.  3. The left ventricle has no regional wall motion abnormalities.  4. Global right ventricle has hyperdynamic systolic function.The right ventricular size is normal. No increase in right ventricular wall thickness.  5. Left atrial size was normal.  6. Right atrial size was normal.  7. The mitral valve is normal in structure. No evidence of mitral valve regurgitation.  8. The tricuspid valve is normal in structure.  9. The aortic valve is normal in structure. Aortic valve regurgitation is not visualized. Mild aortic valve sclerosis without stenosis. 10. The pulmonic valve was grossly normal. Pulmonic valve regurgitation is not visualized. 11. Normal pulmonary artery systolic pressure. 12. The inferior vena cava is collapsed, consistent with low right atrial pressure.  Micro Data:  12/23 SARS Coronavirus 2 POSITIVE 12/23 Blood Negative 12/23 Urine multiple species  Antimicrobials:  Amp/Sulbactam 12/27>> Ceftriaxone 12/23 x 1 dose Doxycycline 12/23 x 1 dose Remdesivir 12/23>>12/27 Decadron 12/23>>  Interim history/subjective:  Pt transferring to 3MICU.  Objective   Blood pressure (!) 101/57, pulse 61, temperature (!) 93.4 F (34.1 C), temperature source Rectal, resp. rate (!) 22, height 6\' 1"  (1.854 m), weight 77.1 kg, SpO2 90 %.  FiO2 (%):  [100 %] 100 %   Intake/Output Summary (Last 24 hours) at  06/18/2019 1750 Last data filed at 06/18/2019 1300 Gross per 24 hour  Intake 118 ml  Output 200 ml  Net -82 ml   Filed Weights   05/27/2019 1258  Weight: 77.1 kg    Examination: General: chronically ill, disheveled male awake following command hoh in nad HENT: ncat, perrla Lungs: rhonchi bilaterally with diminished bases, new R perm cath placed Cardiovascular: rrr Abdomen: soft non tender non distended bs + Extremities: BLE edema. Moves all 4 Neuro: no focal deficits but globally weak GU: defered  Resolved Hospital Problem list    Assessment & Plan:   Acute hypoxic respiratory failure 2/2 COVID 19 Pneumonitis c/b aspiration pneumonia with possible sepsis as he has become hypotensive and hypothermic. Procal elevated at 2.19.  Unasyn initiated by [facility]. Has completed remdesivir. Remains on decadron.  Had norvasc, lopressor and lasix today. Has received 250cc bolus.    Plan: Transfer to 65M ICU Wean O2 for SPO2 >/= 88% Self prone as able Continue decadron, abx, Vitamin C, Zinc  Completed remdesivir IVF Pressors prn MAP >/= 65  Check lactic acid  TSH was 0.310 on 12/27. 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  May ultimately need CRRT if continues to be hypotensive   Dysphagia. SLP following. On Dysphagia 3 diet. Plan was for MBS when able and NPO if resp distress Plan: NPO for now Will need Cor trak   B/L DVT on coumadin with therapeutic INR.  Plan: On heparin overlap day 4/5 per pharm notes. INR therapeutic and would stop heparin gtt tomorrow in light of that unless acute change overnight warranting stopping of PO meds in favor of heparin gtt.   DM II. A1c 9.6 06/13/2019. On steroids Plan: Change SSI to Q4H Continue novolog for now   Colonic distension by CXR 12/28 Plan: KUB   Best practice:  Diet: NPO Pain/Anxiety/Delirium protocol (if indicated): n/a VAP protocol (if indicated): n/a DVT  prophylaxis: heparin gtt GI prophylaxis: PPI Glucose control: SSI Mobility: BR Code Status: Full  Family Communication: per primary at [facility] today. Will update tomorrow.  Disposition: tx to ICU  Labs   CBC: Recent Labs  Lab 06/14/19 0005 06/15/19 0158 06/16/19 0432 06/16/19 1315 06/17/19 0400 06/18/19 0520  WBC 12.5* 14.1* 15.6* 21.4* 19.1* 20.5*  NEUTROABS 11.3* 12.5* 14.2*  --  17.4* 18.6*  HGB 11.0* 11.4* 12.0* 13.1 13.2 13.0  HCT 35.8* 36.7* 38.5* 42.6 41.7 40.4  MCV 84.0 83.0 83.0 83.9 81.3 80.3  PLT 202 216 261 372 322 [id number]    Basic Metabolic Panel: Recent Labs  Lab 06/13/19 0509 06/14/19 0005 06/15/19 0158 06/16/19 0432 06/17/19 0400 06/18/19 0520  NA 146* 140 142 140 145 137  K 4.0 3.3* 3.9 3.9 4.2 3.7  CL 112* 109 108 110 110 102  CO2 23 20* 19* 20* 20* 17*  GLUCOSE 203* 210* 120* 168* 210* 124*  BUN 52* 59* 68* 73* 88* 106*  CREATININE 3.68* 3.74* 4.47* 4.57* 5.26* 6.67*  CALCIUM 8.0* 7.7* 7.8* 8.0* 8.4* 7.3*  MG 1.9 2.2 1.9 2.0 2.3 2.6*  PHOS 2.8  --   --   --   --   --    GFR: Estimated Creatinine Clearance: 10 mL/min (A) (by C-G formula based on SCr of 6.67 mg/dL (H)). Recent Labs  Lab 06/06/2019 1324 05/27/2019  1904 06/15/19 0158 06/16/19 0432 06/16/19 1315 06/16/19 1655 06/17/19 0400 06/18/19 0520  PROCALCITON 1.35  --  1.35  --   --  1.23 1.66 2.19  WBC 8.9  --  14.1* 15.6* 21.4*  --  19.1* 20.5*  LATICACIDVEN 2.3* 2.2*  --   --   --   --   --   --     Liver Function Tests: Recent Labs  Lab 06/13/19 0509 06/14/19 0005 06/15/19 0158 06/16/19 0432 06/17/19 0400  AST 33 59* 59* 53* 54*  ALT 22 32 34 33 31  ALKPHOS 70 80 89 102 123  BILITOT 0.4 0.6 0.5 0.6 0.8  PROT 6.8 6.7 6.3* 6.9 7.2  ALBUMIN 1.9* 2.1* 2.0* 2.0* 2.2*   No results for input(s): LIPASE, AMYLASE in the last 168 hours. No results for input(s): AMMONIA in the last 168 hours.  ABG No results found for: PHART, PCO2ART, PO2ART, HCO3, TCO2, ACIDBASEDEF, O2SAT    Coagulation Profile: Recent Labs  Lab 06/13/19 1248 06/16/19 0432 06/17/19 0400 06/18/19 0520  INR 1.8* 2.0* 2.0* 2.5*    Cardiac Enzymes: No results for input(s): CKTOTAL, CKMB, CKMBINDEX, TROPONINI in the last 168 hours.  HbA1C: Hgb A1c MFr Bld  Date/Time Value Ref Range Status  06/13/2019 05:09 AM 9.6 (H) 4.8 - 5.6 % Final    Comment:    (NOTE) Pre diabetes:          5.7%-6.4% Diabetes:              >6.4% Glycemic control for   <7.0% adults with diabetes     CBG: Recent Labs  Lab 06/18/19 0436 06/18/19 0841 06/18/19 1132 06/18/19 1245 06/18/19 1721  GLUCAP 116* 193* 192* 189* 211*    Review of Systems:   Pt a bit confused at this time. Unreliable historian but states he "believes it;ll be alright" to go to ICU. He denies pain, stated breathing feels "better" with NRB on.   Pt just arrived back from perm cath placement.   Past Medical History  He,  has a past medical history of Back pain, Cataracts, bilateral, Diabetes mellitus, GERD (gastroesophageal reflux disease), Hemorrhoids, History of bronchitis, Hyperlipidemia, Hypertension, Myocardial infarction ([place]) (17-47yrs ago), and Stroke ([place]).   Surgical History    Past Surgical History:  Procedure Laterality Date  . CARDIAC CATHETERIZATION  17-51yrs ago  . CATARACT EXTRACTION W/PHACO  02/08/2012   Procedure: CATARACT EXTRACTION PHACO AND INTRAOCULAR LENS PLACEMENT (IOC);  Surgeon: Adonis Brook, MD;  Location: [place];  Service: Ophthalmology;  Laterality: Right;  . COLONOSCOPY    . REFRACTIVE SURGERY    . SHOULDER SURGERY Left      Social History   reports that he has never smoked. He has never used smokeless tobacco. He reports that he does not drink alcohol or use drugs.   Family History   His family history is negative for Colon cancer, Esophageal cancer, Rectal cancer, and Stomach cancer.   Allergies No Known Allergies   Home Medications  Prior to Admission medications   Medication Sig Start  Date End Date Taking? Authorizing Provider  acetaminophen (TYLENOL) 500 MG tablet Take 1,000 mg by mouth every 6 (six) hours as needed for mild pain or fever.   Yes [provider]  amLODipine (NORVASC) 10 MG tablet Take 10 mg by mouth daily.   Yes [provider]  aspirin EC 325 MG tablet Take 325 mg by mouth daily.   Yes [provider]  carvedilol (COREG)  6.25 MG tablet Take 6.25 mg by mouth 2 (two) times daily with a meal.   Yes [provider]  furosemide (LASIX) 40 MG tablet Take 40 mg by mouth daily.   Yes [provider]  NOVOLOG MIX 70/30 FLEXPEN (70-30) 100 UNIT/ML FlexPen Inject 20-30 Units into the skin 2 (two) times daily with a meal.  04/23/19  Yes [provider]  rosuvastatin (CRESTOR) 40 MG tablet Take 40 mg by mouth daily.   Yes [provider]  ACCU-CHEK AVIVA PLUS test strip  03/18/19   [provider]  BD PEN NEEDLE NANO 2ND GEN 32G X 4 MM MISC  04/09/19   [provider]     Critical care time: The patient is critically ill with multiple organ systems failure and requires high complexity decision making for assessment and support, frequent evaluation and titration of therapies, application of advanced monitoring technologies and extensive interpretation of multiple databases.  Critical care time 37 mins. This represents my time independent of the NPs time taking care of the pt. This is excluding procedures.    [facility] 06/18/2019, 6:41 PM

## 2019-06-18 NOTE — Progress Notes (Signed)
[facility] Progress Note  Subjective: B/Cr up today, some ^SOB per primary.     Patient not examined today directly given COVID-19 + status, utilizing exam of the primary team and observations of RN's.    Vitals:   06/17/19 2155 06/18/19 0000 06/18/19 0631 06/18/19 0800  BP: (!) 125/59 118/61  121/71  Pulse: 73 67 66   Resp:  19 (!) 28 (!) 23  Temp:      TempSrc:      SpO2:  (!) 87% (!) 88%   Weight:      Height:        Exam:  Patient not examined today directly given COVID-19 + status, utilizing exam of the primary team and observations of RN's.      Home meds:  - amlodipine 10/ carvedilol 6.25 bid/ furosemide 40 qd  - rosuvastatin 40/ aspirin 325  - novolog 70/30 20-30 bid     UA negative   Renal US - 9cm/ 10 cm kidneys w/o hydro, ^'d echo    Na 146 K 4.2 CO2 20 BUN 88/ creat 5.2   I/O cumulative 12L in and 5 L out  Assessment/ Plan: 1. AoCKD - get OP records, pt is f/b [facility] for CKD. Admitted 12/24 w/ creat 3.6, now creat 6.6 w/ BUN 106.   UA and renal US negative, euvolemic on exam. Will need dialysis.  Plan is for transfer to [facility], IR consulted for access.   2. DM2 3. HTN 4. COVID+ PNA   Rob Fredrica Capano 06/18/2019, 10:14 AM  Inpatient medications: . albuterol  2 puff Inhalation Q6H  . amLODipine  10 mg Oral Daily  . vitamin C  500 mg Oral Daily  . dexamethasone (DECADRON) injection  3 mg Intravenous Q24H  . hydrALAZINE  50 mg Oral Q8H  . insulin aspart  0-9 Units Subcutaneous TID WC  . insulin aspart protamine- aspart  20 Units Subcutaneous BID WC  . metoprolol tartrate  100 mg Oral BID  . pantoprazole  40 mg Oral Daily  . rosuvastatin  40 mg Oral Daily  . sodium chloride flush  3 mL Intravenous Q12H  . warfarin  1 mg Oral ONCE-1800  . Warfarin - Pharmacist Dosing Inpatient   Does not apply q1800  . zinc sulfate  220 mg Oral Daily   . [START ON 06/19/2019] ampicillin-sulbactam (UNASYN) IV    . famotidine (PEPCID) IV 20 mg (06/18/19  0905)  . heparin 650 Units/hr (06/17/19 1138)   acetaminophen, chlorpheniramine-HYDROcodone, dextrose **OR** dextrose, guaiFENesin-dextromethorphan

## 2019-06-18 NOTE — Plan of Care (Signed)
Pt A&Ox4 w/ delayed speech. VSS, SpO2 >88% on NRB for transport to [facility]. Bgs stable w/ sliding scale insulin coverage. No c/o pain throughout shift.  Syncopal event, see independent note for details  [facility] called regarding pt transportation needs [phone]) [facility] RN, Gabe called for report w/ all questions answered ([phone])   Attempted to call pt family member for update & transfer. No answer x2 for Joann ([phone])  Tomie China    Problem: Education: Goal: Knowledge of risk factors and measures for prevention of condition will improve Outcome: Progressing   Problem: Coping: Goal: Psychosocial and spiritual needs will be supported Outcome: Progressing   Problem: Respiratory: Goal: Will maintain a patent airway Outcome: Progressing Goal: Complications related to the disease process, condition or treatment will be avoided or minimized Outcome: Progressing

## 2019-06-18 NOTE — Progress Notes (Addendum)
ANTICOAGULATION CONSULT NOTE   Pharmacy Consult for Heparin/coumadin Indication: BL acute  DVT  12/24   No Known Allergies  Patient Measurements: Height: 6\' 1"  (185.4 cm) Weight: 170 lb (77.1 kg) IBW/kg (Calculated) : 79.9 Heparin Dosing Weight: 77 kg  Vital Signs: BP: 121/71 (12/29 0800) Pulse Rate: 66 (12/29 0631)  Labs: Recent Labs    06/16/19 0432 06/16/19 1315 06/17/19 0400 06/18/19 0520  HGB 12.0* 13.1 13.2 13.0  HCT 38.5* 42.6 41.7 40.4  PLT 261 372 322 365  LABPROT 22.4*  --  22.8* 27.1*  INR 2.0*  --  2.0* 2.5*  HEPARINUNFRC 0.37  --  0.20* 0.44  CREATININE 4.57*  --  5.26* 6.67*    Estimated Creatinine Clearance: 10 mL/min (A) (by C-G formula based on SCr of 6.67 mg/dL (H)).   Assessment: 78 y/o M admited with respiratory failure due to COVID-PNA on heparin 7500 unit [place] q 8 h for VTE ppx. D-dimer is extremely elevated, 12/24 LE US shows acute DVT involving the R peroneal veins and acute superficial vein thrombosis in the L small saphenous vein.  INR increased significantly to 2.5Heparin level therapeutic at 0.44, Hg 13, PTLC 365  No bleeding noted  Overlap day 4/5  Goal of Therapy:  Heparin level 0.3-0.7 units/ml  INR 2-3 Monitor platelets by anticoagulation protocol: Yes   Plan:  Continue heparin infusion at 650 units / hr Coumadin 1 mg po tonight Consider D/C heparin after tomorrow if INR remains therapeutic F/u daily INR, heparin level, and CBC Will need coumadin education prior to d/c  Thank you Ulice Dash, PharmD, BCPS 06/18/2019

## 2019-06-18 NOTE — Progress Notes (Signed)
Pharmacy Antibiotic Note  Steve Martinez is a 78 y.o. male admitted on 06/02/2019 with pneumonia.  Pharmacy has been consulted for Unasyn dosing.  Pt has been here for COVID. Adding Unasyn today due to concern for asp PNA. He has very poor renal function. Plan per [facility] is to transfer to [facility] for HD cath.   Plan: Decrease Unasyn to 3 g iv q 24h F/U renal replacement  F/u with LOT  Height: 6\' 1"  (185.4 cm) Weight: 170 lb (77.1 kg) IBW/kg (Calculated) : 79.9  Temp (24hrs), Avg:97.4 F (36.3 C), Min:97.4 F (36.3 C), Max:97.4 F (36.3 C)  Recent Labs  Lab 05/28/2019 1324 06/07/2019 1324 06/03/2019 1904 06/14/19 0005 06/15/19 0158 06/16/19 0432 06/16/19 1315 06/17/19 0400 06/18/19 0520  WBC 8.9   < >  --  12.5* 14.1* 15.6* 21.4* 19.1* 20.5*  CREATININE 4.67*  --  4.24* 3.74* 4.47* 4.57*  --  5.26* 6.67*  LATICACIDVEN 2.3*  --  2.2*  --   --   --   --   --   --    < > = values in this interval not displayed.    Estimated Creatinine Clearance: 10 mL/min (A) (by C-G formula based on SCr of 6.67 mg/dL (H)).    No Known Allergies  Antimicrobials this admission:   Dose adjustments this admission:  Microbiology results: 12/23 blood>>ngf 12/23 urine>>contaminant  Ulice Dash, PharmD, BCPS 06/18/2019 10:05 AM

## 2019-06-18 NOTE — Progress Notes (Signed)
Pt assisted to the commode & became unresponsive. Maintained posture while sitting, pulse rate, & O2 sats. Was unable to follow commands during this time. Lasted approximately 3 minutes before pt was able to respond to writers touch/voice. Rapid response RNs at bedside to evaluate. Vitals taken ( BP 93/51(65), HR 65, RR 27, O2 96% on NRB & 12L HFNC). RR RNs stated that incident was most likely a syncopal event. Provider made aware of situation w/ current status. Pt helped back into recliner by Probation officer & both RR RNs.   Tomie China

## 2019-06-18 NOTE — Progress Notes (Signed)
RN transferred patient to [facility] 2 person assist. Patient was a max assist. Went from 4L HFNC to 10

## 2019-06-18 NOTE — Progress Notes (Signed)
  Speech Language Pathology Treatment: Dysphagia  Patient Details Name: Steve Martinez MRN: [id number] DOB: 10-28-1940 Today's Date: 06/18/2019 Time: [id number] SLP Time Calculation (min) (ACUTE ONLY): 25 min  Assessment / Plan / Recommendation Clinical Impression  Pt sitting in chair upon SLP arrival with non rebreather mask over mouth but not nose and O2 sats mid-low 80's. Oxygen level increased to low-mid 90's. Goal of session initially was to further evaluate safety with thins, assess risk of silent aspiration and need for instrumental testing with MBS. He appeared much more fatigued than yesterday and dyspneic. Consumed thin via straw and masticated/transited cracker with respiratory effort remaining relatively stable- no overt s/s aspiration.  SLP assisted pt to bedside commode with RN and pt started to have a staring gaze, not responding to deep pressure stimuli and began to drool (mildly). This lasted for approx 3 minutes until he began to speak and follow commands. Rapid response nurse arrived and found pt's blood pressure to be low (93/51) and suspected incident was most likely a syncopal event.  Increased concern for aspiration with respiratory effort. Discussed with RN, will continue Dys 3/thin for now only when appropriate- sats stable and not working excessively hard to breathe or do not allow po vs need for npo. SLP had planned for MBS to objectively assess swallow when able- per RN he is being transferred to [facility] for need for dialysis catheter to start HD. Will continue to follow.       HPI HPI: 78 y.o male w/ hx of CVA, MI, HTN, HLD, bronchitis, GERD, DM, B cataracts, back pain. Pt also w/ hx of shoulder surgery, refractive surgery abd cardiac cath. pt presented to hospital w/ c/o progressiverly worsening SOB over a week and was dx with acute hypoxic respiratory failure due to COVID 19 PNA. Underwent bedside swallow eval with ST and doung to have no s/s aspiration, O2 sats stable, no  supplemental O2 and RR stable. On 12/27 noted to have increased O2 needs after working with OT. MD note states "clinically it appears pt has likely aspirated 12/27" and swallow eval re-ordered. CXR 12/28 Interval increase in ill-defined opacity throughout both lungs, likely reflecting multifocal pneumonia.      SLP Plan  Continue with current plan of care       Recommendations  Diet recommendations: Dysphagia 3 (mechanical soft);Thin liquid Liquids provided via: Cup;Straw Medication Administration: Whole meds with puree Supervision: Patient able to self feed;Full supervision/cueing for compensatory strategies Compensations: Slow rate;Small sips/bites Postural Changes and/or Swallow Maneuvers: Seated upright 90 degrees                Oral Care Recommendations: Oral care BID Follow up Recommendations: Other (comment)(TBD) SLP Visit Diagnosis: Dysphagia, unspecified (R13.10) Plan: Continue with current plan of care       GO                Houston Siren 06/18/2019, 1:08 PM   [name] Colvin Caroli.Ed Risk analyst [phone] Office [phone]

## 2019-06-18 NOTE — Procedures (Signed)
Pre-procedure Diagnosis: ESRD Post-procedure Diagnosis: Same  Successful placement of tunneled HD catheter with tips terminating within the superior aspect of the right atrium.    Complications: None Immediate  EBL: Minimal   The catheter is ready for immediate use.   Jay Wentworth Edelen, MD Pager #: [phone]   

## 2019-06-18 NOTE — Progress Notes (Signed)
Alson Tiegs [id number] Admission Data: 06/18/2019 7:09 PM Attending Provider: Thurnell Lose, MD  [id number], Steve Baltimore, MD Consults/ Treatment Team: Treatment Team:  Steve Jaffe, MD  Steve Martinez is a 78 y.o. male patient admitted from ED awake, alert  & orientated  X 3,  Full Code, VSS - Blood pressure (!) 101/57, pulse 61, temperature (!) 93.4 F (34.1 C), temperature source Rectal, resp. rate (!) 22, height 6\' 1"  (1.854 m), weight 77.1 kg, SpO2 90 %., O2   93% on Non rebreather mask at 15 L . Tele # placed and pt is currently running:normal sinus rhythm.   IV site WDL:  Iv LFA with a transparent dsg that's clean dry and intact.  Allergies:  No Known Allergies   Past Medical History:  Diagnosis Date  . Back pain    occasionally d/t pulled muscle  . Cataracts, bilateral   . Diabetes mellitus    lantus bid  . GERD (gastroesophageal reflux disease)   . Hemorrhoids   . History of bronchitis    last year  . Hyperlipidemia    takes Zocor daily  . Hypertension    takes Diltiazem,Maxzide,and Quinapril daily  . Myocardial infarction ([place]) 17-30yrs ago  . Stroke ([place])    x 3;left side weaker    Pt orientation to unit, room and routine. Information packet given to patient/family and safety video watched.  Admission INP armband ID verified with patient/family, and in place. SR up x 2, fall risk assessment complete with Patient and family verbalizing understanding of risks associated with falls. Pt verbalizes an understanding of how to use the call bell and to call for help before getting out of bed.  Skin, dry and no skin breakdown noted.  Will cont to monitor and assist as needed.    Steve Martinez Shelda Pal, RN 06/18/2019 7:09 PM

## 2019-06-18 NOTE — Progress Notes (Signed)
    Vital Signs MEWS/VS Documentation      06/18/2019 1547 06/18/2019 1635 06/18/2019 1723 06/18/2019 1800   MEWS Score:  2  2  3  2    MEWS Score Color:  Yellow  Yellow  Yellow  Yellow   Resp:  --  --  (!) 22  20   Pulse:  --  --  61  61   BP:  (!) 105/57  --  (!) 101/57  (!) 106/57   Temp:  (!) 93.4 F (34.1 C)  --  --  --   O2 Device:  --  --  Non-rebreather Mask  Non-rebreather Mask   O2 Flow Rate (L/min):  --  --  15 L/min  15 L/min   Level of Consciousness:  --  --  Alert  --      Patient Alert and oriented on a non-rebreather  mask at 15 L, patient, return to room after IR  Placed Tunneled catheter, bleeding at the site noted, pt was assessed by critical care doctor and are aware of iit.  Critical care doctor Janett Billow has put in placement orders for pt to be transferred to ICU. Report given to night shift RN .      Fortunata Betty A Rivera Montejano 06/18/2019,7:52 PM

## 2019-06-19 DIAGNOSIS — E1142 Type 2 diabetes mellitus with diabetic polyneuropathy: Secondary | ICD-10-CM | POA: Diagnosis not present

## 2019-06-19 DIAGNOSIS — E1165 Type 2 diabetes mellitus with hyperglycemia: Secondary | ICD-10-CM

## 2019-06-19 DIAGNOSIS — I639 Cerebral infarction, unspecified: Secondary | ICD-10-CM | POA: Diagnosis not present

## 2019-06-19 DIAGNOSIS — E1140 Type 2 diabetes mellitus with diabetic neuropathy, unspecified: Secondary | ICD-10-CM | POA: Diagnosis not present

## 2019-06-19 DIAGNOSIS — U071 COVID-19: Principal | ICD-10-CM

## 2019-06-19 DIAGNOSIS — N186 End stage renal disease: Secondary | ICD-10-CM

## 2019-06-19 DIAGNOSIS — I10 Essential (primary) hypertension: Secondary | ICD-10-CM | POA: Diagnosis not present

## 2019-06-19 DIAGNOSIS — N179 Acute kidney failure, unspecified: Secondary | ICD-10-CM

## 2019-06-19 DIAGNOSIS — N183 Chronic kidney disease, stage 3 unspecified: Secondary | ICD-10-CM | POA: Diagnosis not present

## 2019-06-19 DIAGNOSIS — Z794 Long term (current) use of insulin: Secondary | ICD-10-CM

## 2019-06-19 DIAGNOSIS — N184 Chronic kidney disease, stage 4 (severe): Secondary | ICD-10-CM | POA: Diagnosis not present

## 2019-06-19 DIAGNOSIS — E7800 Pure hypercholesterolemia, unspecified: Secondary | ICD-10-CM | POA: Diagnosis not present

## 2019-06-19 DIAGNOSIS — E1122 Type 2 diabetes mellitus with diabetic chronic kidney disease: Secondary | ICD-10-CM | POA: Diagnosis not present

## 2019-06-19 DIAGNOSIS — R0902 Hypoxemia: Secondary | ICD-10-CM

## 2019-06-19 LAB — CBC WITH DIFFERENTIAL/PLATELET
Abs Immature Granulocytes: 0.2 10*3/uL — ABNORMAL HIGH (ref 0.00–0.07)
Basophils Absolute: 0 10*3/uL (ref 0.0–0.1)
Basophils Relative: 0 %
Eosinophils Absolute: 0 10*3/uL (ref 0.0–0.5)
Eosinophils Relative: 0 %
HCT: 33.2 % — ABNORMAL LOW (ref 39.0–52.0)
Hemoglobin: 10.8 g/dL — ABNORMAL LOW (ref 13.0–17.0)
Immature Granulocytes: 1 %
Lymphocytes Relative: 2 %
Lymphs Abs: 0.4 10*3/uL — ABNORMAL LOW (ref 0.7–4.0)
MCH: 25.8 pg — ABNORMAL LOW (ref 26.0–34.0)
MCHC: 32.5 g/dL (ref 30.0–36.0)
MCV: 79.2 fL — ABNORMAL LOW (ref 80.0–100.0)
Monocytes Absolute: 0.7 10*3/uL (ref 0.1–1.0)
Monocytes Relative: 4 %
Neutro Abs: 17.5 10*3/uL — ABNORMAL HIGH (ref 1.7–7.7)
Neutrophils Relative %: 93 %
Platelets: 311 10*3/uL (ref 150–400)
RBC: 4.19 MIL/uL — ABNORMAL LOW (ref 4.22–5.81)
RDW: 16 % — ABNORMAL HIGH (ref 11.5–15.5)
WBC: 18.9 10*3/uL — ABNORMAL HIGH (ref 4.0–10.5)
nRBC: 0 % (ref 0.0–0.2)

## 2019-06-19 LAB — HEPATITIS B CORE ANTIBODY, TOTAL: Hep B Core Total Ab: NONREACTIVE

## 2019-06-19 LAB — RENAL FUNCTION PANEL
Albumin: 1.6 g/dL — ABNORMAL LOW (ref 3.5–5.0)
Anion gap: 17 — ABNORMAL HIGH (ref 5–15)
BUN: 97 mg/dL — ABNORMAL HIGH (ref 8–23)
CO2: 18 mmol/L — ABNORMAL LOW (ref 22–32)
Calcium: 6.3 mg/dL — CL (ref 8.9–10.3)
Chloride: 107 mmol/L (ref 98–111)
Creatinine, Ser: 5.65 mg/dL — ABNORMAL HIGH (ref 0.61–1.24)
GFR calc Af Amer: 10 mL/min — ABNORMAL LOW (ref >60)
GFR calc non Af Amer: 9 mL/min — ABNORMAL LOW (ref >60)
Glucose, Bld: 161 mg/dL — ABNORMAL HIGH (ref 70–99)
Phosphorus: 5.3 mg/dL — ABNORMAL HIGH (ref 2.5–4.6)
Potassium: 4.5 mmol/L (ref 3.5–5.1)
Sodium: 142 mmol/L (ref 135–145)

## 2019-06-19 LAB — COMPREHENSIVE METABOLIC PANEL
ALT: 25 U/L (ref 0–44)
AST: 59 U/L — ABNORMAL HIGH (ref 15–41)
Albumin: 1.6 g/dL — ABNORMAL LOW (ref 3.5–5.0)
Alkaline Phosphatase: 168 U/L — ABNORMAL HIGH (ref 38–126)
Anion gap: 18 — ABNORMAL HIGH (ref 5–15)
BUN: 135 mg/dL — ABNORMAL HIGH (ref 8–23)
CO2: 17 mmol/L — ABNORMAL LOW (ref 22–32)
Calcium: 6.7 mg/dL — ABNORMAL LOW (ref 8.9–10.3)
Chloride: 109 mmol/L (ref 98–111)
Creatinine, Ser: 7.89 mg/dL — ABNORMAL HIGH (ref 0.61–1.24)
GFR calc Af Amer: 7 mL/min — ABNORMAL LOW (ref >60)
GFR calc non Af Amer: 6 mL/min — ABNORMAL LOW (ref >60)
Glucose, Bld: 180 mg/dL — ABNORMAL HIGH (ref 70–99)
Potassium: 4.7 mmol/L (ref 3.5–5.1)
Sodium: 144 mmol/L (ref 135–145)
Total Bilirubin: 0.7 mg/dL (ref 0.3–1.2)
Total Protein: 6.3 g/dL — ABNORMAL LOW (ref 6.5–8.1)

## 2019-06-19 LAB — BRAIN NATRIURETIC PEPTIDE: B Natriuretic Peptide: 457.9 pg/mL — ABNORMAL HIGH (ref 0.0–100.0)

## 2019-06-19 LAB — PROTIME-INR
INR: 3.1 — ABNORMAL HIGH (ref 0.8–1.2)
Prothrombin Time: 32.1 seconds — ABNORMAL HIGH (ref 11.4–15.2)

## 2019-06-19 LAB — HIV ANTIBODY (ROUTINE TESTING W REFLEX): HIV Screen 4th Generation wRfx: NONREACTIVE

## 2019-06-19 LAB — MAGNESIUM
Magnesium: 2.6 mg/dL — ABNORMAL HIGH (ref 1.7–2.4)
Magnesium: 2.6 mg/dL — ABNORMAL HIGH (ref 1.7–2.4)

## 2019-06-19 LAB — D-DIMER, QUANTITATIVE: D-Dimer, Quant: 7.6 ug/mL-FEU — ABNORMAL HIGH (ref 0.00–0.50)

## 2019-06-19 LAB — HEPATITIS B SURFACE ANTIGEN: Hepatitis B Surface Ag: NONREACTIVE

## 2019-06-19 LAB — GLUCOSE, CAPILLARY
Glucose-Capillary: 176 mg/dL — ABNORMAL HIGH (ref 70–99)
Glucose-Capillary: 173 mg/dL — ABNORMAL HIGH (ref 70–99)
Glucose-Capillary: 201 mg/dL — ABNORMAL HIGH (ref 70–99)
Glucose-Capillary: 167 mg/dL — ABNORMAL HIGH (ref 70–99)
Glucose-Capillary: 153 mg/dL — ABNORMAL HIGH (ref 70–99)

## 2019-06-19 LAB — HEPATITIS B SURFACE ANTIBODY,QUALITATIVE: Hep B S Ab: NONREACTIVE

## 2019-06-19 LAB — C-REACTIVE PROTEIN: CRP: 13.9 mg/dL — ABNORMAL HIGH (ref <1.0)

## 2019-06-19 LAB — MRSA PCR SCREENING: MRSA by PCR: NEGATIVE

## 2019-06-19 LAB — HEPARIN LEVEL (UNFRACTIONATED): Heparin Unfractionated: 0.81 IU/mL — ABNORMAL HIGH (ref 0.30–0.70)

## 2019-06-19 MED ORDER — SODIUM CHLORIDE 0.9 % FOR CRRT
INTRAVENOUS_CENTRAL | Status: DC | PRN
  Filled 2019-06-19: qty 1000

## 2019-06-19 MED ORDER — HEPARIN SODIUM (PORCINE) 1000 UNIT/ML DIALYSIS: 2500.0000 [IU] | INTRAMUSCULAR | Status: DC | PRN

## 2019-06-19 MED ORDER — WARFARIN SODIUM 1 MG PO TABS
1.0000 mg | ORAL_TABLET | Freq: Once | ORAL | Status: DC
  Filled 2019-06-19: qty 1

## 2019-06-19 MED ORDER — ALTEPLASE 2 MG IJ SOLR: 2.0000 mg | Freq: Once | INTRAMUSCULAR | Status: DC | PRN

## 2019-06-19 MED ORDER — PRO-STAT SUGAR FREE PO LIQD
30.0000 mL | Freq: Every day | ORAL | Status: DC
  Administered 2019-06-25 – 2019-07-01 (×7): 30 mL
  Filled 2019-06-19 (×7): qty 30

## 2019-06-19 MED ORDER — CHLORHEXIDINE GLUCONATE CLOTH 2 % EX PADS
6.0000 | MEDICATED_PAD | Freq: Every day | CUTANEOUS | Status: DC
  Administered 2019-06-19 – 2019-06-24 (×6): 6 via TOPICAL

## 2019-06-19 MED ORDER — HEPARIN SODIUM (PORCINE) 1000 UNIT/ML DIALYSIS
1000.0000 [IU] | INTRAMUSCULAR | Status: DC | PRN
  Administered 2019-06-20: 3200 [IU] via INTRAVENOUS_CENTRAL
  Filled 2019-06-19 (×2): qty 6
  Filled 2019-06-19: qty 3

## 2019-06-19 MED ORDER — VITAL 1.5 CAL PO LIQD
1000.0000 mL | ORAL | Status: DC
  Administered 2019-06-20: 1000 mL
  Filled 2019-06-19 (×5): qty 1000

## 2019-06-19 MED ORDER — DEXAMETHASONE SODIUM PHOSPHATE 10 MG/ML IJ SOLN
6.0000 mg | INTRAMUSCULAR | Status: AC
  Administered 2019-06-19 – 2019-06-21 (×3): 6 mg via INTRAVENOUS
  Filled 2019-06-19 (×2): qty 1

## 2019-06-19 MED ORDER — ORAL CARE MOUTH RINSE
15.0000 mL | Freq: Two times a day (BID) | OROMUCOSAL | Status: DC
  Administered 2019-06-19 – 2019-06-24 (×11): 15 mL via OROMUCOSAL

## 2019-06-19 MED ORDER — CHLORHEXIDINE GLUCONATE 0.12 % MT SOLN
15.0000 mL | Freq: Two times a day (BID) | OROMUCOSAL | Status: DC
  Administered 2019-06-19 – 2019-06-24 (×10): 15 mL via OROMUCOSAL
  Filled 2019-06-19 (×5): qty 15

## 2019-06-19 MED ORDER — PRISMASOL BGK 4/2.5 32-4-2.5 MEQ/L REPLACEMENT SOLN
Status: DC
  Filled 2019-06-19 (×6): qty 5000

## 2019-06-19 MED ORDER — WHITE PETROLATUM EX OINT: TOPICAL_OINTMENT | CUTANEOUS | Status: DC | PRN

## 2019-06-19 MED ORDER — B COMPLEX-C PO TABS
1.0000 | ORAL_TABLET | Freq: Every day | ORAL | Status: DC
  Administered 2019-06-25 – 2019-07-01 (×7): 1
  Filled 2019-06-19 (×9): qty 1

## 2019-06-19 MED ORDER — PRISMASOL BGK 4/2.5 32-4-2.5 MEQ/L REPLACEMENT SOLN
Status: DC
  Filled 2019-06-19 (×3): qty 5000

## 2019-06-19 MED ORDER — PRISMASOL BGK 4/2.5 32-4-2.5 MEQ/L IV SOLN
INTRAVENOUS | Status: DC
  Filled 2019-06-19 (×18): qty 5000

## 2019-06-19 MED ORDER — CALCIUM GLUCONATE-NACL 1-0.675 GM/50ML-% IV SOLN
1.0000 g | Freq: Once | INTRAVENOUS | Status: AC
  Administered 2019-06-19: 20:00:00 1000 mg via INTRAVENOUS
  Filled 2019-06-19: qty 50

## 2019-06-19 MED ORDER — SODIUM CHLORIDE 0.9 % IV SOLN
3.0000 g | Freq: Three times a day (TID) | INTRAVENOUS | Status: DC
  Administered 2019-06-19 – 2019-06-22 (×9): 3 g via INTRAVENOUS
  Filled 2019-06-19 (×9): qty 8

## 2019-06-19 MED ORDER — TOCILIZUMAB 400 MG/20ML IV SOLN
8.0000 mg/kg | Freq: Once | INTRAVENOUS | Status: AC
  Administered 2019-06-19: 570 mg via INTRAVENOUS
  Filled 2019-06-19: qty 20

## 2019-06-19 MED ORDER — SODIUM CHLORIDE 0.9 % IV SOLN: INTRAVENOUS | Status: DC

## 2019-06-19 MED ORDER — "THROMBI-PAD 3""X3"" EX PADS"
1.0000 | MEDICATED_PAD | Freq: Once | CUTANEOUS | Status: AC
  Administered 2019-06-19: 1 via TOPICAL
  Filled 2019-06-19: qty 1

## 2019-06-19 NOTE — Progress Notes (Signed)
Pharmacy Antibiotic Note  Steve Martinez is a 78 y.o. male admitted on 06/10/2019 with pneumonia.  Pharmacy has been consulted for Unasyn dosing.  Pt has been here for COVID. Adding Unasyn due to concern for asp PNA. Pt transferred to [facility] for CRRT - which will be started shortly.   Plan: Change Unasyn to 3 g IV q8h Monitor for changes on RRT F/u length of treatment  Height: 6\' 1"  (185.4 cm) Weight: 156 lb 15.5 oz (71.2 kg) IBW/kg (Calculated) : 79.9  Temp (24hrs), Avg:96.2 F (35.7 C), Min:93.4 F (34.1 C), Max:97.7 F (36.5 C)  Recent Labs  Lab 06/11/2019 1324 06/19/2019 1324 06/11/2019 1904 06/15/19 0158 06/16/19 0432 06/16/19 1315 06/17/19 0400 06/18/19 0520 06/18/19 1945 06/18/19 2132 06/19/19 0409  WBC 8.9   < >  --  14.1* 15.6* 21.4* 19.1* 20.5*  --   --  18.9*  CREATININE 4.67*  --  4.24* 4.47* 4.57*  --  5.26* 6.67*  --   --  7.89*  LATICACIDVEN 2.3*  --  2.2*  --   --   --   --   --  2.2* 1.5  --    < > = values in this interval not displayed.    Estimated Creatinine Clearance: 7.8 mL/min (A) (by C-G formula based on SCr of 7.89 mg/dL (H)).      Steve Martinez 06/19/2019 9:13 AM

## 2019-06-19 NOTE — Progress Notes (Signed)
SLP Cancellation Note  Patient Details Name: Steve Martinez MRN: [id number] DOB: 1940-07-22   Cancelled treatment:       Reason Eval/Treat Not Completed: Medical issues which prohibited therapy. Per PCCM note, pt went unresponsive this morning during dialysis, now more alert but to remain NPO for now. Will f/u for ability to restart POS versus need for instrumental testing.     Osie Bond., M.A. [place] [facility] Pager [phone] Office [phone]  06/19/2019, 10:26 AM

## 2019-06-19 NOTE — Progress Notes (Signed)
Steve Martinez for warfarin Indication: BL acute  DVT  12/24    Patient Measurements: Height: 6\' 1"  (185.4 cm) Weight: 156 lb 15.5 oz (71.2 kg) IBW/kg (Calculated) : 79.9   Vital Signs: Temp: 97.7 F (36.5 C) (12/30 0400) Temp Source: Oral (12/30 0400) BP: 104/57 (12/30 0700) Pulse Rate: 72 (12/30 0700)  Labs: Recent Labs    06/17/19 0400 06/18/19 0520 06/19/19 0409 06/19/19 0704  HGB 13.2 13.0 10.8*  --   HCT 41.7 40.4 33.2*  --   PLT 322 365 311  --   LABPROT 22.8* 27.1* 32.1*  --   INR 2.0* 2.5* 3.1*  --   HEPARINUNFRC 0.20* 0.44  --  0.81*  CREATININE 5.26* 6.67* 7.89*  --      Assessment: 78 year old male admited with respiratory failure due to COVID-PNA on heparin 7500 unit [place] q 8 h for VTE ppx. D-dimer is elevated, 12/24 LE US shows acute DVT involving the R peroneal veins and acute superficial vein thrombosis in the L small saphenous vein.  Pt received heparin / warfarin overlap for 5 days. INR is slightly supratherapeutic at 3.1 today, heparin level is supratherapeutic.   Goal of Therapy:  Heparin level 0.3-0.7 units/ml  INR 2-3 Monitor platelets by anticoagulation protocol: Yes    Plan:  -Discontinue heparin  -Warfarin 1 mg po x1 -Daily PT/INR   Harvel Quale 06/19/2019 8:35 AM

## 2019-06-19 NOTE — Progress Notes (Signed)
Cortrak team came to bedside to place cortrak, which was unsuccessful.   Dr. Chase Caller was notified and placed order for pt. To go to IR to have cortrak placed.  X-Ray called day shift RN and said to let attending MD to call on-call radiologist if it was an urgent matter.   Dr. Chase Caller was notified and stated it can wait until the morning.

## 2019-06-19 NOTE — Progress Notes (Signed)
Physical Therapy Treatment Patient Details Name: Steve Martinez MRN: [id number] DOB: [birth year] Today's Date: 06/19/2019    History of Present Illness 78 y.o male w/ hx of CVA, MI, HTN, HLD, bronchitis, GERD, DM, B cataracts, back pain. Pt also w/ hx of shoulder surgery, refractive surgery abd cardiac cath. pt presented to hospital w/ c/o progressiverly worsening SOB over a week and was dx with acute hypoxic respiratory failure ude to COVID 19 PNA. Pt transferred to [facility] on 12/29 for intermittent HD. Pt became unresponsive during first attempt at HD and now CRRT has been initiated. Pt aslo on NRB.     PT Comments    Pt with medical decline and now on CRRT since last seen by PT. Performed bed level lower extremity ex's today as pt has just begun CRRT. Continue to feel pt will need SNF at dc   Follow Up Recommendations  SNF;Supervision/Assistance - 24 hour     Equipment Recommendations  Rolling walker with 5" wheels    Recommendations for Other Services       Precautions / Restrictions Precautions Precautions: Fall;Other (comment) Precaution Comments: CRRT Restrictions Weight Bearing Restrictions: No    Mobility  Bed Mobility               General bed mobility comments: Not attempted due to just starting CRRT  Transfers                    Ambulation/Gait                 Stairs             Wheelchair Mobility    Modified Rankin (Stroke Patients Only)       Balance                                            Cognition Arousal/Alertness: Awake/alert Behavior During Therapy: Flat affect Overall Cognitive Status: No family/caregiver present to determine baseline cognitive functioning                                 General Comments: Pt with very soft voice. Only minimal verbalizations. Followed 1 step commands      Exercises General Exercises - Lower Extremity Ankle Circles/Pumps: AAROM;Both;10  reps;Supine Quad Sets: AROM;Both;10 reps;Supine Heel Slides: AAROM;Both;10 reps;Supine Hip ABduction/ADduction: AAROM;Both;10 reps;Supine    General Comments General comments (skin integrity, edema, etc.): VSS with bed level ex's. On NRB with SpO2 >90%      Pertinent Vitals/Pain Pain Assessment: No/denies pain    Home Living                      Prior Function            PT Goals (current goals can now be found in the care plan section) Progress towards PT goals: Not progressing toward goals - comment    Frequency    Min 2X/week      PT Plan Current plan remains appropriate;Frequency needs to be updated    Co-evaluation              AM-PAC PT "6 Clicks" Mobility   Outcome Measure  Help needed turning from your back to your side while in a flat bed without using bedrails?: A Lot Help needed moving  from lying on your back to sitting on the side of a flat bed without using bedrails?: A Lot Help needed moving to and from a bed to a chair (including a wheelchair)?: Total Help needed standing up from a chair using your arms (e.g., wheelchair or bedside chair)?: Total Help needed to walk in hospital room?: Total Help needed climbing 3-5 steps with a railing? : Total 6 Click Score: 8    End of Session Equipment Utilized During Treatment: Oxygen Activity Tolerance: Treatment limited secondary to medical complications (Comment) Patient left: with call bell/phone within reach;in bed Nurse Communication: Other (comment)(exercise tolerance) PT Visit Diagnosis: Other abnormalities of gait and mobility (R26.89);Muscle weakness (generalized) (M62.81)     Time: [id number] PT Time Calculation (min) (ACUTE ONLY): 10 min  Charges:  $Therapeutic Exercise: 8-22 mins                     [place] Pager [phone] Office [place] 06/19/2019, 11:48 AM

## 2019-06-19 NOTE — Progress Notes (Signed)
Patient transferred to 3M11 via bed in stable condition.  Report given to [employer]

## 2019-06-19 NOTE — Progress Notes (Signed)
[facility] Progress Note  Subjective: attempted HD , patient passed out early lin HD this am so HD was aborted.  Plan now is for CRRT     Vitals:   06/19/19 0730 06/19/19 0812 06/19/19 0858 06/19/19 0900  BP: (!) 104/57 (!) 109/52 102/65 (!) 109/56  Pulse: 73 74 74 71  Resp: (!) 28  (!) 25 (!) 25  Temp:      TempSrc: Axillary  Axillary   SpO2: 97%  97% 96%  Weight: 71.2 kg     Height:        Exam:  Patient not examined today directly given COVID-19 + status, utilizing exam of the primary team and observations of RN's.      Home meds:  - amlodipine 10/ carvedilol 6.25 bid/ furosemide 40 qd  - rosuvastatin 40/ aspirin 325  - novolog 70/30 20-30 bid     UA negative   Renal US - 9cm/ 10 cm kidneys w/o hydro, ^'d echo  Assessment/ Plan: 1. AoCKD - get OP records, pt is f/b [facility] for CKD. Admitted 12/24 w/ creat 3.6 which worsened up to 6-7 range. UA and renal US were negative and pt euvolemic on last exam. Pt tx'd to [facility] and TDC placed yest by IR. Did not tolerate HD this am however, so plan now is for CRRT to start today. Prognosis guarded given age and comorbidities.  2. DM2 3. HTN 4. COVID+ PNA   Rob Gurman [place] 06/19/2019, 10:20 AM  Inpatient medications: . albuterol  2 puff Inhalation Q6H  . vitamin C  500 mg Oral Daily  . Chlorhexidine Gluconate Cloth  6 each Topical Q0600  . dexamethasone (DECADRON) injection  8 mg Intravenous Q24H  . insulin aspart  0-9 Units Subcutaneous Q4H  . rosuvastatin  40 mg Oral Daily  . sodium chloride flush  3 mL Intravenous Q12H  . warfarin  1 mg Oral ONCE-1800  . Warfarin - Pharmacist Dosing Inpatient   Does not apply q1800  . zinc sulfate  220 mg Oral Daily   .  prismasol BGK 4/2.5 400 mL/hr at 06/19/19 1018  .  prismasol BGK 4/2.5 200 mL/hr at 06/19/19 1018  . sodium chloride    . ampicillin-sulbactam (UNASYN) IV    . famotidine (PEPCID) IV 20 mg (06/18/19 0905)  . prismasol BGK 4/2.5 1,800 mL/hr at  06/19/19 1018   acetaminophen, alteplase, chlorpheniramine-HYDROcodone, dextrose **OR** dextrose, guaiFENesin-dextromethorphan, heparin, heparin, lidocaine, sodium chloride

## 2019-06-19 NOTE — Progress Notes (Signed)
 Initial Nutrition Assessment  DOCUMENTATION CODES:   Not applicable  INTERVENTION:   Tube Feeding:  Begin Vital 1.5 at 20 ml/hr Goal rate: Vital 1.5 at 55 ml/hr Pro-Stat 30 mL daily Provides 2080 kcals, 104 g of protein and 1003 mL of free water  Add B-complex with C   NUTRITION DIAGNOSIS:   Inadequate oral intake related to acute illness, dysphagia as evidenced by NPO status.  GOAL:   Patient will meet greater than or equal to 90% of their needs  MONITOR:   TF tolerance, Labs, Weight trends, Diet advancement  REASON FOR ASSESSMENT:   Rounds    ASSESSMENT:   78 yo male admitted with COVID-19 pneumonia, ARF on CKD requiring RRT, hyperglycemia. PMH includes HTN, HLD, DM, GERD, CVA  RD working remotely.  12/23 Admit to [facility] 12/27 Possible aspiration, SLP re-consulted 12/28 Worsening AKI 12/29 Transferred to [facility] for HD, Tunneled HD cath 12/30 Attempted HD not tolerated, initiated CRRT  Currently on HFNC, Remains on CRRT NPO post aspiration vent, remains NPO per SLP  Per chart review, pt with loss of appetite and poor po intake with generalized weakness prior to admission  Abd xray with mild ileus yesterday Noted order for Cortrak; discussed nutrition plan with CCM and received orders to start trickle TF  Recorded po intake 25-100% of meals; recorded po intake 70% of meals  Current wt 71.2 kg  Unable to obtain diet and weight history  Labs: CBGs 173-176 (ICU goal 140-180), BUN 135, Creatinine 7.89 Meds: NS at 50 ml/hr   Diet Order:   Diet Order            Diet NPO time specified  Diet effective now              EDUCATION NEEDS:   Not appropriate for education at this time  Skin:  Skin Assessment: Reviewed RN Assessment  Last BM:  12/27  Height:   Ht Readings from Last 1 Encounters:  05/31/2019 6\' 1"  (1.854 m)    Weight:   Wt Readings from Last 1 Encounters:  06/19/19 71.2 kg    Ideal Body Weight:     BMI:  Body  mass index is 20.71 kg/m.  Estimated Nutritional Needs:   Kcal:  2000-2200 kcals  Protein:  100-115 g  Fluid:  >/= 2 L    Amilliana Hayworth MS, RDN, LDN, CNSC [phone] Pager  [phone] Weekend/On-Call Pager

## 2019-06-19 NOTE — Progress Notes (Signed)
Cortrak Tube Team Note:  Consult received to place a Cortrak feeding tube.   RD unable to advance tube past LES. Recommend fluroscopy guided nasogastric tube placement. Pt tolerated procedure well.   Koleen Distance MS, RD, LDN Pager #- [phone] Office#- [phone] After Hours Pager: [phone]

## 2019-06-19 NOTE — Progress Notes (Signed)
Pt. Was receiving dialysis for approx. 10 min. The pt. Was unarousable  via stimulation.   Dr. Shearon Stalls came to bedside to stimulate pt. And dialysis RN turned dialysis off. The pt. Then became aroused after a few minutes of dialysis being turned off.   Dr. Jonnie Finner was notified and CRRT orders were put in.

## 2019-06-19 NOTE — Progress Notes (Signed)
CRITICAL VALUE ALERT  Critical Value:  Calcium 6.3  Date & Time Notied:  1900  Provider Notified: Ramaswamy  Orders Received/Actions taken:  1g of Ca Gluconate ordered

## 2019-06-19 NOTE — Progress Notes (Addendum)
NAME:  Steve Martinez, MRN:  [id number], DOB:  Jul 31, 1940, LOS: 7 ADMISSION DATE:  05/30/2019, CONSULTATION DATE:  06/18/2019 REFERRING MD:  Dr. Candiss Norse, CHIEF COMPLAINT:  Hypotension  Brief History   78 year old male admitted 12/23 with progressively worse SOB found to have hypoxic respiratory failure related to COVID-19. Initially admitted to [facility] but course complicated by probable aspiration and worsening AKI requiring transfer to [facility] for iHD. PCCM consulted 12/29 for worsening hypotension.   History of present illness   78 year old male with prior history of HTN, HLD, DMT2, GERD, CKD, CVA with left sided deficits/weakness admitted 12/23 to [facility] after progressive one weak history of worsening shortness of breath found to be in acute hypoxic respiratory failure and COVID- 19 positive after known exposure to wife with COVID.   He was admitted to [facility] and started on oxygen [place], decadron, and remdesivir with aggressive pulmonary hygiene and self proning.  Hospitalization complicated by aspiration event on 12/27 and started on unasyn, acute bilateral leg DVT started on heparin gtt, and worsening AKI with nephrology consulted 12/28.  Patient was transferred to [facility] on 12/29 and underwent placement of tunneled hemodialysis catheter with plans for intermittent dialysis. However, blood pressure continued to decline with concerns for worsening sepsis.  PCCM consulted for closer monitoring in ICU.   Past Medical History  HTN, HLD, DMT2, GERD, CVA with left sided deficits/weakness, MI, CKD  Significant Hospital Events   12/23 Ad,itted [facility] 12/29 transfer [facility]/ TDC placement  Consults:  Nephrology 12/28 PCCM 12/29  Procedures:  12/29 Right TDC >>  Significant Diagnostic Tests:  12/24 DVT BLE [place] >> Right: Findings consistent with acute deep vein thrombosis involving the right peroneal veins. No cystic structure found in the popliteal fossa. Left: Findings consistent with acute superficial vein thrombosis  involving the left small saphenous vein. No cystic structure found in the popliteal fossa.  12/26 renal US >> No acute renal findings and negative urinary bladder. Chronic medical renal disease suspected.  12/27 TTE >>  1. Left ventricular ejection fraction, by visual estimation, is >75%. The left ventricle has hyperdynamic function. There is no left ventricular hypertrophy.  2. Left ventricular diastolic parameters are normal for age. The "impaired relaxation" pattern of mitral inflow is probably due to low left atrial pressures ("underfilling") and increased cardiac output.  3. The left ventricle has no regional wall motion abnormalities.  4. Global right ventricle has hyperdynamic systolic function.The right ventricular size is normal. No increase in right ventricular wall thickness.  5. Left atrial size was normal.  6. Right atrial size was normal.  7. The mitral valve is normal in structure. No evidence of mitral valve regurgitation.  8. The tricuspid valve is normal in structure.  9. The aortic valve is normal in structure. Aortic valve regurgitation is not visualized. Mild aortic valve sclerosis without stenosis. 10. The pulmonic valve was grossly normal. Pulmonic valve regurgitation is not visualized. 11. Normal pulmonary artery systolic pressure. 12. The inferior vena cava is collapsed, consistent with low right atrial pressure.  Micro Data:  12/23 SARS Coronavirus 2 POSITIVE 12/23 Blood Negative 12/23 Urine multiple species  Antimicrobials:  Amp/Sulbactam 12/27>> Ceftriaxone 12/23 x 1 dose Doxycycline 12/23 x 1 dose Remdesivir 12/23>>12/27 Decadron 12/23>>  Interim history/subjective:  Did not tolerated iHD this AM. After 11 minutes became unresponsive. HD stopped.  Required sternal rub and noxious stimuli before mental status returned.  Followed commands thereafter. Was protecting airway throughout with strong gag.  Objective  Blood pressure (!) 104/57, pulse 72,  temperature 97.7 F (36.5 C), temperature source Oral, resp. rate (!) 26, height 6\' 1"  (1.854 m), weight 71.2 kg, SpO2 95 %.    FiO2 (%):  [100 %] 100 %   Intake/Output Summary (Last 24 hours) at 06/19/2019 0845 Last data filed at 06/19/2019 0700 Gross per 24 hour  Intake 151.42 ml  Output 200 ml  Net -48.58 ml   Filed Weights   06/14/2019 1258 06/19/19 0449  Weight: 77.1 kg 71.2 kg    Examination: General: Adult male, chronically ill appearing, resting in bed, in NAD. Neuro: Became altered after iHD started.  Mental status improved shortly after stopping HD. HEENT: [place]/AT. Sclerae anicteric. EOMI. Cardiovascular: RRR, no M/R/G.  Lungs: Respirations even and unlabored.  CTA bilaterally, No W/R/R. Abdomen: BS x 4, soft, NT/ND.  Musculoskeletal: No gross deformities, no edema.  Skin: Intact, warm, no rashes.   Assessment & Plan:   Acute hypoxic respiratory failure 2/2 COVID 19 Pneumonitis c/b aspiration pneumonia with possible sepsis. Procal elevated at 2.19.  Unasyn initiated by [facility]. Has completed remdesivir. Remains on decadron.  Plan: Continue NRB for goal SPO2 >/= 88% Self prone as able Continue decadron, abx, Vitamin C, Zinc  Continue empiric unasyn  Hypotension - improved from transfer but now borderline soft after iHD started. 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 SLP following. On Dysphagia 3 diet. Plan was for MBS when able and NPO if resp distress Plan: NPO for now Cortrak today as below  B/L DVT on coumadin with therapeutic INR.  Plan: On heparin overlap day 5/5 per pharm notes. INR therapeutic (3.1) and can stop heparin gtt today  DM II. A1c 9.6 06/13/2019. On steroids Plan: Change SSI to Q4H D/c novolog for now while NPO and not on full tube feeds (was on 20u BID  AC)  Ileus Plan: Supportive care Maintain NPO for now Will get cortrak today given availability of staff. Consider trickle feeds to begin and see if tolerates  Best practice:  Diet: NPO.  Cortrak today, consider trickle feeds  Pain/Anxiety/Delirium protocol (if indicated): n/a VAP protocol (if indicated): n/a DVT prophylaxis: Warfarin GI prophylaxis: PPI Glucose control: SSI Mobility: BR Code Status: Full  Family Communication: Will call this afternoon Disposition: ICU   CC time: 45 min.   Montey Hora, [place] [facility] 06/19/2019, 9:06 AM

## 2019-06-20 ENCOUNTER — Inpatient Hospital Stay (HOSPITAL_COMMUNITY): Payer: Medicare HMO

## 2019-06-20 DIAGNOSIS — J9621 Acute and chronic respiratory failure with hypoxia: Secondary | ICD-10-CM

## 2019-06-20 LAB — COMPREHENSIVE METABOLIC PANEL
ALT: 26 U/L (ref 0–44)
AST: 64 U/L — ABNORMAL HIGH (ref 15–41)
Albumin: 1.6 g/dL — ABNORMAL LOW (ref 3.5–5.0)
Alkaline Phosphatase: 187 U/L — ABNORMAL HIGH (ref 38–126)
Anion gap: 14 (ref 5–15)
BUN: 65 mg/dL — ABNORMAL HIGH (ref 8–23)
CO2: 22 mmol/L (ref 22–32)
Calcium: 6.9 mg/dL — ABNORMAL LOW (ref 8.9–10.3)
Chloride: 106 mmol/L (ref 98–111)
Creatinine, Ser: 4 mg/dL — ABNORMAL HIGH (ref 0.61–1.24)
GFR calc Af Amer: 16 mL/min — ABNORMAL LOW (ref >60)
GFR calc non Af Amer: 13 mL/min — ABNORMAL LOW (ref >60)
Glucose, Bld: 159 mg/dL — ABNORMAL HIGH (ref 70–99)
Potassium: 4.7 mmol/L (ref 3.5–5.1)
Sodium: 142 mmol/L (ref 135–145)
Total Bilirubin: 0.9 mg/dL (ref 0.3–1.2)
Total Protein: 6.7 g/dL (ref 6.5–8.1)

## 2019-06-20 LAB — RENAL FUNCTION PANEL
Albumin: 1.5 g/dL — ABNORMAL LOW (ref 3.5–5.0)
Anion gap: 17 — ABNORMAL HIGH (ref 5–15)
BUN: 62 mg/dL — ABNORMAL HIGH (ref 8–23)
CO2: 19 mmol/L — ABNORMAL LOW (ref 22–32)
Calcium: 6.8 mg/dL — ABNORMAL LOW (ref 8.9–10.3)
Chloride: 104 mmol/L (ref 98–111)
Creatinine, Ser: 3.85 mg/dL — ABNORMAL HIGH (ref 0.61–1.24)
GFR calc Af Amer: 16 mL/min — ABNORMAL LOW (ref >60)
GFR calc non Af Amer: 14 mL/min — ABNORMAL LOW (ref >60)
Glucose, Bld: 242 mg/dL — ABNORMAL HIGH (ref 70–99)
Phosphorus: 5.9 mg/dL — ABNORMAL HIGH (ref 2.5–4.6)
Potassium: 5.6 mmol/L — ABNORMAL HIGH (ref 3.5–5.1)
Sodium: 140 mmol/L (ref 135–145)

## 2019-06-20 LAB — C-REACTIVE PROTEIN: CRP: 12 mg/dL — ABNORMAL HIGH (ref <1.0)

## 2019-06-20 LAB — D-DIMER, QUANTITATIVE: D-Dimer, Quant: 12.3 ug/mL-FEU — ABNORMAL HIGH (ref 0.00–0.50)

## 2019-06-20 LAB — GLUCOSE, CAPILLARY
Glucose-Capillary: 153 mg/dL — ABNORMAL HIGH (ref 70–99)
Glucose-Capillary: 159 mg/dL — ABNORMAL HIGH (ref 70–99)
Glucose-Capillary: 171 mg/dL — ABNORMAL HIGH (ref 70–99)
Glucose-Capillary: 187 mg/dL — ABNORMAL HIGH (ref 70–99)
Glucose-Capillary: 194 mg/dL — ABNORMAL HIGH (ref 70–99)
Glucose-Capillary: 219 mg/dL — ABNORMAL HIGH (ref 70–99)
Glucose-Capillary: 239 mg/dL — ABNORMAL HIGH (ref 70–99)

## 2019-06-20 LAB — PROTIME-INR
INR: 2.6 — ABNORMAL HIGH (ref 0.8–1.2)
Prothrombin Time: 28 seconds — ABNORMAL HIGH (ref 11.4–15.2)

## 2019-06-20 LAB — CBC
HCT: 31.4 % — ABNORMAL LOW (ref 39.0–52.0)
Hemoglobin: 10.2 g/dL — ABNORMAL LOW (ref 13.0–17.0)
MCH: 25.4 pg — ABNORMAL LOW (ref 26.0–34.0)
MCHC: 32.5 g/dL (ref 30.0–36.0)
MCV: 78.3 fL — ABNORMAL LOW (ref 80.0–100.0)
Platelets: 313 10*3/uL (ref 150–400)
RBC: 4.01 MIL/uL — ABNORMAL LOW (ref 4.22–5.81)
RDW: 15.9 % — ABNORMAL HIGH (ref 11.5–15.5)
WBC: 15.2 10*3/uL — ABNORMAL HIGH (ref 4.0–10.5)
nRBC: 0 % (ref 0.0–0.2)

## 2019-06-20 LAB — BRAIN NATRIURETIC PEPTIDE: B Natriuretic Peptide: 298.1 pg/mL — ABNORMAL HIGH (ref 0.0–100.0)

## 2019-06-20 LAB — MAGNESIUM
Magnesium: 2.6 mg/dL — ABNORMAL HIGH (ref 1.7–2.4)
Magnesium: 2.7 mg/dL — ABNORMAL HIGH (ref 1.7–2.4)

## 2019-06-20 LAB — PHOSPHORUS: Phosphorus: 4.4 mg/dL (ref 2.5–4.6)

## 2019-06-20 MED ORDER — LIDOCAINE VISCOUS HCL 2 % MT SOLN
6.0000 mL | Freq: Once | OROMUCOSAL | Status: AC
  Administered 2019-06-20: 6 mL via OROMUCOSAL

## 2019-06-20 MED ORDER — "THROMBI-PAD 3""X3"" EX PADS"
1.0000 | MEDICATED_PAD | Freq: Once | CUTANEOUS | Status: AC
  Administered 2019-06-20: 1 via TOPICAL
  Filled 2019-06-20: qty 1

## 2019-06-20 MED ORDER — LIDOCAINE HCL 1 % IJ SOLN
INTRAMUSCULAR | Status: AC
  Filled 2019-06-20: qty 20

## 2019-06-20 MED ORDER — PRISMASOL BGK 0/2.5 32-2.5 MEQ/L IV SOLN
INTRAVENOUS | Status: DC
  Filled 2019-06-20 (×16): qty 5000

## 2019-06-20 MED ORDER — IOHEXOL 300 MG/ML  SOLN
10.0000 mL | Freq: Once | INTRAMUSCULAR | Status: AC | PRN
  Administered 2019-06-20: 10 mL

## 2019-06-20 MED ORDER — CALCIUM GLUCONATE-NACL 1-0.675 GM/50ML-% IV SOLN
1.0000 g | Freq: Once | INTRAVENOUS | Status: AC
  Administered 2019-06-20: 1000 mg via INTRAVENOUS
  Filled 2019-06-20: qty 50

## 2019-06-20 MED ORDER — HEPARIN SODIUM (PORCINE) 1000 UNIT/ML DIALYSIS
1000.0000 [IU] | INTRAMUSCULAR | Status: DC | PRN
  Filled 2019-06-20: qty 6

## 2019-06-20 NOTE — Progress Notes (Signed)
[place] Progress Note Patient Name: Mori Husein DOB: [birth year] MRN: [id number]   Date of Service  06/20/2019  HPI/Events of Note  Pt oozing blood from around his dialysis catheter access site,  eICU Interventions  Thrombi pad ordered.        Kerry Kass Saquan Furtick 06/20/2019, 4:11 AM

## 2019-06-20 NOTE — Progress Notes (Signed)
ANTICOAGULATION CONSULT NOTE - Follow Up Consult  Pharmacy Consult for Coumadin Indication: DVT  No Known Allergies  Patient Measurements: Height: 6\' 1"  (185.4 cm) Weight: 158 lb 15.2 oz (72.1 kg) IBW/kg (Calculated) : 79.9   Vital Signs: Temp: 97.4 F (36.3 C) (12/31 0800) Temp Source: Oral (12/31 0800) BP: 120/61 (12/31 1000) Pulse Rate: 87 (12/31 1000)  Labs: Recent Labs    06/18/19 0520 06/19/19 0409 06/19/19 0704 06/19/19 1802 06/20/19 0453  HGB 13.0 10.8*  --   --  10.2*  HCT 40.4 33.2*  --   --  31.4*  PLT 365 311  --   --  313  LABPROT 27.1* 32.1*  --   --  28.0*  INR 2.5* 3.1*  --   --  2.6*  HEPARINUNFRC 0.44  --  0.81*  --   --   CREATININE 6.67* 7.89*  --  5.65* 4.00*    Estimated Creatinine Clearance: 15.5 mL/min (A) (by C-G formula based on SCr of 4 mg/dL (H)).  Assessment:  Anticoag: heparin gtt off, INR 2.6.  D-dimer > 20>>now 12.3, acute DVT.  Coumadin chosen in CKD. Hgb 10.8>10.2. Plts 313. Coumadin not charted 12/30 PM. Oozing from dialysis catheter site. CCM PA notified.  Goal of Therapy:  INR 2-3 Monitor platelets by anticoagulation protocol: Yes   Plan:  PA would like to hold Coumadin today and reassess tomorrow.  Will need heparin again when/if INR drops <2.   Delancey Moraes S. Alford Highland, PharmD, BCPS Clinical Staff Pharmacist [email or web address] Alford Highland, [employer] 06/20/2019,10:17 AM

## 2019-06-20 NOTE — Progress Notes (Signed)
Pt oozing blood from his hemodialysis catheter, which has increased throughout the night.  Applied pressure and applied changed dressing with no change. Notified physician and Thrombi pad order. Called lab to draw CBC early. Vitals stable.

## 2019-06-20 NOTE — Progress Notes (Signed)
Request to IR for assessment of "oozing" HD catheter. Of note patient is Covid+, on Warfarin with last dose today at 0600, INR 2.6 and heparin was used in IJ at 1316 today.   Patient seen on floor, undergoing CRRT during exam. Thrombipad and clear dressing over insertion site which are both blood soaked - both were removed as well as small clot around insertion site. Site was watched for 3 minutes and insertion site was thoroughly palpated with no further oozing of blood was noted. Discussed placing additional suture around catheter to which patient declined and stated "don't touch me."  Site was cleaned and dressed with quickclot pad + clear dressing without noticeable oozing.  Quickclot pad needs to remain in place for 24H, if the catheter continues to ooze please hold pressure for 15 minutes at right IJ as well as insertion site. It is unlikely that the catheter will have zero oozing at this time due to patient's INR, recent Warfarin use and heparin use in catheter today - an additional suture may be somewhat helpful however the patient refused any suturing today. Could also consider DDAVP if oozing continues to be worrisome.   I will call the floor tomorrow to assess need for further intervention.   Candiss Norse, PA-C

## 2019-06-20 NOTE — Progress Notes (Signed)
NAME:  Steve Martinez, MRN:  [id number], DOB:  1940-09-22, LOS: 8 ADMISSION DATE:  06/18/2019, CONSULTATION DATE:  06/18/2019 REFERRING MD:  Dr. Candiss Norse, CHIEF COMPLAINT:  Hypotension  Brief History   78 year old male admitted 12/23 with progressively worse SOB found to have hypoxic respiratory failure related to COVID-19. Initially admitted to [facility] but course complicated by probable aspiration and worsening AKI requiring transfer to [facility] for iHD. PCCM consulted 12/29 for worsening hypotension.   History of present illness   78 year old male with prior history of HTN, HLD, DMT2, GERD, CKD, CVA with left sided deficits/weakness admitted 12/23 to [facility] after progressive one weak history of worsening shortness of breath found to be in acute hypoxic respiratory failure and COVID- 19 positive after known exposure to wife with COVID.   He was admitted to [facility] and started on oxygen [place], decadron, and remdesivir with aggressive pulmonary hygiene and self proning.  Hospitalization complicated by aspiration event on 12/27 and started on unasyn, acute bilateral leg DVT started on heparin gtt, and worsening AKI with nephrology consulted 12/28.  Patient was transferred to [facility] on 12/29 and underwent placement of tunneled hemodialysis catheter with plans for intermittent dialysis. However, blood pressure continued to decline with concerns for worsening sepsis.  PCCM consulted for closer monitoring in ICU.   Past Medical History  HTN, HLD, DMT2, GERD, CVA with left sided deficits/weakness, MI, CKD  Significant Hospital Events   12/23 Ad,itted [facility] 12/29 transfer [facility]/ TDC placement 12/30 did not tolerated HD (became unresponsive), started on CRRT, tocilizumab x 1  Consults:  Nephrology 12/28 PCCM 12/29  Procedures:  12/29 Right TDC >>  Significant Diagnostic Tests:  12/24 DVT BLE [place] >> Right: Findings consistent with acute deep vein thrombosis involving the right peroneal veins. No cystic structure found in the  popliteal fossa. Left: Findings consistent with acute superficial vein thrombosis involving the left small saphenous vein. No cystic structure found in the popliteal fossa.  12/26 renal US >> No acute renal findings and negative urinary bladder. Chronic medical renal disease suspected.  12/27 TTE >>  1. Left ventricular ejection fraction, by visual estimation, is >75%. The left ventricle has hyperdynamic function. There is no left ventricular hypertrophy.  2. Left ventricular diastolic parameters are normal for age. The "impaired relaxation" pattern of mitral inflow is probably due to low left atrial pressures ("underfilling") and increased cardiac output.  3. The left ventricle has no regional wall motion abnormalities.  4. Global right ventricle has hyperdynamic systolic function.The right ventricular size is normal. No increase in right ventricular wall thickness.  5. Left atrial size was normal.  6. Right atrial size was normal.  7. The mitral valve is normal in structure. No evidence of mitral valve regurgitation.  8. The tricuspid valve is normal in structure.  9. The aortic valve is normal in structure. Aortic valve regurgitation is not visualized. Mild aortic valve sclerosis without stenosis. 10. The pulmonic valve was grossly normal. Pulmonic valve regurgitation is not visualized. 11. Normal pulmonary artery systolic pressure. 12. The inferior vena cava is collapsed, consistent with low right atrial pressure.  Micro Data:  12/23 SARS Coronavirus 2 POSITIVE 12/23 Blood Negative 12/23 Urine multiple species  Antimicrobials:  Amp/Sulbactam 12/27>> Ceftriaxone 12/23 x 1 dose Doxycycline 12/23 x 1 dose Remdesivir 12/23>>12/27 Decadron 12/23>>  Interim history/subjective:  Tolerating CRRT well Cortrak not able to be placed 12/30.  IR order placed.  Objective   Blood pressure 124/60, pulse 85, temperature (!)  97.4 F (36.3 C), temperature source Oral, resp. rate (!) 29, height  6\' 1"  (1.854 m), weight 72.1 kg, SpO2 100 %.        Intake/Output Summary (Last 24 hours) at 06/20/2019 0837 Last data filed at 06/20/2019 0800 Gross per 24 hour  Intake 1315.67 ml  Output 219 ml  Net 1096.67 ml   Filed Weights   06/19/19 0730 06/19/19 0858 06/20/19 0352  Weight: 71.2 kg 71.2 kg 72.1 kg    Examination: General: Adult male, chronically ill appearing, resting in bed, in NAD. Neuro: A&O x 3, no deficits. HEENT: [place]/AT. Sclerae anicteric. EOMI. Cardiovascular: RRR, no M/R/G.  Lungs: Respirations even and unlabored.  CTA bilaterally, No W/R/R. Abdomen: BS x 4, soft, NT/ND.  Musculoskeletal: No gross deformities, no edema.  Skin: Intact, warm, no rashes.   Assessment & Plan:   Acute hypoxic respiratory failure 2/2 COVID 19 Pneumonitis c/b aspiration pneumonia with possible sepsis. Procal elevated at 2.19.  Unasyn initiated by [facility]. Has completed remdesivir. Remains on decadron. Received Actemra x 1 on 12/30 Plan: Continue NRB for goal SPO2 >/= 88% Self prone as able (unable to do so with CRRT) Continue decadron, abx, Vitamin C, Zinc  Continue empiric unasyn Volume removal per CRRT (currently + 8.5L net)  Hypotension - resolved. 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 Plan: 1g Ca gluconate  Dysphagia. SLP following. On Dysphagia 3 diet. Plan was for MBS when able and NPO if resp distress Plan: NPO for now Cortrak under flouro (unable to place at bedside)  B/L DVT on coumadin with therapeutic INR.  Plan: Continue coumadin  DM II. A1c 9.6 06/13/2019. On steroids Plan: Change SSI to Q4H Hold meal coverage while NPO (was on 20u BID  AC)  Ileus Plan: Supportive care Maintain NPO for now Once cortrak placed, consider trickle feeds and see if tolerates  Best practice:  Diet: NPO.  Cortrak today, consider trickle feeds  Pain/Anxiety/Delirium protocol (if indicated): n/a VAP protocol (if indicated): n/a DVT prophylaxis: Warfarin GI prophylaxis: PPI Glucose control: SSI Mobility: BR Code Status: Full  Family Communication: Will call this afternoon Disposition: ICU   CC time: 30 min.   Montey Hora, [place] [facility] 06/20/2019, 8:37 AM

## 2019-06-20 NOTE — Progress Notes (Signed)
Steve Martinez Progress Note    Assessment/ Plan:   1. AoCKD - get OP records, pt is f/b CKA for CKD. Admitted 12/24 w/ creat 3.6 which worsened up to 6-7 range. UA and renal US were negative and pt euvolemic on last exam. On CRRT--> will try to pull a little more fluid today. 2. DM2 3. HTN 4. COVID+ PNA  Subjective:    Continues on CRRT.  Had some HD catheter oozing overnight, better with thrombipad.   Objective:   BP 120/61   Pulse 87   Temp (!) 97.4 F (36.3 C) (Oral)   Resp (!) 25   Ht '6\' 1"'  (1.854 m)   Wt 72.1 kg   SpO2 97%   BMI 20.97 kg/m   Intake/Output Summary (Last 24 hours) at 06/20/2019 1040 Last data filed at 06/20/2019 1000 Gross per 24 hour  Intake 1403.15 ml  Output 150 ml  Net 1253.15 ml   Weight change: 0 kg  Physical Exam: Patient not examined today directly given COVID-19 + status, utilizing exam of the primary team and observations of RN's.   Imaging: DG Abd 1 View  Result Date: 06/18/2019 CLINICAL DATA:  Gas below syndrome. COVID-19 virus infection. EXAM: ABDOMEN - 1 VIEW COMPARISON:  None. FINDINGS: Mildly dilated small bowel loops are seen. Gas and stool is also seen throughout nondilated colon. These findings are suggestive of a mild ileus. No radiopaque calculi identified. IMPRESSION: Findings most consistent with mild adynamic ileus. Electronically Signed   By: [name] M.D.   On: 06/18/2019 19:04   IR Fluoro Guide CV Line Right  Result Date: 06/18/2019 INDICATION: End-stage renal disease. In need durable intravenous access for the initiation of dialysis. Patient is COVID positive. EXAM: TUNNELED CENTRAL VENOUS HEMODIALYSIS CATHETER PLACEMENT WITH ULTRASOUND AND FLUOROSCOPIC GUIDANCE MEDICATIONS: Patient is currently admitted to the hospital receiving intravenous antibiotics. The antibiotic was given in an appropriate time interval prior to skin puncture. ANESTHESIA/SEDATION: None FLUOROSCOPY TIME:  36 seconds (5.6 mGy)  COMPLICATIONS: None immediate. PROCEDURE: Informed written consent was obtained from the patient after a discussion of the risks, benefits, and alternatives to treatment. Questions regarding the procedure were encouraged and answered. The right neck and chest were prepped with chlorhexidine in a sterile fashion, and a sterile drape was applied covering the operative field. Maximum barrier sterile technique with sterile gowns and gloves were used for the procedure. A timeout was performed prior to the initiation of the procedure. After creating a small venotomy incision, a micropuncture kit was utilized to access the internal jugular vein. Real-time ultrasound guidance was utilized for vascular access including the acquisition of a permanent ultrasound image documenting patency of the accessed vessel. The microwire was utilized to measure appropriate catheter length. A stiff Glidewire was advanced to the level of the IVC and the micropuncture sheath was exchanged for a peel-away sheath. A palindrome tunneled hemodialysis catheter measuring 19 cm from tip to cuff was tunneled in a retrograde fashion from the anterior chest wall to the venotomy incision. The catheter was then placed through the peel-away sheath with tips ultimately positioned within the superior aspect of the right atrium. Final catheter positioning was confirmed and documented with a spot radiographic image. The catheter aspirates and flushes normally. The catheter was flushed with appropriate volume heparin dwells. The catheter exit site was secured with a 0-Prolene retention suture. The venotomy incision was closed with Dermabond and Steri-strips. Dressings were applied. The patient tolerated the procedure well without immediate post procedural  complication. IMPRESSION: Successful placement of 19 cm tip to cuff tunneled hemodialysis catheter via the right internal jugular vein with tips terminating within the superior aspect of the right atrium. The  catheter is ready for immediate use. Electronically Signed   By: [name] M.D.   On: 06/18/2019 17:48   IR US Guide Vasc Access Right  Result Date: 06/18/2019 INDICATION: End-stage renal disease. In need durable intravenous access for the initiation of dialysis. Patient is COVID positive. EXAM: TUNNELED CENTRAL VENOUS HEMODIALYSIS CATHETER PLACEMENT WITH ULTRASOUND AND FLUOROSCOPIC GUIDANCE MEDICATIONS: Patient is currently admitted to the hospital receiving intravenous antibiotics. The antibiotic was given in an appropriate time interval prior to skin puncture. ANESTHESIA/SEDATION: None FLUOROSCOPY TIME:  36 seconds (5.6 mGy) COMPLICATIONS: None immediate. PROCEDURE: Informed written consent was obtained from the patient after a discussion of the risks, benefits, and alternatives to treatment. Questions regarding the procedure were encouraged and answered. The right neck and chest were prepped with chlorhexidine in a sterile fashion, and a sterile drape was applied covering the operative field. Maximum barrier sterile technique with sterile gowns and gloves were used for the procedure. A timeout was performed prior to the initiation of the procedure. After creating a small venotomy incision, a micropuncture kit was utilized to access the internal jugular vein. Real-time ultrasound guidance was utilized for vascular access including the acquisition of a permanent ultrasound image documenting patency of the accessed vessel. The microwire was utilized to measure appropriate catheter length. A stiff Glidewire was advanced to the level of the IVC and the micropuncture sheath was exchanged for a peel-away sheath. A palindrome tunneled hemodialysis catheter measuring 19 cm from tip to cuff was tunneled in a retrograde fashion from the anterior chest wall to the venotomy incision. The catheter was then placed through the peel-away sheath with tips ultimately positioned within the superior aspect of the right  atrium. Final catheter positioning was confirmed and documented with a spot radiographic image. The catheter aspirates and flushes normally. The catheter was flushed with appropriate volume heparin dwells. The catheter exit site was secured with a 0-Prolene retention suture. The venotomy incision was closed with Dermabond and Steri-strips. Dressings were applied. The patient tolerated the procedure well without immediate post procedural complication. IMPRESSION: Successful placement of 19 cm tip to cuff tunneled hemodialysis catheter via the right internal jugular vein with tips terminating within the superior aspect of the right atrium. The catheter is ready for immediate use. Electronically Signed   By: [name] M.D.   On: 06/18/2019 17:48    Labs: BMET Recent Labs  Lab 06/15/19 0158 06/16/19 0432 06/17/19 0400 06/18/19 0520 06/19/19 0409 06/19/19 1802 06/20/19 0453  NA 142 140 145 137 144 142 142  K 3.9 3.9 4.2 3.7 4.7 4.5 4.7  CL 108 110 110 102 109 107 106  CO2 19* 20* 20* 17* 17* 18* 22  GLUCOSE 120* 168* 210* 124* 180* 161* 159*  BUN 68* 73* 88* 106* 135* 97* 65*  CREATININE 4.47* 4.57* 5.26* 6.67* 7.89* 5.65* 4.00*  CALCIUM 7.8* 8.0* 8.4* 7.3* 6.7* 6.3* 6.9*  PHOS  --   --   --   --   --  5.3* 4.4   CBC Recent Labs  Lab 06/16/19 0432 06/17/19 0400 06/18/19 0520 06/19/19 0409 06/20/19 0453  WBC 15.6* 19.1* 20.5* 18.9* 15.2*  NEUTROABS 14.2* 17.4* 18.6* 17.5*  --   HGB 12.0* 13.2 13.0 10.8* 10.2*  HCT 38.5* 41.7 40.4 33.2* 31.4*  MCV 83.0 81.3  80.3 79.2* 78.3*  PLT 261 322 365 311 313    Medications:    . albuterol  2 puff Inhalation Q6H  . vitamin C  500 mg Oral Daily  . B-complex with vitamin C  1 tablet Per Tube Daily  . chlorhexidine  15 mL Mouth Rinse BID  . Chlorhexidine Gluconate Cloth  6 each Topical Q0600  . dexamethasone (DECADRON) injection  6 mg Intravenous Q24H  . feeding supplement (PRO-STAT SUGAR FREE 64)  30 mL Per Tube Daily  . insulin aspart   0-9 Units Subcutaneous Q4H  . mouth rinse  15 mL Mouth Rinse q12n4p  . rosuvastatin  40 mg Oral Daily  . sodium chloride flush  3 mL Intravenous Q12H  . Warfarin - Pharmacist Dosing Inpatient   Does not apply q1800  . zinc sulfate  220 mg Oral Daily      Madelon Lips, MD [facility] pgr [phone] 06/20/2019, 10:40 AM

## 2019-06-20 NOTE — Progress Notes (Signed)
PCCM Interval Progress Note  Called son with updated over the phone.   Montey Hora, [facility] 06/20/2019, 1:32 PM

## 2019-06-20 NOTE — Progress Notes (Signed)
SLP Cancellation Note  Patient Details Name: Steve Martinez MRN: [id number] DOB: 06/14/1941   Cancelled treatment: pt not ready for POs; cortrak placed today. SLP will follow for readiness to resume PO diet or participate in instrumental swallow study.  Steve Martinez L. Tivis Ringer, [place] CCC/SLP Acute Rehabilitation Services Office number [phone] Pager [phone]           Steve Martinez 06/20/2019, 5:08 PM

## 2019-06-21 LAB — C-REACTIVE PROTEIN: CRP: 7.9 mg/dL — ABNORMAL HIGH (ref <1.0)

## 2019-06-21 LAB — CBC
HCT: 26.5 % — ABNORMAL LOW (ref 39.0–52.0)
Hemoglobin: 8.4 g/dL — ABNORMAL LOW (ref 13.0–17.0)
MCH: 25.2 pg — ABNORMAL LOW (ref 26.0–34.0)
MCHC: 31.7 g/dL (ref 30.0–36.0)
MCV: 79.6 fL — ABNORMAL LOW (ref 80.0–100.0)
Platelets: 200 10*3/uL (ref 150–400)
RBC: 3.33 MIL/uL — ABNORMAL LOW (ref 4.22–5.81)
RDW: 16.1 % — ABNORMAL HIGH (ref 11.5–15.5)
WBC: 11.6 10*3/uL — ABNORMAL HIGH (ref 4.0–10.5)
nRBC: 0 % (ref 0.0–0.2)

## 2019-06-21 LAB — PROTIME-INR
INR: 2.8 — ABNORMAL HIGH (ref 0.8–1.2)
Prothrombin Time: 29.5 seconds — ABNORMAL HIGH (ref 11.4–15.2)

## 2019-06-21 LAB — RENAL FUNCTION PANEL
Albumin: 1.6 g/dL — ABNORMAL LOW (ref 3.5–5.0)
Anion gap: 13 (ref 5–15)
BUN: 42 mg/dL — ABNORMAL HIGH (ref 8–23)
CO2: 24 mmol/L (ref 22–32)
Calcium: 6.9 mg/dL — ABNORMAL LOW (ref 8.9–10.3)
Chloride: 99 mmol/L (ref 98–111)
Creatinine, Ser: 2.28 mg/dL — ABNORMAL HIGH (ref 0.61–1.24)
GFR calc Af Amer: 31 mL/min — ABNORMAL LOW (ref >60)
GFR calc non Af Amer: 26 mL/min — ABNORMAL LOW (ref >60)
Glucose, Bld: 243 mg/dL — ABNORMAL HIGH (ref 70–99)
Phosphorus: 5.3 mg/dL — ABNORMAL HIGH (ref 2.5–4.6)
Potassium: 4.7 mmol/L (ref 3.5–5.1)
Sodium: 136 mmol/L (ref 135–145)

## 2019-06-21 LAB — COMPREHENSIVE METABOLIC PANEL
ALT: 25 U/L (ref 0–44)
AST: 70 U/L — ABNORMAL HIGH (ref 15–41)
Albumin: 1.6 g/dL — ABNORMAL LOW (ref 3.5–5.0)
Alkaline Phosphatase: 135 U/L — ABNORMAL HIGH (ref 38–126)
Anion gap: 12 (ref 5–15)
BUN: 51 mg/dL — ABNORMAL HIGH (ref 8–23)
CO2: 23 mmol/L (ref 22–32)
Calcium: 6.8 mg/dL — ABNORMAL LOW (ref 8.9–10.3)
Chloride: 103 mmol/L (ref 98–111)
Creatinine, Ser: 3.01 mg/dL — ABNORMAL HIGH (ref 0.61–1.24)
GFR calc Af Amer: 22 mL/min — ABNORMAL LOW (ref >60)
GFR calc non Af Amer: 19 mL/min — ABNORMAL LOW (ref >60)
Glucose, Bld: 229 mg/dL — ABNORMAL HIGH (ref 70–99)
Potassium: 5.1 mmol/L (ref 3.5–5.1)
Sodium: 138 mmol/L (ref 135–145)
Total Bilirubin: 0.8 mg/dL (ref 0.3–1.2)
Total Protein: 6 g/dL — ABNORMAL LOW (ref 6.5–8.1)

## 2019-06-21 LAB — GLUCOSE, CAPILLARY
Glucose-Capillary: 193 mg/dL — ABNORMAL HIGH (ref 70–99)
Glucose-Capillary: 195 mg/dL — ABNORMAL HIGH (ref 70–99)
Glucose-Capillary: 204 mg/dL — ABNORMAL HIGH (ref 70–99)
Glucose-Capillary: 217 mg/dL — ABNORMAL HIGH (ref 70–99)
Glucose-Capillary: 283 mg/dL — ABNORMAL HIGH (ref 70–99)
Glucose-Capillary: 289 mg/dL — ABNORMAL HIGH (ref 70–99)

## 2019-06-21 LAB — D-DIMER, QUANTITATIVE: D-Dimer, Quant: 16.39 ug/mL-FEU — ABNORMAL HIGH (ref 0.00–0.50)

## 2019-06-21 LAB — PHOSPHORUS: Phosphorus: 5.6 mg/dL — ABNORMAL HIGH (ref 2.5–4.6)

## 2019-06-21 LAB — BRAIN NATRIURETIC PEPTIDE: B Natriuretic Peptide: 189.7 pg/mL — ABNORMAL HIGH (ref 0.0–100.0)

## 2019-06-21 LAB — MAGNESIUM: Magnesium: 2.6 mg/dL — ABNORMAL HIGH (ref 1.7–2.4)

## 2019-06-21 MED ORDER — HEPARIN SODIUM (PORCINE) 1000 UNIT/ML DIALYSIS
1000.0000 [IU] | INTRAMUSCULAR | Status: DC | PRN
  Administered 2019-06-21: 3200 [IU] via INTRAVENOUS_CENTRAL
  Filled 2019-06-21: qty 6

## 2019-06-21 MED ORDER — SODIUM ZIRCONIUM CYCLOSILICATE 10 G PO PACK: 10.0000 g | PACK | Freq: Every day | ORAL | Status: DC

## 2019-06-21 MED ORDER — SODIUM CHLORIDE 0.9 % IV SOLN
20.0000 ug | Freq: Once | INTRAVENOUS | Status: AC
  Administered 2019-06-21: 20 ug via INTRAVENOUS
  Filled 2019-06-21: qty 5

## 2019-06-21 MED ORDER — WARFARIN SODIUM 1 MG PO TABS
1.0000 mg | ORAL_TABLET | Freq: Once | ORAL | Status: DC
  Filled 2019-06-21: qty 1

## 2019-06-21 NOTE — Progress Notes (Signed)
Assisted tele visit to patient with wife.  Steve Martinez M Ahamed Hofland, RN   

## 2019-06-21 NOTE — Progress Notes (Signed)
Spoke with patients son and gave him an update. Patient also got to camera in with his son and his wife per [facility] today.

## 2019-06-21 NOTE — Progress Notes (Addendum)
[facility] Progress Note    Assessment/ Plan:   1. AoCKD: followed by me Hollie Salk) for CKD.  Latest Cr was around 3 before the holidays.  Admitted 12/24 w/ creat 3.6 which worsened up to 6-7 range. UA and renal US were negative and pt had to be initiated on CRRT.  Will d/c today and then will monitor for recovery/ need for transition to IHD 2. DM2 3. HTN 4. COVID+ PNA 5. HD cath site oozing- hgb down to 8.4.  Will order DDAVP.   6. Acute bilateral DVT- Coumadin on hold for now.    Subjective:    No pressors, tolerated CRRT well.  Net neg.  Will discontinue CRRT and monitor for need to transition to IHD.  HD cath oozing, holding coumadin- pt refused sutures.     Objective:   BP 132/62   Pulse 86   Temp (!) 97.4 F (36.3 C) (Oral)   Resp 19   Ht 6\' 1"  (1.854 m)   Wt 71.5 kg   SpO2 95%   BMI 20.80 kg/m   Intake/Output Summary (Last 24 hours) at 06/21/2019 1745 Last data filed at 06/21/2019 1600 Gross per 24 hour  Intake 723.33 ml  Output 2170 ml  Net -1446.67 ml   Weight change: 0.3 kg  Physical Exam: Patient not examined today directly given COVID-19 + status, utilizing exam of the primary team and observations of RN's.   Imaging: DG Abd 1 View  Result Date: 06/20/2019 CLINICAL DATA:  Feeding tube placement EXAM: ABDOMEN - 1 VIEW COMPARISON:  June 18, 2019 FINDINGS: The tip of the enteric tube projects over the gastric body/fundus. Oral contrast is noted in the gastric lumen. A central venous catheter is noted projecting over the right atrium. The total fluoroscopy time for the study was 3 minutes and 42 seconds. A total of 10 mL of oral contrast was administered through the tube. IMPRESSION: Enteric tube as above. Electronically Signed   By: Constance Holster M.D.   On: 06/20/2019 15:41    Labs: BMET Recent Labs  Lab 06/17/19 0400 06/18/19 0520 06/19/19 0409 06/19/19 1802 06/20/19 0453 06/20/19 1600 06/21/19 0448  NA 145 137 144 142 142 140  138  K 4.2 3.7 4.7 4.5 4.7 5.6* 5.1  CL 110 102 109 107 106 104 103  CO2 20* 17* 17* 18* 22 19* 23  GLUCOSE 210* 124* 180* 161* 159* 242* 229*  BUN 88* 106* 135* 97* 65* 62* 51*  CREATININE 5.26* 6.67* 7.89* 5.65* 4.00* 3.85* 3.01*  CALCIUM 8.4* 7.3* 6.7* 6.3* 6.9* 6.8* 6.8*  PHOS  --   --   --  5.3* 4.4 5.9* 5.6*   CBC Recent Labs  Lab 06/16/19 0432 06/17/19 0400 06/18/19 0520 06/19/19 0409 06/20/19 0453 06/21/19 0448  WBC 15.6* 19.1* 20.5* 18.9* 15.2* 11.6*  NEUTROABS 14.2* 17.4* 18.6* 17.5*  --   --   HGB 12.0* 13.2 13.0 10.8* 10.2* 8.4*  HCT 38.5* 41.7 40.4 33.2* 31.4* 26.5*  MCV 83.0 81.3 80.3 79.2* 78.3* 79.6*  PLT 261 322 365 311 313 200    Medications:    . albuterol  2 puff Inhalation Q6H  . vitamin C  500 mg Oral Daily  . B-complex with vitamin C  1 tablet Per Tube Daily  . chlorhexidine  15 mL Mouth Rinse BID  . Chlorhexidine Gluconate Cloth  6 each Topical Q0600  . feeding supplement (PRO-STAT SUGAR FREE 64)  30 mL Per Tube Daily  . insulin  aspart  0-9 Units Subcutaneous Q4H  . mouth rinse  15 mL Mouth Rinse q12n4p  . rosuvastatin  40 mg Oral Daily  . sodium chloride flush  3 mL Intravenous Q12H  . sodium zirconium cyclosilicate  10 g Oral Daily  . Warfarin - Pharmacist Dosing Inpatient   Does not apply q1800  . zinc sulfate  220 mg Oral Daily      Madelon Lips, MD [facility] pgr [phone] 06/21/2019, 5:45 PM

## 2019-06-21 NOTE — Progress Notes (Addendum)
ANTICOAGULATION CONSULT NOTE - Follow Up Consult  Pharmacy Consult for Coumadin Indication: DVT  No Known Allergies  Patient Measurements: Height: 6\' 1"  (185.4 cm) Weight: 157 lb 10.1 oz (71.5 kg) IBW/kg (Calculated) : 79.9   Vital Signs: Temp: 97.3 F (36.3 C) (01/01 0807) Temp Source: Oral (01/01 0807) BP: 124/54 (01/01 1100) Pulse Rate: 89 (01/01 1100)  Labs: Recent Labs    06/19/19 0409 06/19/19 0704 06/20/19 0453 06/20/19 1600 06/21/19 0448  HGB 10.8*  --  10.2*  --  8.4*  HCT 33.2*  --  31.4*  --  26.5*  PLT 311  --  313  --  200  LABPROT 32.1*  --  28.0*  --  29.5*  INR 3.1*  --  2.6*  --  2.8*  HEPARINUNFRC  --  0.81*  --   --   --   CREATININE 7.89*  --  4.00* 3.85* 3.01*    Estimated Creatinine Clearance: 20.5 mL/min (A) (by C-G formula based on SCr of 3.01 mg/dL (H)).  Assessment:  Anticoag: heparin gtt off, INR 2.8. Oozing from dialysis catheter site. Coumadin was held on 12/30 and 12/31. Will continue to hold. Hgb drop of 2 points.   Goal of Therapy:  INR 2-3 Monitor platelets by anticoagulation protocol: Yes   Plan:  Hold Warfarin tonight  Will need heparin again when/if INR drops <2.   Nicoletta Dress, PharmD PGY2 Infectious Disease Pharmacy Resident  Marliss Czar [facility] 06/21/2019,11:24 AM

## 2019-06-21 NOTE — Progress Notes (Signed)
NAME:  Steve Martinez, MRN:  [id number], DOB:  December 27, 1940, LOS: 9 ADMISSION DATE:  06/03/2019, CONSULTATION DATE:  06/18/2019 REFERRING MD:  Dr. Candiss Norse, CHIEF COMPLAINT:  Hypotension  Brief History   79 year old male admitted 12/23 with progressively worse SOB found to have hypoxic respiratory failure related to COVID-19. Initially admitted to [facility] but course complicated by probable aspiration and worsening AKI requiring transfer to [facility] for iHD. PCCM consulted 12/29 for worsening hypotension.   History of present illness   79 year old male with prior history of HTN, HLD, DMT2, GERD, CKD, CVA with left sided deficits/weakness admitted 12/23 to [facility] after progressive one weak history of worsening shortness of breath found to be in acute hypoxic respiratory failure and COVID- 19 positive after known exposure to wife with COVID.   He was admitted to [facility] and started on oxygen [place], decadron, and remdesivir with aggressive pulmonary hygiene and self proning.  Hospitalization complicated by aspiration event on 12/27 and started on unasyn, acute bilateral leg DVT started on heparin gtt, and worsening AKI with nephrology consulted 12/28.  Patient was transferred to [facility] on 12/29 and underwent placement of tunneled hemodialysis catheter with plans for intermittent dialysis. However, blood pressure continued to decline with concerns for worsening sepsis.  PCCM consulted for closer monitoring in ICU.   Past Medical History  HTN, HLD, DMT2, GERD, CVA with left sided deficits/weakness, MI, CKD  Significant Hospital Events   12/23 Ad,itted [facility] 12/29 transfer [facility]/ TDC placement 12/30 did not tolerated HD (became unresponsive), started on CRRT, tocilizumab x 1  Consults:  Nephrology 12/28 PCCM 12/29  Procedures:  12/29 Right TDC >>  Significant Diagnostic Tests:  12/24 DVT BLE [place] >> Right: Findings consistent with acute deep vein thrombosis involving the right peroneal veins. No cystic structure found in the  popliteal fossa. Left: Findings consistent with acute superficial vein thrombosis involving the left small saphenous vein. No cystic structure found in the popliteal fossa.  12/26 renal US >> No acute renal findings and negative urinary bladder. Chronic medical renal disease suspected.  12/27 TTE >>  1. Left ventricular ejection fraction, by visual estimation, is >75%. The left ventricle has hyperdynamic function. There is no left ventricular hypertrophy.  2. Left ventricular diastolic parameters are normal for age. The "impaired relaxation" pattern of mitral inflow is probably due to low left atrial pressures ("underfilling") and increased cardiac output.  3. The left ventricle has no regional wall motion abnormalities.  4. Global right ventricle has hyperdynamic systolic function.The right ventricular size is normal. No increase in right ventricular wall thickness.  5. Left atrial size was normal.  6. Right atrial size was normal.  7. The mitral valve is normal in structure. No evidence of mitral valve regurgitation.  8. The tricuspid valve is normal in structure.  9. The aortic valve is normal in structure. Aortic valve regurgitation is not visualized. Mild aortic valve sclerosis without stenosis. 10. The pulmonic valve was grossly normal. Pulmonic valve regurgitation is not visualized. 11. Normal pulmonary artery systolic pressure. 12. The inferior vena cava is collapsed, consistent with low right atrial pressure.  Micro Data:  12/23 SARS Coronavirus 2 POSITIVE 12/23 Blood Negative 12/23 Urine multiple species  Antimicrobials:  Amp/Sulbactam 12/27>> Ceftriaxone 12/23 x 1 dose Doxycycline 12/23 x 1 dose Remdesivir 12/23>>12/27 Decadron 12/23>>  Interim history/subjective:  Lying in bed states he feels well but appears weak. Tolerating CRRT, 317ml removed  Objective   Blood pressure (!) 125/59, pulse 81, temperature (!)  97.3 F (36.3 C), temperature source Oral, resp. rate (!)  21, height 6\' 1"  (1.854 m), weight 71.5 kg, SpO2 97 %.        Intake/Output Summary (Last 24 hours) at 06/21/2019 1040 Last data filed at 06/21/2019 0900 Gross per 24 hour  Intake 651.71 ml  Output 1682 ml  Net -1030.29 ml   Filed Weights   06/19/19 0858 06/20/19 0352 06/21/19 0453  Weight: 71.2 kg 72.1 kg 71.5 kg    Examination: General: Chronically ill appearing elderly male, in NAD HEENT: [place]/AT, Cortrak tube in place, MM pink/moist, PERRL,  Neuro: Alert with flat affect, able to follow commands CV: s1s2 regular rate and rhythm, no murmur, rubs, or gallops,  PULM:  Clear but diminished bilaterally  GI: soft, bowel sounds active in all 4 quadrants, non-tender, non-distended, tolerating TF Extremities: warm/dry, no edema  Skin: no rashes or lesions  Assessment & Plan:   Acute hypoxic respiratory failure 2/2 COVID 19 Pneumonitis c/b aspiration pneumonia with possible sepsis.  -Procal elevated at 2.19.  Unasyn initiated by [facility]. Has completed remdesivir. Remains on decadron. 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  -SLP following. On Dysphagia 3 diet. Plan was for MBS when able and NPO if resp distress Plan: NPO Cortrak place under flouro, per nursing appear tube appears clogged  Continue TF as able  Protein supplementation   B/L DVT on coumadin  with therapeutic INR.  Plan: Continue Coumadin   DM II. A1c 9.6 06/13/2019.  -On steroids Plan: Q4hr SSI Hold home meal coverage while NPO(was on 20u BID AC) May need TF coverage added   Ileus Plan: Supportive care  NPO Continue trickle tube feeds   Bleed at HD catheter site  -IR evaluated site at bedside 12/31, recommended placement of suture at insertion site but patient refused P: Keep Quickclot pad in place IF oozing worsens hold pressure and notify IR IR to continue to follow, appreciate assistance   Best practice:  Diet: NPO.  Ttrickle feeds  Pain/Anxiety/Delirium protocol (if indicated): n/a VAP protocol (if indicated): n/a DVT prophylaxis: Warfarin GI prophylaxis: PPI Glucose control: SSI Mobility: BR Code Status: Full  Family Communication: Will call this afternoon Disposition: ICU   Johnsie Cancel, NP-C [place] Contact / Pager information can be found on Amion  06/21/2019, 10:54 AM

## 2019-06-21 DEATH — deceased

## 2019-06-22 ENCOUNTER — Inpatient Hospital Stay (HOSPITAL_COMMUNITY): Payer: Medicare HMO

## 2019-06-22 LAB — BRAIN NATRIURETIC PEPTIDE: B Natriuretic Peptide: 242.3 pg/mL — ABNORMAL HIGH (ref 0.0–100.0)

## 2019-06-22 LAB — CBC
HCT: 25.1 % — ABNORMAL LOW (ref 39.0–52.0)
Hemoglobin: 8.1 g/dL — ABNORMAL LOW (ref 13.0–17.0)
MCH: 26.2 pg (ref 26.0–34.0)
MCHC: 32.3 g/dL (ref 30.0–36.0)
MCV: 81.2 fL (ref 80.0–100.0)
Platelets: 148 10*3/uL — ABNORMAL LOW (ref 150–400)
RBC: 3.09 MIL/uL — ABNORMAL LOW (ref 4.22–5.81)
RDW: 16.2 % — ABNORMAL HIGH (ref 11.5–15.5)
WBC: 11.7 10*3/uL — ABNORMAL HIGH (ref 4.0–10.5)
nRBC: 0 % (ref 0.0–0.2)

## 2019-06-22 LAB — GLUCOSE, CAPILLARY
Glucose-Capillary: 284 mg/dL — ABNORMAL HIGH (ref 70–99)
Glucose-Capillary: 286 mg/dL — ABNORMAL HIGH (ref 70–99)
Glucose-Capillary: 289 mg/dL — ABNORMAL HIGH (ref 70–99)
Glucose-Capillary: 218 mg/dL — ABNORMAL HIGH (ref 70–99)
Glucose-Capillary: 286 mg/dL — ABNORMAL HIGH (ref 70–99)

## 2019-06-22 LAB — COMPREHENSIVE METABOLIC PANEL
ALT: 22 U/L (ref 0–44)
AST: 49 U/L — ABNORMAL HIGH (ref 15–41)
Albumin: 1.6 g/dL — ABNORMAL LOW (ref 3.5–5.0)
Alkaline Phosphatase: 86 U/L (ref 38–126)
Anion gap: 12 (ref 5–15)
BUN: 80 mg/dL — ABNORMAL HIGH (ref 8–23)
CO2: 22 mmol/L (ref 22–32)
Calcium: 6.5 mg/dL — ABNORMAL LOW (ref 8.9–10.3)
Chloride: 104 mmol/L (ref 98–111)
Creatinine, Ser: 4.43 mg/dL — ABNORMAL HIGH (ref 0.61–1.24)
GFR calc Af Amer: 14 mL/min — ABNORMAL LOW (ref >60)
GFR calc non Af Amer: 12 mL/min — ABNORMAL LOW (ref >60)
Glucose, Bld: 336 mg/dL — ABNORMAL HIGH (ref 70–99)
Potassium: 5.7 mmol/L — ABNORMAL HIGH (ref 3.5–5.1)
Sodium: 138 mmol/L (ref 135–145)
Total Bilirubin: 0.6 mg/dL (ref 0.3–1.2)
Total Protein: 5.7 g/dL — ABNORMAL LOW (ref 6.5–8.1)

## 2019-06-22 LAB — C-REACTIVE PROTEIN: CRP: 4.9 mg/dL — ABNORMAL HIGH (ref <1.0)

## 2019-06-22 LAB — PROTIME-INR
INR: 2.7 — ABNORMAL HIGH (ref 0.8–1.2)
Prothrombin Time: 28.5 seconds — ABNORMAL HIGH (ref 11.4–15.2)

## 2019-06-22 LAB — D-DIMER, QUANTITATIVE: D-Dimer, Quant: 18.85 ug/mL-FEU — ABNORMAL HIGH (ref 0.00–0.50)

## 2019-06-22 LAB — MAGNESIUM: Magnesium: 3.2 mg/dL — ABNORMAL HIGH (ref 1.7–2.4)

## 2019-06-22 MED ORDER — HEPARIN SODIUM (PORCINE) 1000 UNIT/ML DIALYSIS: 1000.0000 [IU] | INTRAMUSCULAR | Status: DC | PRN

## 2019-06-22 MED ORDER — WARFARIN SODIUM 2 MG PO TABS
2.0000 mg | ORAL_TABLET | Freq: Once | ORAL | Status: DC
  Filled 2019-06-22 (×2): qty 1

## 2019-06-22 MED ORDER — SODIUM CHLORIDE 0.9 % IV SOLN: 3.0000 g | Freq: Two times a day (BID) | INTRAVENOUS | Status: DC

## 2019-06-22 MED ORDER — SODIUM ZIRCONIUM CYCLOSILICATE 10 G PO PACK: 10.0000 g | PACK | Freq: Two times a day (BID) | ORAL | Status: DC

## 2019-06-22 MED ORDER — LIDOCAINE HCL (PF) 1 % IJ SOLN: 5.0000 mL | INTRAMUSCULAR | Status: DC | PRN

## 2019-06-22 MED ORDER — LIDOCAINE-PRILOCAINE 2.5-2.5 % EX CREA
1.0000 "application " | TOPICAL_CREAM | CUTANEOUS | Status: DC | PRN
  Filled 2019-06-22: qty 5

## 2019-06-22 MED ORDER — HEPARIN SODIUM (PORCINE) 1000 UNIT/ML IJ SOLN
INTRAMUSCULAR | Status: AC
  Filled 2019-06-22: qty 4

## 2019-06-22 MED ORDER — SODIUM ZIRCONIUM CYCLOSILICATE 10 G PO PACK: 10.0000 g | PACK | Freq: Once | ORAL | Status: DC

## 2019-06-22 MED ORDER — SODIUM CHLORIDE 0.9 % IV SOLN: 100.0000 mL | INTRAVENOUS | Status: DC | PRN

## 2019-06-22 MED ORDER — ALTEPLASE 2 MG IJ SOLR: 2.0000 mg | Freq: Once | INTRAMUSCULAR | Status: DC | PRN

## 2019-06-22 MED ORDER — CHLORHEXIDINE GLUCONATE CLOTH 2 % EX PADS: 6.0000 | MEDICATED_PAD | Freq: Every day | CUTANEOUS | Status: DC

## 2019-06-22 MED ORDER — HEPARIN SODIUM (PORCINE) 1000 UNIT/ML IJ SOLN
3.2000 mL | Freq: Once | INTRAMUSCULAR | Status: AC
  Administered 2019-06-24: 3200 [IU] via INTRAVENOUS

## 2019-06-22 MED ORDER — SODIUM ZIRCONIUM CYCLOSILICATE 10 G PO PACK
10.0000 g | PACK | Freq: Two times a day (BID) | ORAL | Status: DC
  Administered 2019-06-23 – 2019-06-26 (×4): 10 g via ORAL
  Filled 2019-06-22 (×4): qty 1

## 2019-06-22 MED ORDER — PENTAFLUOROPROP-TETRAFLUOROETH EX AERO: 1.0000 "application " | INHALATION_SPRAY | CUTANEOUS | Status: DC | PRN

## 2019-06-22 MED ORDER — INSULIN ASPART 100 UNIT/ML ~~LOC~~ SOLN
3.0000 [IU] | SUBCUTANEOUS | Status: DC
  Administered 2019-06-22 – 2019-06-23 (×5): 3 [IU] via SUBCUTANEOUS

## 2019-06-22 NOTE — Progress Notes (Signed)
[facility] Progress Note    Assessment/ Plan:   1. AoCKD: followed by me Hollie Salk) for CKD.  Latest Cr was around 3 before the holidays.  Admitted 12/24 w/ creat 3.6 which worsened up to 6-7 range. UA and renal US were negative and pt had to be initiated on CRRT.  D/c'd 1/12021, IHD today.  Lokelma for mild hyperK, have added BID dosing 2. DM2 3. HTN 4. COVID+ PNA 5. HD cath site oozing- hgb down to 8.4.  S/p DDAVP 6. Acute bilateral DVT- Coumadin on hold for now.    Subjective:    For IHD today.  Pt SOB, says he still needs O2.     Objective:   BP 135/61   Pulse 85   Temp (!) 97.4 F (36.3 C) (Oral)   Resp (!) 23   Ht 6\' 1"  (1.854 m)   Wt 67 kg   SpO2 94%   BMI 19.49 kg/m   Intake/Output Summary (Last 24 hours) at 06/22/2019 1401 Last data filed at 06/22/2019 1300 Gross per 24 hour  Intake 710.59 ml  Output 303 ml  Net 407.59 ml   Weight change: -4.5 kg  Physical Exam: GEN: appears ill, sitting in bed HEENT EOMI PERRL NECK no JVD PULM bilateral rhonchi CV RRR ABD soft EXT trace edema NEURO AAO x 3 ACCESS: R IJ TDC with thrombi-pads in place   Imaging: DG Abd 1 View  Result Date: 06/20/2019 CLINICAL DATA:  Feeding tube placement EXAM: ABDOMEN - 1 VIEW COMPARISON:  June 18, 2019 FINDINGS: The tip of the enteric tube projects over the gastric body/fundus. Oral contrast is noted in the gastric lumen. A central venous catheter is noted projecting over the right atrium. The total fluoroscopy time for the study was 3 minutes and 42 seconds. A total of 10 mL of oral contrast was administered through the tube. IMPRESSION: Enteric tube as above. Electronically Signed   By: Constance Holster M.D.   On: 06/20/2019 15:41   DG Abd Portable 1V  Result Date: 06/22/2019 CLINICAL DATA:  Ileus.  COVID-19 positive. EXAM: PORTABLE ABDOMEN - 1 VIEW COMPARISON:  One day prior and back to 06/18/2019. FINDINGS: Single supine view of the abdomen and pelvis. Resolution  of previously described gas filled small bowel loops. Bowel gas pattern currently within normal limits. No abnormal abdominal calcifications. No appendicolith. Distal gas. IMPRESSION: No acute findings.  Resolved adynamic ileus. Electronically Signed   By: Abigail Miyamoto M.D.   On: 06/22/2019 12:46    Labs: BMET Recent Labs  Lab 06/19/19 0409 06/19/19 1802 06/20/19 0453 06/20/19 1600 06/21/19 0448 06/21/19 1840 06/22/19 0803  NA 144 142 142 140 138 136 138  K 4.7 4.5 4.7 5.6* 5.1 4.7 5.7*  CL 109 107 106 104 103 99 104  CO2 17* 18* 22 19* 23 24 22   GLUCOSE 180* 161* 159* 242* 229* 243* 336*  BUN 135* 97* 65* 62* 51* 42* 80*  CREATININE 7.89* 5.65* 4.00* 3.85* 3.01* 2.28* 4.43*  CALCIUM 6.7* 6.3* 6.9* 6.8* 6.8* 6.9* 6.5*  PHOS  --  5.3* 4.4 5.9* 5.6* 5.3*  --    CBC Recent Labs  Lab 06/16/19 0432 06/17/19 0400 06/18/19 0520 06/19/19 0409 06/20/19 0453 06/21/19 0448 06/22/19 0803  WBC 15.6* 19.1* 20.5* 18.9* 15.2* 11.6* 11.7*  NEUTROABS 14.2* 17.4* 18.6* 17.5*  --   --   --   HGB 12.0* 13.2 13.0 10.8* 10.2* 8.4* 8.1*  HCT 38.5* 41.7 40.4 33.2* 31.4* 26.5* 25.1*  MCV 83.0 81.3 80.3 79.2* 78.3* 79.6* 81.2  PLT 261 322 365 311 313 200 148*    Medications:    . albuterol  2 puff Inhalation Q6H  . vitamin C  500 mg Oral Daily  . B-complex with vitamin C  1 tablet Per Tube Daily  . chlorhexidine  15 mL Mouth Rinse BID  . Chlorhexidine Gluconate Cloth  6 each Topical Q0600  . feeding supplement (PRO-STAT SUGAR FREE 64)  30 mL Per Tube Daily  . insulin aspart  0-9 Units Subcutaneous Q4H  . insulin aspart  3 Units Subcutaneous Q4H  . mouth rinse  15 mL Mouth Rinse q12n4p  . rosuvastatin  40 mg Oral Daily  . sodium chloride flush  3 mL Intravenous Q12H  . sodium zirconium cyclosilicate  10 g Oral Once  . sodium zirconium cyclosilicate  10 g Oral BID  . warfarin  2 mg Oral ONCE-1800  . Warfarin - Pharmacist Dosing Inpatient   Does not apply q1800  . zinc sulfate  220 mg  Oral Daily      Madelon Lips, MD [facility] pgr [phone] 06/22/2019, 2:01 PM

## 2019-06-22 NOTE — Progress Notes (Signed)
Called hemodialysis to confirm PT treatment for today. Was informed that treatment has been rescheduled for tomorrow.

## 2019-06-22 NOTE — Progress Notes (Signed)
Tarry Kos, RN called and notified the patient's HD tx has been moved to 06/23/19.

## 2019-06-22 NOTE — Progress Notes (Signed)
Patients cortrak tube is clogged after many attempts of unclogging.  MD made aware.  Attempted to call IR to exchange tube, but they are not open on the weekends.  Attempted to get another speech eval but they thought he would need more then just a bedside swallow which can not done on the weekends.   Will continue to closely monitor patient

## 2019-06-22 NOTE — Progress Notes (Signed)
NAME:  Steve Martinez, MRN:  [id number], DOB:  02-May-1941, LOS: 67 ADMISSION DATE:  05/30/2019, CONSULTATION DATE:  06/18/2019 REFERRING MD:  Dr. Candiss Norse, CHIEF COMPLAINT:  Hypotension  Brief History    79 year old male with prior history of HTN, HLD, DMT2, GERD, CKD, CVA with left sided deficits/weakness admitted 12/23 to [facility] after progressive one weak history of worsening shortness of breath found to be in acute hypoxic respiratory failure and COVID- 19 positive after known exposure to wife with COVID.   He was admitted to [facility] and started on oxygen [place], decadron, and remdesivir with aggressive pulmonary hygiene and self proning.  Hospitalization complicated by aspiration event on 12/27 and started on unasyn, acute bilateral leg DVT started on heparin gtt, and worsening AKI with nephrology consulted 12/28.  Patient was transferred to [facility] on 12/29 and underwent placement of tunneled hemodialysis catheter with plans for intermittent dialysis. However, blood pressure continued to decline with concerns for worsening sepsis.  PCCM consulted for closer monitoring in ICU.   Past Medical History  HTN, HLD, DMT2, GERD, CVA with left sided deficits/weakness, MI, CKD  Significant Hospital Events   12/23 Ad,itted [facility] 12/29 transfer [facility]/ TDC placement 12/30 did not tolerated HD (became unresponsive), started on CRRT, tocilizumab x 1 1/1 - oozing from HD cath  + - Lying in bed states he feels well but appears weak. Tolerating CRRT, 393ml removed.   Consults:  Nephrology 12/28 PCCM 12/29  Procedures:  12/29 Right TDC >>  Significant Diagnostic Tests:  12/24 DVT BLE [place] >> Right: Findings consistent with acute deep vein thrombosis involving the right peroneal veins. No cystic structure found in the popliteal fossa. Left: Findings consistent with acute superficial vein thrombosis involving the left small saphenous vein. No cystic structure found in the popliteal fossa.  12/26 renal US >> No acute renal  findings and negative urinary bladder. Chronic medical renal disease suspected.  12/27 TTE >>  1. Left ventricular ejection fraction, by visual estimation, is >75%. The left ventricle has hyperdynamic function. There is no left ventricular hypertrophy.  2. Left ventricular diastolic parameters are normal for age. The "impaired relaxation" pattern of mitral inflow is probably due to low left atrial pressures ("underfilling") and increased cardiac output.  3. The left ventricle has no regional wall motion abnormalities.  4. Global right ventricle has hyperdynamic systolic function.The right ventricular size is normal. No increase in right ventricular wall thickness.  5. Left atrial size was normal.  6. Right atrial size was normal.  7. The mitral valve is normal in structure. No evidence of mitral valve regurgitation.  8. The tricuspid valve is normal in structure.  9. The aortic valve is normal in structure. Aortic valve regurgitation is not visualized. Mild aortic valve sclerosis without stenosis. 10. The pulmonic valve was grossly normal. Pulmonic valve regurgitation is not visualized. 11. Normal pulmonary artery systolic pressure. 12. The inferior vena cava is collapsed, consistent with low right atrial pressure.  Micro Data:  12/23 SARS Coronavirus 2 POSITIVE 12/23 Blood Negative 12/23 Urine multiple species  Antimicrobials:  Amp/Sulbactam 12/27>> Ceftriaxone 12/23 x 1 dose Doxycycline 12/23 x 1 dose Remdesivir 12/23>>12/27 Decadron 12/23>> x10 days Tocilizumab 12/30  X 1   Interim history/subjective:   06/22/2019 he is off CRRT.  The bleeding from the right subclavian hemodialysis catheter site has improved.  His anticoagulation is still on hold.  His oxygenation is down to 4 L nasal cannula and is feeling better.  However he still is  in bed.  He is not doing incentive spirometry.  His cachexia continues.  He has a core track but it is only giving him.  At a low rate because there  is a blockage.  The RN has been unable to unclog.  He continues to be constipated but then he is not had any much p.o.  He did get DDAVP x1 dose on June 21, 2019 at 6 PM per the [facility] but I do not see a note.  He has completed Decadron  Objective   Blood pressure 121/68, pulse 86, temperature 98.4 F (36.9 C), temperature source Oral, resp. rate (!) 22, height 6\' 1"  (1.854 m), weight 67 kg, SpO2 96 %.        Intake/Output Summary (Last 24 hours) at 06/22/2019 1056 Last data filed at 06/22/2019 1000 Gross per 24 hour  Intake 742.87 ml  Output 622 ml  Net 120.87 ml   Filed Weights   06/20/19 0352 06/21/19 0453 06/22/19 0500  Weight: 72.1 kg 71.5 kg 67 kg   General Appearance:  Looks chronically unwell and deconditioned Head:  Normocephalic, without obvious abnormality, atraumatic Eyes:  PERRL -yes, conjunctiva/corneas -married     Ears:  Normal external ear canals, both ears Nose:  G tube -yes Throat:  ETT TUBE -no, OG tube -no but has 4 L nasal cannula oxygen Neck:  Supple,  No enlargement/tenderness/nodules Lungs: Clear to auscultation bilaterally,  Heart:  S1 and S2 normal, no murmur, CVP -no.  Pressors -no Abdomen:  Soft, no masses, no organomegaly Genitalia / Rectal:  Not done Extremities:  Extremities-intact Skin:  ntact in exposed areas . Sacral area -not examined Neurologic:  Sedation -none-> RASS -+1. Moves all 4s -yes. CAM-ICU -negative. Orientation -x3+      Assessment & Plan:   Acute hypoxic respiratory failure 2/2 COVID 19 Pneumonitis c/b aspiration pneumonia with possible sepsis.  -Procal elevated at 2.19.  Unasyn initiated by [facility]. Has completed remdesivir. Remains on decadron. Received Actemra x 1 on 12/30 completed Decadron 06/22/2019 -improved oxygenation to 4 L nasal cannula  Plan: Continue HFNC SPO2 goal >/= 88% Self prone when able currently limited due to CRRT Start into spirometry.    Hypotension - resolved  June 22, 2019 remains  normotensive.  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  Awaiting renal input on timing of hemodialysis  Plan: Per nephrology ? HD 06/22/2019 due to K 5.7  Electrolyte imbalance  06/22/2019  -K 5.7  Plan  - lokelma   Renal to dcide HD timing    Dysphagia.  -SLP following. On Dysphagia 3 diet. Plan was for MBS when able and NPO if resp distress/status post core track?  June 20, 2019  June 22, 2019: [Partially clogged cortrak   Plan: NPO Cortrak place under flouro, per nursing appear tube appears clogged  Continue TF as able  Protein supplementation  RN to check with interventional radiology of the tube feed team to unclog the nasogastric tube  B/L DVT on coumadin with therapeutic INR.  Bleeding from HD cath site subclavian status post DDAVP June 21, 2019  June 22, 2019: Coumadin on hold because of dialysis catheter bleeding with INR being therapeutic.  Today INR 2.7.  Status post DDAVP June 21, 2019  \\Plan: Continue Coumadin per protocol  DM II.  A1c 9.6 06/13/2019.    Plan: Q4hr SSI Hold home meal coverage while NPO(was on 20u BID AC) May need TF coverage added   Ileus  June 22, 2019: Abdomen appears soft  Plan: Supportive care  NPO Continue trickle tube feeds  Check abdominal x-ray  Bleed at HD catheter site  -IR evaluated site at bedside 12/31, recommended placement of suture at insertion site but patient refused -MAR reveals status post DDAVP June 21, 2019 at 6 PM   P: Keep Quickclot pad in place IF oozing worsens hold pressure and notify IR for sutures   Physical Deconditioning - significant  Plan  - likely needs rehab - sit on chair - oob   Best practice:  Diet: NPO.  Ttrickle feeds  Pain/Anxiety/Delirium protocol (if  indicated): n/a VAP protocol (if indicated): n/a DVT prophylaxis: Warfarin GI prophylaxis: PPI Glucose control: SSI Mobility: BR Code Status: Full  Family Communication:  -Directly with the patient    Disposition: ICU -> moved to the floor June 22, 2019 and tried hospitalist to be primary from June 23, 2019    ATTESTATION & SIGNATURE     Dr. Brand Males, M.D., F.C.C.P Pulmonary and Critical Care Medicine Staff Physician [facility] Pager: [phone], If no answer or between  15:00h - 7:00h: call [phone]  06/22/2019 11:07 AM     LABS    PULMONARY No results for input(s): PHART, PCO2ART, PO2ART, HCO3, TCO2, O2SAT in the last 168 hours.  Invalid input(s): PCO2, PO2  CBC Recent Labs  Lab 06/20/19 0453 06/21/19 0448 06/22/19 0803  HGB 10.2* 8.4* 8.1*  HCT 31.4* 26.5* 25.1*  WBC 15.2* 11.6* 11.7*  PLT 313 200 148*    COAGULATION Recent Labs  Lab 06/18/19 0520 06/19/19 0409 06/20/19 0453 06/21/19 0448 06/22/19 0803  INR 2.5* 3.1* 2.6* 2.8* 2.7*    CARDIAC  No results for input(s): TROPONINI in the last 168 hours. No results for input(s): PROBNP in the last 168 hours.   CHEMISTRY Recent Labs  Lab 06/19/19 1802 06/20/19 0453 06/20/19 1600 06/21/19 0448 06/21/19 1840 06/22/19 0803  NA 142 142 140 138 136 138  K 4.5 4.7 5.6* 5.1 4.7 5.7*  CL 107 106 104 103 99 104  CO2 18* 22 19* 23 24 22   GLUCOSE 161* 159* 242* 229* 243* 336*  BUN 97* 65* 62* 51* 42* 80*  CREATININE 5.65* 4.00* 3.85* 3.01* 2.28* 4.43*  CALCIUM 6.3* 6.9* 6.8* 6.8* 6.9* 6.5*  MG 2.6* 2.6* 2.7* 2.6*  --  3.2*  PHOS 5.3* 4.4 5.9* 5.6* 5.3*  --    Estimated Creatinine Clearance: 13 mL/min (A) (by C-G formula based on SCr of 4.43 mg/dL (H)).   LIVER Recent Labs  Lab 06/17/19 0400 06/18/19 0520 06/19/19 0409 06/20/19 0453 06/20/19 1600 06/21/19 0448 06/21/19 1840 06/22/19 0803  AST 54*  --  59* 64*  --  70*   --  49*  ALT 31  --  25 26  --  25  --  22  ALKPHOS 123  --  168* 187*  --  135*  --  86  BILITOT 0.8  --  0.7 0.9  --  0.8  --  0.6  PROT 7.2  --  6.3* 6.7  --  6.0*  --  5.7*  ALBUMIN 2.2*  --  1.6* 1.6* 1.5* 1.6* 1.6* 1.6*  INR 2.0* 2.5* 3.1* 2.6*  --  2.8*  --  2.7*     INFECTIOUS Recent Labs  Lab 06/16/19 1655 06/17/19 0400 06/18/19 0520 06/18/19 1945 06/18/19 2132  LATICACIDVEN  --   --   --  2.2* 1.5  PROCALCITON 1.23 1.66 2.19  --   --      ENDOCRINE CBG (last 3)  Recent Labs    06/21/19 2335 06/22/19 0502 06/22/19 0812  GLUCAP 289* 284* 286*         IMAGING x48h  - image(s) personally visualized  -   highlighted in bold DG Abd 1 View  Result Date: 06/20/2019 CLINICAL DATA:  Feeding tube placement EXAM: ABDOMEN - 1 VIEW COMPARISON:  June 18, 2019 FINDINGS: The tip of the enteric tube projects over the gastric body/fundus. Oral contrast is noted in the gastric lumen. A central venous catheter is noted projecting over the right atrium. The total fluoroscopy time for the study was 3 minutes and 42 seconds. A total of 10 mL of oral contrast was administered through the tube. IMPRESSION: Enteric tube as above. Electronically Signed   By: Constance Holster M.D.   On: 06/20/2019 15:41

## 2019-06-22 NOTE — Progress Notes (Signed)
ANTICOAGULATION CONSULT NOTE - Follow Up Consult  Pharmacy Consult for Coumadin Indication: DVT  No Known Allergies  Patient Measurements: Height: 6\' 1"  (185.4 cm) Weight: 147 lb 11.3 oz (67 kg) IBW/kg (Calculated) : 79.9   Vital Signs: Temp: 97.4 F (36.3 C) (01/02 1155) Temp Source: Oral (01/02 1155) BP: 122/61 (01/02 1200) Pulse Rate: 86 (01/02 1200)  Labs: Recent Labs    06/20/19 0453 06/21/19 0448 06/21/19 1840 06/22/19 0803  HGB 10.2* 8.4*  --  8.1*  HCT 31.4* 26.5*  --  25.1*  PLT 313 200  --  148*  LABPROT 28.0* 29.5*  --  28.5*  INR 2.6* 2.8*  --  2.7*  CREATININE 4.00* 3.01* 2.28* 4.43*    Estimated Creatinine Clearance: 13 mL/min (A) (by C-G formula based on SCr of 4.43 mg/dL (H)).  Assessment:  Anticoag: heparin gtt off, INR 2.7. There was oozing from dialysis catheter site. Coumadin was held on 12/30-1/1 and patient received a dose of DDAVP. It is now ok to resume warfarin.   Goal of Therapy:  INR 2-3 Monitor platelets by anticoagulation protocol: Yes   Plan:  Warfarin 2 mg tonight  Will need heparin again when/if INR drops <2.   Nicoletta Dress, PharmD PGY2 Infectious Disease Pharmacy Resident  Phillis Haggis 06/22/2019,12:38 PM

## 2019-06-23 LAB — GLUCOSE, CAPILLARY
Glucose-Capillary: 161 mg/dL — ABNORMAL HIGH (ref 70–99)
Glucose-Capillary: 168 mg/dL — ABNORMAL HIGH (ref 70–99)
Glucose-Capillary: 177 mg/dL — ABNORMAL HIGH (ref 70–99)
Glucose-Capillary: 181 mg/dL — ABNORMAL HIGH (ref 70–99)
Glucose-Capillary: 197 mg/dL — ABNORMAL HIGH (ref 70–99)
Glucose-Capillary: 216 mg/dL — ABNORMAL HIGH (ref 70–99)
Glucose-Capillary: 237 mg/dL — ABNORMAL HIGH (ref 70–99)
Glucose-Capillary: 267 mg/dL — ABNORMAL HIGH (ref 70–99)

## 2019-06-23 LAB — BASIC METABOLIC PANEL
Anion gap: 16 — ABNORMAL HIGH (ref 5–15)
BUN: 119 mg/dL — ABNORMAL HIGH (ref 8–23)
CO2: 22 mmol/L (ref 22–32)
Calcium: 6.5 mg/dL — ABNORMAL LOW (ref 8.9–10.3)
Chloride: 104 mmol/L (ref 98–111)
Creatinine, Ser: 6.42 mg/dL — ABNORMAL HIGH (ref 0.61–1.24)
GFR calc Af Amer: 9 mL/min — ABNORMAL LOW (ref >60)
GFR calc non Af Amer: 8 mL/min — ABNORMAL LOW (ref >60)
Glucose, Bld: 216 mg/dL — ABNORMAL HIGH (ref 70–99)
Potassium: 5.7 mmol/L — ABNORMAL HIGH (ref 3.5–5.1)
Sodium: 142 mmol/L (ref 135–145)

## 2019-06-23 LAB — HEPATIC FUNCTION PANEL
ALT: 21 U/L (ref 0–44)
AST: 41 U/L (ref 15–41)
Albumin: 1.6 g/dL — ABNORMAL LOW (ref 3.5–5.0)
Alkaline Phosphatase: 86 U/L (ref 38–126)
Bilirubin, Direct: <0.1 mg/dL (ref 0.0–0.2)
Total Bilirubin: 0.5 mg/dL (ref 0.3–1.2)
Total Protein: 5.8 g/dL — ABNORMAL LOW (ref 6.5–8.1)

## 2019-06-23 LAB — CBC WITH DIFFERENTIAL/PLATELET
Abs Immature Granulocytes: 0.1 10*3/uL — ABNORMAL HIGH (ref 0.00–0.07)
Basophils Absolute: 0 10*3/uL (ref 0.0–0.1)
Basophils Relative: 0 %
Eosinophils Absolute: 0 10*3/uL (ref 0.0–0.5)
Eosinophils Relative: 0 %
HCT: 27.3 % — ABNORMAL LOW (ref 39.0–52.0)
Hemoglobin: 8.6 g/dL — ABNORMAL LOW (ref 13.0–17.0)
Immature Granulocytes: 1 %
Lymphocytes Relative: 6 %
Lymphs Abs: 0.7 10*3/uL (ref 0.7–4.0)
MCH: 26.2 pg (ref 26.0–34.0)
MCHC: 31.5 g/dL (ref 30.0–36.0)
MCV: 83.2 fL (ref 80.0–100.0)
Monocytes Absolute: 0.7 10*3/uL (ref 0.1–1.0)
Monocytes Relative: 6 %
Neutro Abs: 10.5 10*3/uL — ABNORMAL HIGH (ref 1.7–7.7)
Neutrophils Relative %: 87 %
Platelets: 137 10*3/uL — ABNORMAL LOW (ref 150–400)
RBC: 3.28 MIL/uL — ABNORMAL LOW (ref 4.22–5.81)
RDW: 16.2 % — ABNORMAL HIGH (ref 11.5–15.5)
WBC: 12.1 10*3/uL — ABNORMAL HIGH (ref 4.0–10.5)
nRBC: 0 % (ref 0.0–0.2)

## 2019-06-23 LAB — QUANTIFERON-TB GOLD PLUS: QuantiFERON-TB Gold Plus: UNDETERMINED — AB

## 2019-06-23 LAB — QUANTIFERON-TB GOLD PLUS (RQFGPL)
QuantiFERON Mitogen Value: 0.04 IU/mL
QuantiFERON Nil Value: 0.03 IU/mL
QuantiFERON TB1 Ag Value: 0.03 IU/mL
QuantiFERON TB2 Ag Value: 0.03 IU/mL

## 2019-06-23 LAB — PROTIME-INR
INR: 2.2 — ABNORMAL HIGH (ref 0.8–1.2)
Prothrombin Time: 24.5 seconds — ABNORMAL HIGH (ref 11.4–15.2)

## 2019-06-23 MED ORDER — WARFARIN SODIUM 2.5 MG PO TABS
2.5000 mg | ORAL_TABLET | Freq: Once | ORAL | Status: AC
  Administered 2019-06-23: 2.5 mg via ORAL
  Filled 2019-06-23: qty 1

## 2019-06-23 MED ORDER — SODIUM POLYSTYRENE SULFONATE 15 GM/60ML PO SUSP
15.0000 g | Freq: Once | ORAL | Status: AC
  Administered 2019-06-23: 15 g via RECTAL
  Filled 2019-06-23 (×2): qty 60

## 2019-06-23 MED ORDER — CALCIUM GLUCONATE-NACL 1-0.675 GM/50ML-% IV SOLN
1.0000 g | Freq: Once | INTRAVENOUS | Status: AC
  Administered 2019-06-23: 1000 mg via INTRAVENOUS
  Filled 2019-06-23: qty 50

## 2019-06-23 MED ORDER — SODIUM BICARBONATE 8.4 % IV SOLN
50.0000 meq | Freq: Once | INTRAVENOUS | Status: AC
  Administered 2019-06-23: 50 meq via INTRAVENOUS
  Filled 2019-06-23 (×3): qty 50

## 2019-06-23 NOTE — Progress Notes (Addendum)
  Speech Language Pathology Treatment: Dysphagia  Patient Details Name: Steve Martinez MRN: [id number] DOB: 1940/10/29 Today's Date: 06/23/2019 Time: [id number] SLP Time Calculation (min) (ACUTE ONLY): 17 min  Assessment / Plan / Recommendation Clinical Impression  BSE order received/acknowledged and pt is already on caseload.  Pt was seen for dysphagia treatment and he presents with improved LOA today based on previous ST notes.  He was awake/alert throughout this tx session and was eager to consume PO trials.  Pt's oral cavity was observed to be dry with some secretions on his palate and in his bilateral buccal cavities.  Pt was observed with trials of ice chips, thin liquid (tsp/straw), puree, and regular solids.  He exhibited good bolus acceptance with all trials and he was observed to have consistent hyolaryngeal elevation/excursion.  No overt s/sx of aspiration were observed with any trials; however observed audible swallows and multiple swallows per bolus with straw sips of thin liquid.  Per previous ST notes, there are concerns for silent aspiration.  Pt exhibited mildly prolonged AP transport with pureed solids and prolonged mastication and bolus formation with regular solids.  Moderate oral residue mixed with sections was observed following minimal regular solid trials.  Pt was able to clear residue with a cued liquid wash.  Pt is not appropriate for a full PO diet at this time; however, he may have small ice chips or small sips of water PRN after oral care and snacks of puree from the floor stock.  He may also have essential PO medications crushed in puree with full supervision to ensure oral clearance.  SLP will f/u to determine readiness for initiation of PO diet vs instrumental swallow study.     HPI HPI: 79 y.o male w/ hx of CVA, MI, HTN, HLD, bronchitis, GERD, DM, B cataracts, back pain. Pt also w/ hx of shoulder surgery, refractive surgery abd cardiac cath. pt presented to hospital w/ c/o  progressiverly worsening SOB over a week and was dx with acute hypoxic respiratory failure due to COVID 19 PNA. Underwent bedside swallow eval with ST and doung to have no s/s aspiration, O2 sats stable, no supplemental O2 and RR stable. On 12/27 noted to have increased O2 needs after working with OT. MD note states "clinically it appears pt has likely aspirated 12/27" and swallow eval re-ordered. CXR 12/28 Interval increase in ill-defined opacity throughout both lungs, likely reflecting multifocal pneumonia.      SLP Plan  Continue with current plan of care       Recommendations  Diet recommendations: Other(comment)(Snacks of puree and thin liquid following oral care ) Liquids provided via: Cup;Straw Medication Administration: Crushed with puree Supervision: Full supervision/cueing for compensatory strategies;Staff to assist with self feeding Compensations: Slow rate;Small sips/bites Postural Changes and/or Swallow Maneuvers: Seated upright 90 degrees                Oral Care Recommendations: Oral care QID Follow up Recommendations: Other (comment)(TBD) SLP Visit Diagnosis: Dysphagia, unspecified (R13.10) Plan: Continue with current plan of care                     Colin Mulders M.S., [place] Office: [phone]  [place] 06/23/2019, 3:01 PM

## 2019-06-23 NOTE — Progress Notes (Addendum)
[facility] Progress Note    Assessment/ Plan:   1. AoCKD: followed by me Hollie Salk) for CKD.  Latest Cr was around 3 before the holidays.  Admitted 12/24 w/ creat 3.6 which worsened up to 6-7 range. UA and renal US were negative and pt had to be initiated on CRRT.  D/c'd 1/12021,06/23/2019.  Lokelma for mild hyperK, have added BID dosing 2. DM2 3. HTN 4. COVID+ PNA 5. HD cath site oozing- hgb down to 8.4.  S/p DDAVP, thrombipads, IR has seen 12/31. Recommended suture which pt declined. 6. Acute bilateral DVT- Coumadin on hold for now d/t HD cath oozing.    Addend: Coretrak clogged.  Can't get lokelma.  Will give ca, bicarb, 15 g Kayex PR.  HD to be completed this evening.    Subjective:    Bumped from HD schedule yesterday, on the schedule for today 06/23/2019.     Objective:   BP (!) 124/59 (BP Location: Right Arm)   Pulse 73   Temp 97.7 F (36.5 C)   Resp 18   Ht 6\' 1"  (1.854 m)   Wt 67 kg   SpO2 98%   BMI 19.49 kg/m   Intake/Output Summary (Last 24 hours) at 06/23/2019 1307 Last data filed at 06/23/2019 0600 Gross per 24 hour  Intake 170 ml  Output --  Net 170 ml   Weight change: 0 kg  Physical Exam: GEN: sitting in bed, a little confused HEENT EOMI PERRL NECK no JVD PULM bilateral rhonchi CV RRR ABD soft EXT trace edema NEURO AAO x 3 ACCESS: R IJ TDC with thrombi-pads in place, dried blood but doesn't appear to be fresh blood   Imaging: DG Abd Portable 1V  Result Date: 06/22/2019 CLINICAL DATA:  Ileus.  COVID-19 positive. EXAM: PORTABLE ABDOMEN - 1 VIEW COMPARISON:  One day prior and back to 06/18/2019. FINDINGS: Single supine view of the abdomen and pelvis. Resolution of previously described gas filled small bowel loops. Bowel gas pattern currently within normal limits. No abnormal abdominal calcifications. No appendicolith. Distal gas. IMPRESSION: No acute findings.  Resolved adynamic ileus. Electronically Signed   By: Abigail Miyamoto M.D.   On:  06/22/2019 12:46    Labs: BMET Recent Labs  Lab 06/19/19 1802 06/20/19 0453 06/20/19 1600 06/21/19 0448 06/21/19 1840 06/22/19 0803 06/23/19 0656  NA 142 142 140 138 136 138 142  K 4.5 4.7 5.6* 5.1 4.7 5.7* 5.7*  CL 107 106 104 103 99 104 104  CO2 18* 22 19* 23 24 22 22   GLUCOSE 161* 159* 242* 229* 243* 336* 216*  BUN 97* 65* 62* 51* 42* 80* 119*  CREATININE 5.65* 4.00* 3.85* 3.01* 2.28* 4.43* 6.42*  CALCIUM 6.3* 6.9* 6.8* 6.8* 6.9* 6.5* 6.5*  PHOS 5.3* 4.4 5.9* 5.6* 5.3*  --   --    CBC Recent Labs  Lab 06/17/19 0400 06/18/19 0520 06/19/19 0409 06/20/19 0453 06/21/19 0448 06/22/19 0803 06/23/19 0656  WBC 19.1* 20.5* 18.9* 15.2* 11.6* 11.7* 12.1*  NEUTROABS 17.4* 18.6* 17.5*  --   --   --  10.5*  HGB 13.2 13.0 10.8* 10.2* 8.4* 8.1* 8.6*  HCT 41.7 40.4 33.2* 31.4* 26.5* 25.1* 27.3*  MCV 81.3 80.3 79.2* 78.3* 79.6* 81.2 83.2  PLT 322 365 311 313 200 148* 137*    Medications:    . albuterol  2 puff Inhalation Q6H  . vitamin C  500 mg Oral Daily  . B-complex with vitamin C  1 tablet Per Tube Daily  .  chlorhexidine  15 mL Mouth Rinse BID  . Chlorhexidine Gluconate Cloth  6 each Topical Q0600  . feeding supplement (PRO-STAT SUGAR FREE 64)  30 mL Per Tube Daily  . heparin  3.2 mL Intravenous Once  . insulin aspart  0-9 Units Subcutaneous Q4H  . mouth rinse  15 mL Mouth Rinse q12n4p  . rosuvastatin  40 mg Oral Daily  . sodium chloride flush  3 mL Intravenous Q12H  . sodium zirconium cyclosilicate  10 g Oral Once  . sodium zirconium cyclosilicate  10 g Oral BID  . warfarin  2 mg Oral ONCE-1800  . Warfarin - Pharmacist Dosing Inpatient   Does not apply q1800  . zinc sulfate  220 mg Oral Daily      Madelon Lips, MD [facility] pgr [phone] 06/23/2019, 1:07 PM

## 2019-06-23 NOTE — Progress Notes (Signed)
ANTICOAGULATION CONSULT NOTE - Follow Up Consult  Pharmacy Consult for Coumadin Indication: DVT  No Known Allergies  Patient Measurements: Height: 6\' 1"  (185.4 cm) Weight: 147 lb 11.3 oz (67 kg) IBW/kg (Calculated) : 79.9   Vital Signs: Temp: 97.7 F (36.5 C) (01/03 0349) Temp Source: Oral (01/03 0000) BP: 120/52 (01/03 0200) Pulse Rate: 78 (01/03 0200)  Labs: Recent Labs    06/21/19 0448 06/21/19 1840 06/22/19 0803 06/23/19 0656  HGB 8.4*  --  8.1* 8.6*  HCT 26.5*  --  25.1* 27.3*  PLT 200  --  148* 137*  LABPROT 29.5*  --  28.5* 24.5*  INR 2.8*  --  2.7* 2.2*  CREATININE 3.01* 2.28* 4.43*  --     Estimated Creatinine Clearance: 13 mL/min (A) (by C-G formula based on SCr of 4.43 mg/dL (H)).  Assessment: 79 yo male transferred from [place] to [facility] on 12/29 and now transferred out of the ICU. Doppler on 12/24  found right lower extremity acute DVT and findings consistent with acute superficial vein thrombosis in left small saphenous vein on left lower extremity. No anticoagulation prior to admission. Pharmacy consulted to dose warfarin for VTE treatment and patient was started on warfarin on 12/26. Warfarin was held on 12/30-1/1 for oozing from dialysis catheter site and for drop in hemoglobin. Warfarin 2mg  was ordered on 1/2 but not documented as administered in [facility]. Spoke to RN this morning and she informed me that patient's cortrak was clogged and won't be fixed this weekend and patient is not permitted to take any medications by mouth - this is likely why warfarin was not administered last night as ordered. RN also reports continued bleeding at HD catheter site which was described as the same or slightly decreased. Hgb 8.6. Plt 137.   Spoke with Dr. Tyrell Antonio and INR is still therapeutic at 2.2 and would hesitate on starting heparin if INR <2 given bleeding at HD site. Dr. Tyrell Antonio consulted speech to see if able to take warfarin orally tonight. And confirmed bleeding at HD  catheter site.   Goal of Therapy:  INR 2-3 Heparin level 0.3-0.7 units/ml Monitor platelets by anticoagulation protocol: Yes   Plan:  Warfarin 2.5mg  ordered for tonight if patient able to take oral medications (RN aware)  Follow up ability to fix cortrak tomorrow and resuming warfarin  Monitor daily INR and CBC  Pharmacy to provide patient education   Cristela Felt, PharmD PGY1 Pharmacy Resident [employer]: [phone]  06/23/2019,9:23 AM

## 2019-06-23 NOTE — Progress Notes (Addendum)
  Steve Martinez is a 79 y.o. male patient transfer from ICU awake, alert  & orientated  X 3,  Full Code, VSS - Blood pressure (!) 120/52, pulse 78, temperature 97.7 F (36.5 C), resp. rate 18, height 6\' 1"  (1.854 m), weight 67 kg, SpO2 99 %., O2 4L [place]  Pt orientation to unit, room and routine. Information packet given to patient/family and safety video watched.  Admission INP armband ID verified with patient/family, and in place. Pt verbalizes an understanding of how to use the call bell and to call for help before getting out of bed.  Skin, clean-dry- intact without evidence of bruising, or skin tears.  No evidence of skin break down noted on exam.   Will cont to monitor and assist as needed.

## 2019-06-23 NOTE — Progress Notes (Signed)
Per MD ok to push 5cc ns through cortrak to try and unclog tube. RN attempted 3 times without success. Speech therapy paged.

## 2019-06-23 NOTE — Progress Notes (Signed)
PROGRESS NOTE    Steve Martinez  [id number] DOB: Jul 20, 1940 DOA: 05/23/2019 PCP: Gaynelle Arabian, MD   Brief Narrative: 79 year old with past medical history significant for hypertension, hyperlipidemia, diabetes type 2, CKD, CVA with left side deficit/weakness admitted 12/23 to [facility] after progressive 1 week history of worsening shortness of breath, found to be in acute hypoxic respiratory failure and COVID-19 positive.  Patient was started on oxygen , Decadron and remdesivir with aggressive pulmonary hygiene and self proning.  Hospitalization complicated by aspiration event on 12/27 and started on Unasyn, acute bilateral leg DVT started on heparin drip and worsening AKI with nephrology consultation 12/28.  Patient was transferred to [facility] on 12/29 and underwent placement of tunneled hemodialysis catheter with plans for intermittent dialysis.  However blood pressure continued to decline with concerns for worsening sepsis.  PCCM consulted for closer monitoring in ICU.  Due to intolerance to hemodialysis (patient became unresponsive) patient underwent CRRT.  Subsequently patient was a started on hemodialysis and has tolerated it.  Assessment & Plan:   Active Problems:   COVID-19   Type 2 diabetes mellitus with hyperglycemia (HCC)   Acute kidney injury ([place])   Essential hypertension   Hypokalemia   Prolonged QT interval   Hyperlipidemia   Acute on chronic respiratory failure with hypoxia (HCC)   ESRD (end stage renal disease) (HCC)   Hypoxia  1-Acute Hypoxic Respiratory failure secondary to COVID-19 pneumonitis complicated by aspiration pneumonia with possible sepsis: -Received 3 days of Unasyn.  -Completed Remdesivir and decadron.  -Received one dose of Actemra.  -Oxygen sat on 3 L.   2-Acute on chronic kidney disease a stage V now on hemodialysis: Didn't tolerated HD initially and received CRRT.  Now tolerating HD>   3-Dysphagia: Core track, not working.  Fluoro and Cortrack   team not available.  Speech to follow on patient.   Hypotension: resolved. Stable   Bilateral DVT ; on Coumadin: INR at 2.2.  Would avoid starting heparin gtt due to recent bleeding from HD catheter site.  cortrack is not working, will ask speech to evaluate patient to see if he is able to swallow pills.   Bleeding from hemodialysis catheter site subclavian status post DDAVP on January 06-2019; -IR evaluated site at bedside 12/31, recommended placement of suture at insertion site but patient refused -MAR reveals status post DDAVP June 21, 2019 at 6 PM -Will ask IR to evaluate patient again. They will likely need to contact family.   Hyperkalemia; nephrology inform of inability to give [facility] to patient.  For HD today.   Diabetes type 2, poorly controlled: Hemoglobin A1c 9.6  DM type 2; Hold Q 4 hours SSI schedule, tubes feeding on hold.  SSI.   Nutrition Problem: Inadequate oral intake Etiology: acute illness, dysphagia    Signs/Symptoms: NPO status    Interventions: Tube feeding  Estimated body mass index is 19.49 kg/m as calculated from the following:   Height as of this encounter: 6\' 1"  (1.854 m).   Weight as of this encounter: 67 kg.   DVT prophylaxis: on coumadin Code Status: full code Family Communication:  Disposition Plan: remain in the hospital new HD Consultants:  Nephrology 12/28 PCCM 12/29  Procedures:  12/29 Right TDC >> 12/24 DVT BLE [place] >> Right: Findings consistent with acute deep vein thrombosis involving the right peroneal veins. No cystic structure found in the popliteal fossa. Left: Findings consistent with acute superficial vein thrombosis involving the left small saphenous vein. No cystic structure found in  the popliteal fossa.  12/26 renal US >> No acute renal findings and negative urinary bladder. Chronic medical renal disease suspected.  12/27 TTE >>  1. Left ventricular ejection fraction, by visual estimation, is >75%. The  left ventricle has hyperdynamic function. There is no left ventricular hypertrophy. 2. Left ventricular diastolic parameters are normal for age. The "impaired relaxation" pattern of mitral inflow is probably due to low left atrial pressures ("underfilling") and increased cardiac output. 3. The left ventricle has no regional wall motion abnormalities. 4. Global right ventricle has hyperdynamic systolic function.The right ventricular size is normal. No increase in right ventricular wall thickness. 5. Left atrial size was normal. 6. Right atrial size was normal. 7. The mitral valve is normal in structure. No evidence of mitral valve regurgitation. 8. The tricuspid valve is normal in structure. 9. The aortic valve is normal in structure. Aortic valve regurgitation is not visualized. Mild aortic valve sclerosis without stenosis. 10. The pulmonic valve was grossly normal. Pulmonic valve regurgitation is not visualized. 11. Normal pulmonary artery systolic pressure. 12. The inferior vena cava is collapsed, consistent with low right atrial pressure.   Antimicrobials:  Amp/Sulbactam 12/27>> Ceftriaxone 12/23 x 1 dose Doxycycline 12/23 x 1 dose Remdesivir 12/23>>12/27 Decadron 12/23>> x10 days Tocilizumab 12/30  X 1  Subjective: Alert, not very interactive  Objective: Vitals:   06/23/19 0100 06/23/19 0200 06/23/19 0349 06/23/19 0500  BP: (!) 126/53 (!) 120/52    Pulse: 76 78    Resp: 18 18    Temp:   97.7 F (36.5 C)   TempSrc:      SpO2: 98% 99%    Weight:    67 kg  Height:        Intake/Output Summary (Last 24 hours) at 06/23/2019 0720 Last data filed at 06/23/2019 0600 Gross per 24 hour  Intake 290 ml  Output --  Net 290 ml   Filed Weights   06/21/19 0453 06/22/19 0500 06/23/19 0500  Weight: 71.5 kg 67 kg 67 kg    Examination:  General exam: chronic ill appearing Respiratory system: Clear to auscultation. Respiratory effort normal. Chest HD with dressing, old  blood Cardiovascular system: S1 & S2 heard, RRR. No JVD, murmurs, rubs, gallops or clicks. No pedal edema. Gastrointestinal system: Abdomen is nondistended, soft and nontender. No organomegaly or masses felt. Normal bowel sounds heard. Central nervous system: Alert  Extremities: Symmetric 5 x 5 power. Skin: No rashes, lesions or ulcers    Data Reviewed: I have personally reviewed following labs and imaging studies  CBC: Recent Labs  Lab 06/17/19 0400 06/18/19 0520 06/19/19 0409 06/20/19 0453 06/21/19 0448 06/22/19 0803  WBC 19.1* 20.5* 18.9* 15.2* 11.6* 11.7*  NEUTROABS 17.4* 18.6* 17.5*  --   --   --   HGB 13.2 13.0 10.8* 10.2* 8.4* 8.1*  HCT 41.7 40.4 33.2* 31.4* 26.5* 25.1*  MCV 81.3 80.3 79.2* 78.3* 79.6* 81.2  PLT 322 365 311 313 200 123456*   Basic Metabolic Panel: Recent Labs  Lab 06/19/19 1802 06/20/19 0453 06/20/19 1600 06/21/19 0448 06/21/19 1840 06/22/19 0803  NA 142 142 140 138 136 138  K 4.5 4.7 5.6* 5.1 4.7 5.7*  CL 107 106 104 103 99 104  CO2 18* 22 19* 23 24 22   GLUCOSE 161* 159* 242* 229* 243* 336*  BUN 97* 65* 62* 51* 42* 80*  CREATININE 5.65* 4.00* 3.85* 3.01* 2.28* 4.43*  CALCIUM 6.3* 6.9* 6.8* 6.8* 6.9* 6.5*  MG 2.6* 2.6* 2.7* 2.6*  --  3.2*  PHOS 5.3* 4.4 5.9* 5.6* 5.3*  --    GFR: Estimated Creatinine Clearance: 13 mL/min (A) (by C-G formula based on SCr of 4.43 mg/dL (H)). Liver Function Tests: Recent Labs  Lab 06/17/19 0400 06/19/19 0409 06/20/19 0453 06/20/19 1600 06/21/19 0448 06/21/19 1840 06/22/19 0803  AST 54* 59* 64*  --  70*  --  49*  ALT 31 25 26   --  25  --  22  ALKPHOS 123 168* 187*  --  135*  --  86  BILITOT 0.8 0.7 0.9  --  0.8  --  0.6  PROT 7.2 6.3* 6.7  --  6.0*  --  5.7*  ALBUMIN 2.2* 1.6* 1.6* 1.5* 1.6* 1.6* 1.6*   No results for input(s): LIPASE, AMYLASE in the last 168 hours. No results for input(s): AMMONIA in the last 168 hours. Coagulation Profile: Recent Labs  Lab 06/18/19 0520 06/19/19 0409  06/20/19 0453 06/21/19 0448 06/22/19 0803  INR 2.5* 3.1* 2.6* 2.8* 2.7*   Cardiac Enzymes: No results for input(s): CKTOTAL, CKMB, CKMBINDEX, TROPONINI in the last 168 hours. BNP (last 3 results) No results for input(s): PROBNP in the last 8760 hours. HbA1C: No results for input(s): HGBA1C in the last 72 hours. CBG: Recent Labs  Lab 06/22/19 1605 06/22/19 2006 06/23/19 0006 06/23/19 0350 06/23/19 0554  GLUCAP 218* 286* 267* 161* 177*   Lipid Profile: No results for input(s): CHOL, HDL, LDLCALC, TRIG, CHOLHDL, LDLDIRECT in the last 72 hours. Thyroid Function Tests: No results for input(s): TSH, T4TOTAL, FREET4, T3FREE, THYROIDAB in the last 72 hours. Anemia Panel: No results for input(s): VITAMINB12, FOLATE, FERRITIN, TIBC, IRON, RETICCTPCT in the last 72 hours. Sepsis Labs: Recent Labs  Lab 06/16/19 1655 06/17/19 0400 06/18/19 0520 06/18/19 1945 06/18/19 2132  PROCALCITON 1.23 1.66 2.19  --   --   LATICACIDVEN  --   --   --  2.2* 1.5    Recent Results (from the past 240 hour(s))  MRSA PCR Screening     Status: None   Collection Time: 06/19/19  6:28 AM   Specimen: Nasopharyngeal  Result Value Ref Range Status   MRSA by PCR NEGATIVE NEGATIVE Final    Comment:        The GeneXpert MRSA Assay (FDA approved for NASAL specimens only), is one component of a comprehensive MRSA colonization surveillance program. It is not intended to diagnose MRSA infection nor to guide or monitor treatment for MRSA infections. Performed at [facility], [place] [address], [place], [place] [address]          Radiology Studies: DG Abd Portable 1V  Result Date: 06/22/2019 CLINICAL DATA:  Ileus.  COVID-19 positive. EXAM: PORTABLE ABDOMEN - 1 VIEW COMPARISON:  One day prior and back to 06/18/2019. FINDINGS: Single supine view of the abdomen and pelvis. Resolution of previously described gas filled small bowel loops. Bowel gas pattern currently within normal limits. No abnormal  abdominal calcifications. No appendicolith. Distal gas. IMPRESSION: No acute findings.  Resolved adynamic ileus. Electronically Signed   By: Abigail Miyamoto M.D.   On: 06/22/2019 12:46        Scheduled Meds: . albuterol  2 puff Inhalation Q6H  . vitamin C  500 mg Oral Daily  . B-complex with vitamin C  1 tablet Per Tube Daily  . chlorhexidine  15 mL Mouth Rinse BID  . Chlorhexidine Gluconate Cloth  6 each Topical Q0600  . feeding supplement (PRO-STAT SUGAR FREE 64)  30 mL  Per Tube Daily  . heparin  3.2 mL Intravenous Once  . insulin aspart  0-9 Units Subcutaneous Q4H  . insulin aspart  3 Units Subcutaneous Q4H  . mouth rinse  15 mL Mouth Rinse q12n4p  . rosuvastatin  40 mg Oral Daily  . sodium chloride flush  3 mL Intravenous Q12H  . sodium zirconium cyclosilicate  10 g Oral Once  . sodium zirconium cyclosilicate  10 g Oral BID  . warfarin  2 mg Oral ONCE-1800  . Warfarin - Pharmacist Dosing Inpatient   Does not apply q1800  . zinc sulfate  220 mg Oral Daily   Continuous Infusions: . sodium chloride    . sodium chloride    . famotidine (PEPCID) IV 20 mg (06/22/19 0930)  . feeding supplement (VITAL 1.5 CAL) 1,000 mL (06/20/19 1934)     LOS: 11 days    Time spent: 35 minutes.     Elmarie Shiley, MD [facility]   If 7PM-7AM, please contact night-coverage [email or web address] Password [email or web address] 06/23/2019, 7:20 AM

## 2019-06-24 ENCOUNTER — Inpatient Hospital Stay (HOSPITAL_COMMUNITY): Payer: Medicare HMO | Admitting: Anesthesiology

## 2019-06-24 ENCOUNTER — Inpatient Hospital Stay (HOSPITAL_COMMUNITY): Payer: Medicare HMO

## 2019-06-24 DIAGNOSIS — R4182 Altered mental status, unspecified: Secondary | ICD-10-CM

## 2019-06-24 LAB — TROPONIN I (HIGH SENSITIVITY)
Troponin I (High Sensitivity): 251 ng/L (ref <18)
Troponin I (High Sensitivity): 279 ng/L (ref <18)

## 2019-06-24 LAB — BASIC METABOLIC PANEL
Anion gap: 20 — ABNORMAL HIGH (ref 5–15)
Anion gap: 12 (ref 5–15)
BUN: 102 mg/dL — ABNORMAL HIGH (ref 8–23)
BUN: 70 mg/dL — ABNORMAL HIGH (ref 8–23)
CO2: 20 mmol/L — ABNORMAL LOW (ref 22–32)
CO2: 21 mmol/L — ABNORMAL LOW (ref 22–32)
Calcium: 6.4 mg/dL — CL (ref 8.9–10.3)
Calcium: 6.4 mg/dL — CL (ref 8.9–10.3)
Chloride: 102 mmol/L (ref 98–111)
Chloride: 105 mmol/L (ref 98–111)
Creatinine, Ser: 6.26 mg/dL — ABNORMAL HIGH (ref 0.61–1.24)
Creatinine, Ser: 3.93 mg/dL — ABNORMAL HIGH (ref 0.61–1.24)
GFR calc Af Amer: 9 mL/min — ABNORMAL LOW (ref >60)
GFR calc Af Amer: 16 mL/min — ABNORMAL LOW (ref >60)
GFR calc non Af Amer: 8 mL/min — ABNORMAL LOW (ref >60)
GFR calc non Af Amer: 14 mL/min — ABNORMAL LOW (ref >60)
Glucose, Bld: 232 mg/dL — ABNORMAL HIGH (ref 70–99)
Glucose, Bld: 188 mg/dL — ABNORMAL HIGH (ref 70–99)
Potassium: 6.2 mmol/L — ABNORMAL HIGH (ref 3.5–5.1)
Potassium: 6 mmol/L — ABNORMAL HIGH (ref 3.5–5.1)
Sodium: 142 mmol/L (ref 135–145)
Sodium: 138 mmol/L (ref 135–145)

## 2019-06-24 LAB — CBC WITH DIFFERENTIAL/PLATELET
Abs Immature Granulocytes: 0.06 10*3/uL (ref 0.00–0.07)
Basophils Absolute: 0 10*3/uL (ref 0.0–0.1)
Basophils Relative: 0 %
Eosinophils Absolute: 0 10*3/uL (ref 0.0–0.5)
Eosinophils Relative: 0 %
HCT: 24.3 % — ABNORMAL LOW (ref 39.0–52.0)
Hemoglobin: 7.8 g/dL — ABNORMAL LOW (ref 13.0–17.0)
Immature Granulocytes: 1 %
Lymphocytes Relative: 10 %
Lymphs Abs: 0.8 10*3/uL (ref 0.7–4.0)
MCH: 26.4 pg (ref 26.0–34.0)
MCHC: 32.1 g/dL (ref 30.0–36.0)
MCV: 82.1 fL (ref 80.0–100.0)
Monocytes Absolute: 0.2 10*3/uL (ref 0.1–1.0)
Monocytes Relative: 3 %
Neutro Abs: 6.9 10*3/uL (ref 1.7–7.7)
Neutrophils Relative %: 86 %
Platelets: 93 10*3/uL — ABNORMAL LOW (ref 150–400)
RBC: 2.96 MIL/uL — ABNORMAL LOW (ref 4.22–5.81)
RDW: 16.3 % — ABNORMAL HIGH (ref 11.5–15.5)
WBC: 8 10*3/uL (ref 4.0–10.5)
nRBC: 0 % (ref 0.0–0.2)

## 2019-06-24 LAB — POCT I-STAT 7, (LYTES, BLD GAS, ICA,H+H)
Acid-base deficit: 4 mmol/L — ABNORMAL HIGH (ref 0.0–2.0)
Bicarbonate: 21.6 mmol/L (ref 20.0–28.0)
Calcium, Ion: 0.84 mmol/L — CL (ref 1.15–1.40)
HCT: 18 % — ABNORMAL LOW (ref 39.0–52.0)
Hemoglobin: 6.1 g/dL — CL (ref 13.0–17.0)
O2 Saturation: 88 %
Potassium: 6.1 mmol/L — ABNORMAL HIGH (ref 3.5–5.1)
Sodium: 138 mmol/L (ref 135–145)
TCO2: 23 mmol/L (ref 22–32)
pCO2 arterial: 39.6 mmHg (ref 32.0–48.0)
pH, Arterial: 7.344 — ABNORMAL LOW (ref 7.350–7.450)
pO2, Arterial: 58 mmHg — ABNORMAL LOW (ref 83.0–108.0)

## 2019-06-24 LAB — RENAL FUNCTION PANEL
Albumin: 1.6 g/dL — ABNORMAL LOW (ref 3.5–5.0)
Anion gap: 18 — ABNORMAL HIGH (ref 5–15)
BUN: 82 mg/dL — ABNORMAL HIGH (ref 8–23)
CO2: 19 mmol/L — ABNORMAL LOW (ref 22–32)
Calcium: 6.6 mg/dL — ABNORMAL LOW (ref 8.9–10.3)
Chloride: 102 mmol/L (ref 98–111)
Creatinine, Ser: 4.58 mg/dL — ABNORMAL HIGH (ref 0.61–1.24)
GFR calc Af Amer: 13 mL/min — ABNORMAL LOW (ref >60)
GFR calc non Af Amer: 11 mL/min — ABNORMAL LOW (ref >60)
Glucose, Bld: 201 mg/dL — ABNORMAL HIGH (ref 70–99)
Phosphorus: 8.9 mg/dL — ABNORMAL HIGH (ref 2.5–4.6)
Potassium: 6.4 mmol/L (ref 3.5–5.1)
Sodium: 139 mmol/L (ref 135–145)

## 2019-06-24 LAB — PROTIME-INR
INR: 2.3 — ABNORMAL HIGH (ref 0.8–1.2)
INR: 2.6 — ABNORMAL HIGH (ref 0.8–1.2)
Prothrombin Time: 25 seconds — ABNORMAL HIGH (ref 11.4–15.2)
Prothrombin Time: 28.1 seconds — ABNORMAL HIGH (ref 11.4–15.2)

## 2019-06-24 LAB — GLUCOSE, CAPILLARY
Glucose-Capillary: 171 mg/dL — ABNORMAL HIGH (ref 70–99)
Glucose-Capillary: 202 mg/dL — ABNORMAL HIGH (ref 70–99)
Glucose-Capillary: 239 mg/dL — ABNORMAL HIGH (ref 70–99)
Glucose-Capillary: 201 mg/dL — ABNORMAL HIGH (ref 70–99)
Glucose-Capillary: 180 mg/dL — ABNORMAL HIGH (ref 70–99)
Glucose-Capillary: 157 mg/dL — ABNORMAL HIGH (ref 70–99)

## 2019-06-24 LAB — PREPARE RBC (CROSSMATCH)

## 2019-06-24 LAB — LACTIC ACID, PLASMA: Lactic Acid, Venous: 6.9 mmol/L (ref 0.5–1.9)

## 2019-06-24 LAB — APTT: aPTT: 47 seconds — ABNORMAL HIGH (ref 24–36)

## 2019-06-24 MED ORDER — FENTANYL BOLUS VIA INFUSION
25.0000 ug | INTRAVENOUS | Status: DC | PRN
  Filled 2019-06-24: qty 25

## 2019-06-24 MED ORDER — NOREPINEPHRINE 4 MG/250ML-% IV SOLN
0.0000 ug/min | INTRAVENOUS | Status: DC
  Administered 2019-06-24: 18 ug/min via INTRAVENOUS
  Administered 2019-06-24: 21:00:00 21 ug/min via INTRAVENOUS
  Administered 2019-06-24: 2 ug/min via INTRAVENOUS
  Filled 2019-06-24 (×3): qty 250

## 2019-06-24 MED ORDER — CHLORHEXIDINE GLUCONATE 0.12% ORAL RINSE (MEDLINE KIT)
15.0000 mL | Freq: Two times a day (BID) | OROMUCOSAL | Status: DC
  Administered 2019-06-24 – 2019-07-01 (×14): 15 mL via OROMUCOSAL

## 2019-06-24 MED ORDER — CALCIUM GLUCONATE-NACL 2-0.675 GM/100ML-% IV SOLN
2.0000 g | Freq: Once | INTRAVENOUS | Status: DC
  Filled 2019-06-24: qty 100

## 2019-06-24 MED ORDER — LACTATED RINGERS IV BOLUS
1000.0000 mL | Freq: Once | INTRAVENOUS | Status: AC
  Administered 2019-06-24: 1000 mL via INTRAVENOUS

## 2019-06-24 MED ORDER — ORAL CARE MOUTH RINSE
15.0000 mL | OROMUCOSAL | Status: DC
  Administered 2019-06-24 – 2019-07-01 (×69): 15 mL via OROMUCOSAL

## 2019-06-24 MED ORDER — SODIUM CHLORIDE 0.9 % IV SOLN: INTRAVENOUS | Status: DC

## 2019-06-24 MED ORDER — SODIUM CHLORIDE 0.9 % IV SOLN: INTRAVENOUS | Status: DC | PRN

## 2019-06-24 MED ORDER — PRISMASOL BGK 4/2.5 32-4-2.5 MEQ/L IV SOLN
INTRAVENOUS | Status: DC
  Filled 2019-06-24 (×9): qty 5000

## 2019-06-24 MED ORDER — MIDAZOLAM 50MG/50ML (1MG/ML) PREMIX INFUSION: 2.0000 mg/h | INTRAVENOUS | Status: DC

## 2019-06-24 MED ORDER — PRISMASOL BGK 4/2.5 32-4-2.5 MEQ/L REPLACEMENT SOLN
Status: DC
  Filled 2019-06-24 (×18): qty 5000

## 2019-06-24 MED ORDER — PANTOPRAZOLE SODIUM 40 MG IV SOLR
40.0000 mg | Freq: Every day | INTRAVENOUS | Status: DC
  Administered 2019-06-24 – 2019-06-26 (×3): 40 mg via INTRAVENOUS
  Filled 2019-06-24 (×3): qty 40

## 2019-06-24 MED ORDER — PRISMASOL BGK 0/2.5 32-2.5 MEQ/L REPLACEMENT SOLN
Status: DC
  Filled 2019-06-24 (×6): qty 5000

## 2019-06-24 MED ORDER — MIDAZOLAM BOLUS VIA INFUSION
1.0000 mg | INTRAVENOUS | Status: DC | PRN
  Filled 2019-06-24: qty 1

## 2019-06-24 MED ORDER — "THROMBI-PAD 3""X3"" EX PADS"
1.0000 | MEDICATED_PAD | Freq: Once | CUTANEOUS | Status: AC
  Administered 2019-06-24: 1 via TOPICAL
  Filled 2019-06-24: qty 1

## 2019-06-24 MED ORDER — HEPARIN SODIUM (PORCINE) 1000 UNIT/ML IJ SOLN
INTRAMUSCULAR | Status: AC
  Filled 2019-06-24: qty 4

## 2019-06-24 MED ORDER — NOREPINEPHRINE 16 MG/250ML-% IV SOLN
0.0000 ug/min | INTRAVENOUS | Status: DC
  Administered 2019-06-24: 22 ug/min via INTRAVENOUS
  Administered 2019-06-25: 15 ug/min via INTRAVENOUS
  Administered 2019-06-25: 34 ug/min via INTRAVENOUS
  Administered 2019-06-27: 27 ug/min via INTRAVENOUS
  Administered 2019-06-28: 16 ug/min via INTRAVENOUS
  Administered 2019-06-29: 5 ug/min via INTRAVENOUS
  Filled 2019-06-24 (×9): qty 250

## 2019-06-24 MED ORDER — HEPARIN SODIUM (PORCINE) 1000 UNIT/ML DIALYSIS
1000.0000 [IU] | INTRAMUSCULAR | Status: DC | PRN
  Administered 2019-06-24: 4000 [IU] via INTRAVENOUS_CENTRAL
  Administered 2019-06-25: 3000 [IU] via INTRAVENOUS_CENTRAL
  Administered 2019-06-30: 3200 [IU] via INTRAVENOUS_CENTRAL
  Filled 2019-06-24 (×2): qty 6
  Filled 2019-06-24: qty 4
  Filled 2019-06-24: qty 6
  Filled 2019-06-24: qty 2
  Filled 2019-06-24 (×4): qty 6

## 2019-06-24 MED ORDER — SODIUM CHLORIDE 0.9% IV SOLUTION: Freq: Once | INTRAVENOUS | Status: AC

## 2019-06-24 MED ORDER — FENTANYL 2500MCG IN NS 250ML (10MCG/ML) PREMIX INFUSION
100.0000 ug/h | INTRAVENOUS | Status: DC
  Administered 2019-06-24: 10:00:00 100 ug/h via INTRAVENOUS
  Administered 2019-06-24: 250 ug/h via INTRAVENOUS
  Administered 2019-06-25: 115 ug/h via INTRAVENOUS
  Filled 2019-06-24 (×3): qty 250

## 2019-06-24 MED ORDER — CALCIUM GLUCONATE-NACL 2-0.675 GM/100ML-% IV SOLN
2.0000 g | Freq: Once | INTRAVENOUS | Status: AC
  Administered 2019-06-24: 2000 mg via INTRAVENOUS
  Filled 2019-06-24: qty 100

## 2019-06-24 MED ORDER — ASPIRIN 300 MG RE SUPP: 300.0000 mg | RECTAL | Status: DC

## 2019-06-24 MED ORDER — PRISMASOL BGK 0/2.5 32-2.5 MEQ/L IV SOLN
INTRAVENOUS | Status: DC
  Filled 2019-06-24 (×25): qty 5000

## 2019-06-24 MED ORDER — CALCIUM GLUCONATE-NACL 1-0.675 GM/50ML-% IV SOLN
1.0000 g | Freq: Once | INTRAVENOUS | Status: AC
  Administered 2019-06-24: 1000 mg via INTRAVENOUS
  Filled 2019-06-24: qty 50

## 2019-06-24 MED ORDER — MIDAZOLAM HCL 2 MG/2ML IJ SOLN: 1.0000 mg | Freq: Once | INTRAMUSCULAR | Status: AC

## 2019-06-24 MED ORDER — INSULIN ASPART 100 UNIT/ML ~~LOC~~ SOLN
0.0000 [IU] | SUBCUTANEOUS | Status: DC
  Administered 2019-06-24 (×2): 2 [IU] via SUBCUTANEOUS
  Administered 2019-06-25 (×2): 1 [IU] via SUBCUTANEOUS
  Administered 2019-06-26: 7 [IU] via SUBCUTANEOUS
  Administered 2019-06-26: 5 [IU] via SUBCUTANEOUS
  Administered 2019-06-26: 7 [IU] via SUBCUTANEOUS
  Administered 2019-06-26: 9 [IU] via SUBCUTANEOUS
  Administered 2019-06-26: 3 [IU] via SUBCUTANEOUS
  Administered 2019-06-26: 2 [IU] via SUBCUTANEOUS
  Administered 2019-06-27: 3 [IU] via SUBCUTANEOUS
  Administered 2019-06-27 (×2): 5 [IU] via SUBCUTANEOUS
  Administered 2019-06-27: 3 [IU] via SUBCUTANEOUS
  Administered 2019-06-27: 5 [IU] via SUBCUTANEOUS
  Administered 2019-06-28: 1 [IU] via SUBCUTANEOUS
  Administered 2019-06-28: 3 [IU] via SUBCUTANEOUS
  Administered 2019-06-29 (×4): 5 [IU] via SUBCUTANEOUS
  Administered 2019-06-29: 3 [IU] via SUBCUTANEOUS
  Administered 2019-06-29: 2 [IU] via SUBCUTANEOUS
  Administered 2019-06-29 – 2019-06-30 (×2): 5 [IU] via SUBCUTANEOUS
  Administered 2019-06-30: 3 [IU] via SUBCUTANEOUS

## 2019-06-24 MED ORDER — FENTANYL CITRATE (PF) 100 MCG/2ML IJ SOLN
50.0000 ug | Freq: Once | INTRAMUSCULAR | Status: AC
  Administered 2019-06-24: 50 ug via INTRAVENOUS
  Filled 2019-06-24: qty 2

## 2019-06-24 MED ORDER — CHLORHEXIDINE GLUCONATE CLOTH 2 % EX PADS
6.0000 | MEDICATED_PAD | Freq: Every morning | CUTANEOUS | Status: DC
  Administered 2019-06-25 – 2019-07-01 (×7): 6 via TOPICAL

## 2019-06-24 MED FILL — Medication: Qty: 1 | Status: AC

## 2019-06-24 NOTE — Progress Notes (Signed)
NAME:  Steve Martinez, MRN:  [id number], DOB:  [birth year], LOS: 12 ADMISSION DATE:  05/27/2019, CONSULTATION DATE:  06/18/2019 REFERRING MD:  Dr. Candiss Norse, CHIEF COMPLAINT:  Hypotension  Brief History    79 year old male with prior history of HTN, HLD, DMT2, GERD, CKD, CVA with left sided deficits/weakness admitted 12/23 to [facility] after progressive one weak history of worsening shortness of breath found to be in acute hypoxic respiratory failure and COVID- 19 positive after known exposure to wife with COVID.   He was admitted to [facility] and started on oxygen [place], decadron, and remdesivir with aggressive pulmonary hygiene and self proning.  Hospitalization complicated by aspiration event on 12/27 and started on unasyn, acute bilateral leg DVT started on heparin gtt, and worsening AKI with nephrology consulted 12/28.  Patient was transferred to [facility] on 12/29 and underwent placement of tunneled hemodialysis catheter with plans for intermittent dialysis. However, blood pressure continued to decline with concerns for worsening sepsis.  PCCM consulted for closer monitoring in ICU.   Past Medical History  HTN, HLD, DMT2, GERD, CVA with left sided deficits/weakness, MI, CKD  Significant Hospital Events   12/23 Ad,itted [facility] 12/29 transfer [facility]/ TDC placement 12/30 did not tolerated HD (became unresponsive), started on CRRT, tocilizumab x 1 1/1 - oozing from HD cath  + - Lying in bed states he feels well but appears weak. Tolerating CRRT, 328ml removed.  1/2 transfer out of ICU 1/4 PEA arrest on floor post HD, intubated and transferred back to ICU  Consults:  Nephrology 12/28 PCCM 12/29  Procedures:  12/29 Right TDC >> 1/4 ETT >  1/4 CVL pending >   Significant Diagnostic Tests:  12/24 DVT BLE [place] >> Right: Findings consistent with acute deep vein thrombosis involving the right peroneal veins. No cystic structure found in the popliteal fossa. Left: Findings consistent with acute superficial vein thrombosis  involving the left small saphenous vein. No cystic structure found in the popliteal fossa.  12/26 renal US >> No acute renal findings and negative urinary bladder. Chronic medical renal disease suspected.  12/27 TTE >>  1. Left ventricular ejection fraction, by visual estimation, is >75%. The left ventricle has hyperdynamic function. There is no left ventricular hypertrophy.  2. Left ventricular diastolic parameters are normal for age. The "impaired relaxation" pattern of mitral inflow is probably due to low left atrial pressures ("underfilling") and increased cardiac output.  3. The left ventricle has no regional wall motion abnormalities.  4. Global right ventricle has hyperdynamic systolic function.The right ventricular size is normal. No increase in right ventricular wall thickness.  5. Left atrial size was normal.  6. Right atrial size was normal.  7. The mitral valve is normal in structure. No evidence of mitral valve regurgitation.  8. The tricuspid valve is normal in structure.  9. The aortic valve is normal in structure. Aortic valve regurgitation is not visualized. Mild aortic valve sclerosis without stenosis. 10. The pulmonic valve was grossly normal. Pulmonic valve regurgitation is not visualized. 11. Normal pulmonary artery systolic pressure. 12. The inferior vena cava is collapsed, consistent with low right atrial pressure.  Micro Data:  12/23 SARS Coronavirus 2 POSITIVE 12/23 Blood Negative 12/23 Urine multiple species  Antimicrobials:  Amp/Sulbactam 12/27>> Ceftriaxone 12/23 x 1 dose Doxycycline 12/23 x 1 dose Remdesivir 12/23>>12/27 Decadron 12/23>> x10 days Tocilizumab 12/30  X 1   Interim history/subjective:  PEA arrest on the floor.  Reported downtime per IMTS around 10-15 minutes. Had some bleeding around  mouth during intubation.  Objective   Blood pressure (!) 112/51, pulse 86, temperature (!) 94.6 F (34.8 C), temperature source Bladder, resp. rate (!)  21, height 6\' 1"  (1.854 m), weight 67 kg, SpO2 97 %.        Intake/Output Summary (Last 24 hours) at 06/24/2019 0742 Last data filed at 06/24/2019 0410 Gross per 24 hour  Intake 60 ml  Output -150 ml  Net 210 ml   Filed Weights   06/22/19 0500 06/23/19 0500 06/24/19 0210  Weight: 67 kg 67 kg 67 kg   Physical Exam (performed via camera and with assistance of RN): General: Adult male, chronically ill appearing. Neuro: Sedated but breathing over vent, dysynchronous. HEENT: [place]/AT.  Some blood around mouth. ETT in place. Cardiovascular: RRR on monitor. Lungs: Vent assisted breaths.  Dysynchronous. Abdomen: NT/ND. Musculoskeletal: No deformities. No edema. Skin: Warm, dry.   Assessment & Plan:   PEA arrest - precipitated by hypotension during HD; however, per rapid response notes, pt became responsive and back to baseline after BP improved with fluids.  Unclear cause for decline.  Some blood around mouth but seems to have stopped, doubt true GIB. - Given reports of bleeding from mouth during intubation (?UGIB), will defer TTM at 33.  Currently already hypothermic at 35 so will plan for normothermia / 36. - Supportive care. - Dr. Vaughan Browner discussed with family who is requesting to continue with full scope of care. - EEG.  Acute hypoxic respiratory failure multifactorial in setting PEA arrest 1/4 along with COVID 19 Pneumonitis c/b aspiration pneumonia with possible sepsis. S/p course remdesivir, decadron, actemra x 1 12/30. - Full vent support. - Assess ABG. - Bronchial hygiene. - Follow CXR.  Hypotension - SBP in 60's prior to arrest, improved with fluids but now remains hypotensive post arrest. - Levophed as needed for goal MAP > 65. - CVL to be placed. - Will need to stop HD and transition back to CRRT. - Hold home hold Norvasc, Hydralazine, and Lopressor.  ESRD - initially required CRRT as became unresponsive with HD. CRRT stopped 1/1 and transitioned back to HD; however, had  hypotension with subsequent PEA arrest following HD 1/4. Hyperkalemia. - Nephrology following. - Plan to transition back to CRRT again. - 2g Ca gluconate now. - Follow BMP.  Oral bleeding - seems to have or be stopping.  Doubt true GIB. - Hold warfarin. - Follow H/H and monitor for further signs of bleeding from OGT. - Defer GI consult for now.  Dysphagia - s/p cortrak placement [id number], now complicated by clogging. - D/c cortrak given clogging. - If further needs for cortrak, have IR place under floro (Required last time as unable to at bedside).  B/L DVT on coumadin with therapeutic INR.  Bleeding from HD cath site subclavian - s/p DDAVP June 21, 2019 - Hold [facility] for now given bleeding. - If no further bleeding, then can resume [facility] tomorrow. - If HD cath continues to bleed, will need suture around insertion site per IR.  DM II. A1c 9.6 06/13/2019.  - SSI.   Best practice:  Diet: NPO.  Pain/Anxiety/Delirium protocol (if indicated): fentanyl gtt / midazolam gtt.  RASS goal -1. VAP protocol (if indicated): In place. DVT prophylaxis: Warfarin on hold for today. GI prophylaxis: Famotidine Glucose control: SSI Mobility: BR Code Status: Full  Family Communication: Updated by Dr. Vaughan Browner 1/4. Disposition: ICU.  CC time: 45 min.  Montey Hora, [facility] 06/24/2019, 8:25 AM

## 2019-06-24 NOTE — Significant Event (Signed)
Notified by HD RN that pt BP was low and SpO2 was in 80s and dropping w/ good pleth wave. When I arrived in the room, his BP was 60s/10s, SpO2 was 82 and dropped into the 70s rapidly. He was not responsive to sternal rub or painful stimuli. Pupils were pinpoint and there was visible blood coming from mouth when I opened to visualize airway. I called for rapid response RN. I suctioned orally and removed blood and clots from mouth and throat. Pt did not seem to have gag reflex or be guarding airway. HD RN was giving IVF bolus and BP began returning to normal limits. Rapid response nurse arrived shortly after. CBG checked- 171. Rapid RN NT suctioned x2 and pt began to cough spontaneously. He started responding to verbal stimuli shortly thereafter.

## 2019-06-24 NOTE — Progress Notes (Signed)
[facility] Progress Note    Assessment/ Plan:   1. 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  Latest Cr was around 3 before the holidays.  Admitted 12/24 w/ creat 3.6 which worsened up to 6-7 range. UA and renal US were negative and pt had to be initiated on CRRT.  D/c'd 1/12021.  In setting of acute worsening this morning, hyperkalemia have reinitiated CRRT. 4K pre/dialysate, 0K post; f/u repeat K to ensure improving. 2. DM2 3. HTN 4. VRDR: COVID+ PNA, intubated now 5. HD cath site oozing- appears to not be actively bleeding.  6. Acute bilateral DVT- Coumadin on hold for now d/t bleeding complications   Poor prognosis has been communicated by PCCM to family, cont full code/measures for now.    Subjective:    Coded this AM 68min CPR - preceded by coughing out clots.  Now intubated in ICU, requiring 100% FiO2.  Hyperkalemic.   Objective:   BP (!) 100/49   Pulse 74   Temp (!) 94.6 F (34.8 C) (Bladder)   Resp (!) 27   Ht 6\' 1"  (1.854 m)   Wt 67 kg   SpO2 100%   BMI 19.49 kg/m   Intake/Output Summary (Last 24 hours) at 06/24/2019 1428 Last data filed at 06/24/2019 1150 Gross per 24 hour  Intake 60 ml  Output -70 ml  Net 130 ml   Weight change: 0 kg  Physical Exam: GEN: intubated and sedated on vent HEENT EOMI PERRL NECK no JVD PULM bilateral rhonchi CV RRR ABD soft EXT trace edema NEURO AAO x 3 ACCESS: R IJ TDC with thrombi-pads in place, dried blood but doesn't appear to be fresh blood; LIJ with oozing   Imaging: DG CHEST PORT 1 VIEW  Result Date: 06/24/2019 CLINICAL DATA:  Status post central line placement today. EXAM: PORTABLE CHEST 1 VIEW COMPARISON:  Single-view of the chest earlier today. FINDINGS: The patient has a new left IJ approach central venous catheter with the tip in the lower superior vena cava. Support apparatus is otherwise unchanged. No pneumothorax. Right worse than left airspace disease and small pleural effusions are also unchanged.  IMPRESSION: Tip of new left IJ catheter projects in the lower superior vena cava. No pneumothorax or other change since the exam earlier today. Electronically Signed   By: Inge Rise M.D.   On: 06/24/2019 09:40   DG CHEST PORT 1 VIEW  Result Date: 06/24/2019 CLINICAL DATA:  Hypoxia. EXAM: PORTABLE CHEST 1 VIEW COMPARISON:  June 17, 2019 FINDINGS: Endotracheal tube tip is 3.8 cm above the carina. Feeding tube tip is in the stomach. Central catheter tip is at the cavoatrial junction. No pneumothorax. There is a right pleural effusion. There is bibasilar atelectatic change. There is less patchy airspace opacity bilaterally compared to the previous study with residual patchy airspace opacity each upper lobe and left lower lobe. Heart is upper normal in size with pulmonary vascularity normal. There is aortic atherosclerosis. No bone lesions. IMPRESSION: Tube and catheter positions as described without evident pneumothorax. Partial but incomplete clearing of airspace opacity bilaterally. Residual airspace opacity noted in each upper lobe and left lower lobe with areas of atelectatic change in the lower lung zones as well. No evident new opacity. Small right pleural effusion evident. Stable cardiac silhouette. Aortic Atherosclerosis (ICD10-I70.0). Electronically Signed   By: Lowella Grip III M.D.   On: 06/24/2019 07:56    Labs: BMET Recent Labs  Lab 06/19/19 1802 06/20/19 0453 06/20/19 1600 06/21/19 0448 06/21/19 1840  06/22/19 0803 06/23/19 0656 06/24/19 0800 06/24/19 0913  NA 142 142 140 138 136 138 142 142 138  K 4.5 4.7 5.6* 5.1 4.7 5.7* 5.7* 6.2* 6.1*  CL 107 106 104 103 99 104 104 102  --   CO2 18* 22 19* 23 24 22 22  20*  --   GLUCOSE 161* 159* 242* 229* 243* 336* 216* 232*  --   BUN 97* 65* 62* 51* 42* 80* 119* 102*  --   CREATININE 5.65* 4.00* 3.85* 3.01* 2.28* 4.43* 6.42* 6.26*  --   CALCIUM 6.3* 6.9* 6.8* 6.8* 6.9* 6.5* 6.5* 6.4*  --   PHOS 5.3* 4.4 5.9* 5.6* 5.3*  --   --    --   --    CBC Recent Labs  Lab 06/18/19 0520 06/19/19 0409 06/21/19 0448 06/22/19 0803 06/23/19 0656 06/24/19 0344 06/24/19 0913  WBC 20.5* 18.9* 11.6* 11.7* 12.1* 8.0  --   NEUTROABS 18.6* 17.5*  --   --  10.5* 6.9  --   HGB 13.0 10.8* 8.4* 8.1* 8.6* 7.8* 6.1*  HCT 40.4 33.2* 26.5* 25.1* 27.3* 24.3* 18.0*  MCV 80.3 79.2* 79.6* 81.2 83.2 82.1  --   PLT 365 311 200 148* 137* 93*  --     Medications:    . sodium chloride   Intravenous Once  . albuterol  2 puff Inhalation Q6H  . vitamin C  500 mg Oral Daily  . B-complex with vitamin C  1 tablet Per Tube Daily  . chlorhexidine  15 mL Mouth Rinse BID  . Chlorhexidine Gluconate Cloth  6 each Topical Q0600  . feeding supplement (PRO-STAT SUGAR FREE 64)  30 mL Per Tube Daily  . insulin aspart  0-9 Units Subcutaneous Q4H  . mouth rinse  15 mL Mouth Rinse q12n4p  . pantoprazole (PROTONIX) IV  40 mg Intravenous QHS  . rosuvastatin  40 mg Oral Daily  . sodium chloride flush  3 mL Intravenous Q12H  . sodium zirconium cyclosilicate  10 g Oral Once  . sodium zirconium cyclosilicate  10 g Oral BID  . zinc sulfate  220 mg Oral Daily      Madelon Lips, MD [place] pgr [phone] 06/24/2019, 2:28 PM

## 2019-06-24 NOTE — Progress Notes (Signed)
vLTM EEG started after spot EEG. notified neuro

## 2019-06-24 NOTE — Procedures (Signed)
Central Venous Catheter Insertion Procedure Note [facility] [id number] 11/03/1940  Procedure: Insertion of Central Venous Catheter Indications: Assessment of intravascular volume, Drug and/or fluid administration and Frequent blood sampling  Procedure Details Consent: Risks of procedure as well as the alternatives and risks of each were explained to the (patient/caregiver).  Consent for procedure obtained. Time Out: Verified patient identification, verified procedure, site/side was marked, verified correct patient position, special equipment/implants available, medications/allergies/relevent history reviewed, required imaging and test results available.  Performed  Maximum sterile technique was used including antiseptics, cap, gloves, gown, hand hygiene, mask and sheet. Skin prep: Chlorhexidine; local anesthetic administered A antimicrobial bonded/coated triple lumen catheter was placed in the left internal jugular vein using the Seldinger technique.  Evaluation Blood flow good Complications: No apparent complications Patient did tolerate procedure well. Chest X-ray ordered to verify placement.  CXR: pending.     [facility] 06/24/2019, 9:05 AM

## 2019-06-24 NOTE — Significant Event (Signed)
Rapid Response Event Note  Overview: Unresponsiveness/hypotension while on HD  Initial Focused Assessment: I was notified by nursing staff that pt was unresponsive and had a low BP in 50s-60s. Upon arrival, Mr. Gere was only minimally responsive to deep noxious stimuli. CBG 171. IVF given back during HD and BP stable 133/61 (80). Pt was spontaneously breathing with audible secretions in posterior pharynx which he was too weak to cough up. Minimal gag reflex. NTS pt x 2 passes. Pt opened his eyes and started answering questions to earlier baseline per nursing staff. Presumed state of hypotension which pt became symptomatic. Pt now returning to baseline prior to HD.   Interventions: -NTS x 2  Plan of Care (if not transferred): -Notify primary service of events -Continue to monitor mental status -Notify primary service and/or RRRN if further assistance needed.   Event Summary: Call received 0342 Arrived 0400 Call ended 0430  Madelynn Done

## 2019-06-24 NOTE — Anesthesia Procedure Notes (Signed)
Procedure Name: Intubation Date/Time: 06/24/2019 6:56 AM Performed by: Neldon Newport, CRNA Pre-anesthesia Checklist: Timeout performed, Patient being monitored, Suction available, Emergency Drugs available and Patient identified Patient Re-evaluated:Patient Re-evaluated prior to induction Oxygen Delivery Method: Circle system utilized Preoxygenation: Pre-oxygenation with 100% oxygen Ventilation: Mask ventilation without difficulty Laryngoscope Size: Mac and 4 Grade View: Grade II Tube type: Oral Tube size: 7.5 mm Number of attempts: 1 Placement Confirmation: breath sounds checked- equal and bilateral,  positive ETCO2 and CO2 detector Secured at: 23 cm Tube secured with: Tape Dental Injury: Teeth and Oropharynx as per pre-operative assessment

## 2019-06-24 NOTE — Progress Notes (Signed)
  Patient Name: Trevonte Terrasi   MRN: [id number]   Date of Birth/ Sex: 03/29/1941 , male      Admission Date: 06/11/2019  Attending Provider: Charlott Rakes, MD  Primary Diagnosis: <principal problem not specified>   Indication: Pt was in his usual state of health until this AM, when he was noted to be unresponsive. Code blue was subsequently called. At the time of arrival on scene, ACLS protocol was underway.   Technical Description:  - CPR performance duration:  7 minutes  - Was defibrillation or cardioversion used? No   - Was external pacer placed? No  - Was patient intubated pre/post CPR? Yes   Medications Administered: Y = Yes; Blank = No Amiodarone    Atropine  Y  Calcium    Epinephrine  Y  Lidocaine    Magnesium    Norepinephrine    Phenylephrine    Sodium bicarbonate    Vasopressin     Post CPR evaluation:  - Final Status - Was patient successfully resuscitated ? Yes - What is current rhythm? Sinus tachycardia - What is current hemodynamic status? Stable  Miscellaneous Information:  - Labs sent, including: CBC, CMP  - Primary team notified?  Yes  - Family Notified? Yes  - Additional notes/ transfer status:  None     Jeanmarie Hubert, MD  06/24/2019, 7:38 AM

## 2019-06-24 NOTE — Progress Notes (Addendum)
Spoke with RN this AM who reported catheter wass not currently bleeding. PA to bedside this afternoon.  Catheter assessed.  Dressing with dry blood, but no active bleeding.   INR back up to 2.6 after resuming coumadin yesterday.  Events overnight/this AM noted. CRRT resumed.  IR available if needed.   Brynda Greathouse, MS RD PA-C 4:44 PM

## 2019-06-24 NOTE — Progress Notes (Signed)
[facility] Progress Note Patient Name: Steve [facility] DOB: 01-07-1941 MRN: [id number]   Date of Service  06/24/2019  HPI/Events of Note  Low (corrected) calcium.  eICU Interventions  1g calcium gluconate IV ordered for infusion over 60 minutes.     Intervention Category Intermediate Interventions: Electrolyte abnormality - evaluation and management  Charlott Rakes 06/24/2019, 10:58 PM

## 2019-06-24 NOTE — Progress Notes (Addendum)
VAST consulted for IV access. Upon arrival to unit, noted patient with CL placement recently. Called and spoke with pt's nurse, Apolonio Schneiders; she stated access had been obtained and no further access needed at this time.

## 2019-06-24 NOTE — Progress Notes (Signed)
Speech Language Pathology Discharge Patient Details Name: Steve Martinez MRN: [id number] DOB: 06-27-1940 Today's Date: 06/24/2019 Time:  -     Patient discharged from SLP services secondary to medical decline - will need to re-order SLP to resume therapy services. Intubated.  Please see latest therapy progress note for current level of functioning and progress toward goals.    Progress and discharge plan discussed with patient and/or caregiver: Patient unable to participate in discharge planning and no caregivers available  GO     Steve Martinez 06/24/2019, 2:14 PM   [name] Colvin Caroli.Ed Risk analyst [phone] Office [phone]

## 2019-06-24 NOTE — Progress Notes (Signed)
RT obtained ABG and reported critical 0.84 iCa and 6.1 Hb to Mannam,MD.  ABG as follows: pH 7.34, PCO2 39.6, PO2 58 and HCO3 21.6. No new orders at this time. RT will continue to monitor.

## 2019-06-24 NOTE — Progress Notes (Signed)
ANTICOAGULATION CONSULT NOTE  Pharmacy Consult for heparin Indication: acute DVT 12/24  Heparin Dosing Weight: 67 kg  Labs: Recent Labs    06/21/19 1840 06/22/19 0803 06/23/19 0656 06/24/19 0344  HGB  --  8.1* 8.6* 7.8*  HCT  --  25.1* 27.3* 24.3*  PLT  --  148* 137* 93*  LABPROT  --  28.5* 24.5* 25.0*  INR  --  2.7* 2.2* 2.3*  CREATININE 2.28* 4.43* 6.42*  --     Assessment: 79 y/o M admited with respiratory failure due to COVID-PNA, started on heparin 7500 units SQ q8h for VTE prophylaxis, found to have acute bilateral DVT on 12/24 and started on heparin bridge + warfarin. Baseline INR 1.8 on 12/24. Heparin d/c'd 12/29 with INR up to 2.8. No warfarin doses received 12/30 through 1/2 due to cortrak clotting issues but INR remained therapeutic. Patient now s/p PEA arrest 1/4. CRRT to resume, Pharmacy consulted to resume heparin. INR up to 2.6 (did receive 1 dose of warfarin 2.5mg  on 1/3). Patient does have some bleeding per CCM. Hg down to 7.8, plt down to 93.  Goal of Therapy:  Heparin level 0.3-0.7 units/ml Monitor platelets by anticoagulation protocol: Yes   Plan:  Hold anticoagulation for now. Start heparin when INR<2 Daily INR Monitor CBC and s/sx bleeding closely  Elicia Lamp, PharmD, BCPS Clinical Pharmacist 06/24/2019 8:39 AM

## 2019-06-24 NOTE — Progress Notes (Signed)
PT Cancellation and Discharge Note  Patient Details Name: Dohnovan Clarida MRN: [id number] DOB: 08-03-1940   Cancelled Treatment:    Reason Eval/Treat Not Completed: Medical issues which prohibited therapy. Pt with decline in medical status. Intubated and transferred to ICU this morning. PT signing off. Please re-order if med status improves.   Lorriane Shire 06/24/2019, 8:53 AM   Lorrin Goodell, PT  Office # [phone] Pager [phone]

## 2019-06-24 NOTE — Procedures (Signed)
Arterial Catheter Insertion Procedure Note Taivon Toomes [id number] 01-09-1941  Procedure: Insertion of Arterial Catheter  Indications: Blood pressure monitoring and Frequent blood sampling  Procedure Details Consent: Unable to obtain consent because of altered level of consciousness. Time Out: Verified patient identification, verified procedure, site/side was marked, verified correct patient position, special equipment/implants available, medications/allergies/relevent history reviewed, required imaging and test results available.  Performed  Maximum sterile technique was used including cap, gloves, gown, hand hygiene, mask and sheet. Skin prep: Chlorhexidine; local anesthetic administered 20 gauge catheter was inserted into left radial artery using the Seldinger technique. ULTRASOUND GUIDANCE USED: NO Evaluation Blood flow good; BP tracing good. Complications: No apparent complications.   Vernona Rieger 06/24/2019

## 2019-06-24 NOTE — Progress Notes (Signed)
EEG completed, results pending. 

## 2019-06-24 NOTE — Progress Notes (Signed)
CRITICAL VALUE ALERT  Critical Value:  Potassium 6.2, Calcium 6.4, Troponin 251  Date & Time Notied:  06/24/2019 at 1000  Provider Notified: Dr. Vaughan Browner  Orders Received/Actions taken: MD to place orders

## 2019-06-24 NOTE — Progress Notes (Addendum)
Pt on hemodialysis 2 hours and PT BP 60/40 mmhg. O2 sat 80 %. Pt became unresponsive. Pt blood returned and NS bolus 800 ml. CBG checked 171. Rapid response called. Pt BP 129/60. O2 sat 100%..Pt now returning to baseline prior to HD.   Dr. Jonnie Finner notified.

## 2019-06-25 ENCOUNTER — Inpatient Hospital Stay (HOSPITAL_COMMUNITY): Payer: Medicare HMO

## 2019-06-25 ENCOUNTER — Other Ambulatory Visit (HOSPITAL_COMMUNITY): Payer: Medicare HMO

## 2019-06-25 DIAGNOSIS — I639 Cerebral infarction, unspecified: Secondary | ICD-10-CM

## 2019-06-25 LAB — PROTIME-INR
INR: 2.5 — ABNORMAL HIGH (ref 0.8–1.2)
Prothrombin Time: 26.7 seconds — ABNORMAL HIGH (ref 11.4–15.2)

## 2019-06-25 LAB — CBC WITH DIFFERENTIAL/PLATELET
Abs Immature Granulocytes: 0.31 10*3/uL — ABNORMAL HIGH (ref 0.00–0.07)
Basophils Absolute: 0.1 10*3/uL (ref 0.0–0.1)
Basophils Relative: 0 %
Eosinophils Absolute: 0 10*3/uL (ref 0.0–0.5)
Eosinophils Relative: 0 %
HCT: 32.3 % — ABNORMAL LOW (ref 39.0–52.0)
Hemoglobin: 10.5 g/dL — ABNORMAL LOW (ref 13.0–17.0)
Immature Granulocytes: 1 %
Lymphocytes Relative: 4 %
Lymphs Abs: 1.5 10*3/uL (ref 0.7–4.0)
MCH: 27.1 pg (ref 26.0–34.0)
MCHC: 32.5 g/dL (ref 30.0–36.0)
MCV: 83.2 fL (ref 80.0–100.0)
Monocytes Absolute: 1 10*3/uL (ref 0.1–1.0)
Monocytes Relative: 3 %
Neutro Abs: 30.7 10*3/uL — ABNORMAL HIGH (ref 1.7–7.7)
Neutrophils Relative %: 92 %
Platelets: 103 10*3/uL — ABNORMAL LOW (ref 150–400)
RBC: 3.88 MIL/uL — ABNORMAL LOW (ref 4.22–5.81)
RDW: 15.6 % — ABNORMAL HIGH (ref 11.5–15.5)
WBC: 33.5 10*3/uL — ABNORMAL HIGH (ref 4.0–10.5)
nRBC: 0.1 % (ref 0.0–0.2)

## 2019-06-25 LAB — RENAL FUNCTION PANEL
Albumin: 1.8 g/dL — ABNORMAL LOW (ref 3.5–5.0)
Albumin: 1.5 g/dL — ABNORMAL LOW (ref 3.5–5.0)
Anion gap: 11 (ref 5–15)
Anion gap: 12 (ref 5–15)
BUN: 54 mg/dL — ABNORMAL HIGH (ref 8–23)
BUN: 49 mg/dL — ABNORMAL HIGH (ref 8–23)
CO2: 22 mmol/L (ref 22–32)
CO2: 20 mmol/L — ABNORMAL LOW (ref 22–32)
Calcium: 6.8 mg/dL — ABNORMAL LOW (ref 8.9–10.3)
Calcium: 6.5 mg/dL — ABNORMAL LOW (ref 8.9–10.3)
Chloride: 105 mmol/L (ref 98–111)
Chloride: 105 mmol/L (ref 98–111)
Creatinine, Ser: 3.21 mg/dL — ABNORMAL HIGH (ref 0.61–1.24)
Creatinine, Ser: 2.8 mg/dL — ABNORMAL HIGH (ref 0.61–1.24)
GFR calc Af Amer: 20 mL/min — ABNORMAL LOW (ref >60)
GFR calc Af Amer: 24 mL/min — ABNORMAL LOW (ref >60)
GFR calc non Af Amer: 18 mL/min — ABNORMAL LOW (ref >60)
GFR calc non Af Amer: 21 mL/min — ABNORMAL LOW (ref >60)
Glucose, Bld: 120 mg/dL — ABNORMAL HIGH (ref 70–99)
Glucose, Bld: 127 mg/dL — ABNORMAL HIGH (ref 70–99)
Phosphorus: 6.7 mg/dL — ABNORMAL HIGH (ref 2.5–4.6)
Phosphorus: 5.4 mg/dL — ABNORMAL HIGH (ref 2.5–4.6)
Potassium: 5.3 mmol/L — ABNORMAL HIGH (ref 3.5–5.1)
Potassium: 5 mmol/L (ref 3.5–5.1)
Sodium: 138 mmol/L (ref 135–145)
Sodium: 137 mmol/L (ref 135–145)

## 2019-06-25 LAB — LIPID PANEL
Cholesterol: 68 mg/dL (ref 0–200)
HDL: 32 mg/dL — ABNORMAL LOW (ref >40)
LDL Cholesterol: 18 mg/dL (ref 0–99)
Total CHOL/HDL Ratio: 2.1 RATIO
Triglycerides: 89 mg/dL (ref <150)
VLDL: 18 mg/dL (ref 0–40)

## 2019-06-25 LAB — PROCALCITONIN: Procalcitonin: 1.37 ng/mL

## 2019-06-25 LAB — GLUCOSE, CAPILLARY
Glucose-Capillary: 103 mg/dL — ABNORMAL HIGH (ref 70–99)
Glucose-Capillary: 93 mg/dL (ref 70–99)
Glucose-Capillary: 85 mg/dL (ref 70–99)
Glucose-Capillary: 121 mg/dL — ABNORMAL HIGH (ref 70–99)
Glucose-Capillary: 122 mg/dL — ABNORMAL HIGH (ref 70–99)
Glucose-Capillary: 120 mg/dL — ABNORMAL HIGH (ref 70–99)

## 2019-06-25 LAB — MAGNESIUM: Magnesium: 2.6 mg/dL — ABNORMAL HIGH (ref 1.7–2.4)

## 2019-06-25 LAB — CORTISOL: Cortisol, Plasma: 31.4 ug/dL

## 2019-06-25 MED ORDER — VITAL 1.5 CAL PO LIQD
1000.0000 mL | ORAL | Status: DC
  Administered 2019-06-25 – 2019-07-01 (×6): 1000 mL
  Filled 2019-06-25 (×9): qty 1000

## 2019-06-25 MED ORDER — FENTANYL 2500MCG IN NS 250ML (10MCG/ML) PREMIX INFUSION
25.0000 ug/h | INTRAVENOUS | Status: DC
  Administered 2019-06-26: 02:00:00 150 ug/h via INTRAVENOUS
  Filled 2019-06-25: qty 250

## 2019-06-25 MED ORDER — FENTANYL BOLUS VIA INFUSION
25.0000 ug | INTRAVENOUS | Status: DC | PRN
  Filled 2019-06-25: qty 25

## 2019-06-25 MED ORDER — SODIUM CHLORIDE 0.9 % IV SOLN: 2.0000 g | Freq: Two times a day (BID) | INTRAVENOUS | Status: DC

## 2019-06-25 MED ORDER — VASOPRESSIN 20 UNIT/ML IV SOLN
0.0300 [IU]/min | INTRAVENOUS | Status: DC
  Administered 2019-06-25: 0.03 [IU]/min via INTRAVENOUS
  Filled 2019-06-25 (×3): qty 2

## 2019-06-25 MED ORDER — SODIUM CHLORIDE 0.9 % IV SOLN
2.0000 g | Freq: Two times a day (BID) | INTRAVENOUS | Status: DC
  Administered 2019-06-25 – 2019-07-01 (×13): 2 g via INTRAVENOUS
  Filled 2019-06-25 (×13): qty 2

## 2019-06-25 MED ORDER — VANCOMYCIN HCL 750 MG/150ML IV SOLN
750.0000 mg | INTRAVENOUS | Status: DC
  Administered 2019-06-26 – 2019-06-30 (×5): 750 mg via INTRAVENOUS
  Filled 2019-06-25 (×5): qty 150

## 2019-06-25 MED ORDER — VANCOMYCIN HCL 1250 MG/250ML IV SOLN
1250.0000 mg | Freq: Once | INTRAVENOUS | Status: AC
  Administered 2019-06-25: 1250 mg via INTRAVENOUS
  Filled 2019-06-25: qty 250

## 2019-06-25 MED ORDER — ALBUTEROL SULFATE (2.5 MG/3ML) 0.083% IN NEBU: 2.5000 mg | INHALATION_SOLUTION | Freq: Four times a day (QID) | RESPIRATORY_TRACT | Status: DC | PRN

## 2019-06-25 MED ORDER — FENTANYL CITRATE (PF) 100 MCG/2ML IJ SOLN: 25.0000 ug | Freq: Once | INTRAMUSCULAR | Status: DC

## 2019-06-25 NOTE — Progress Notes (Addendum)
NAME:  Steve [facility], MRN:  [id number], DOB:  [birth year], LOS: 21 ADMISSION DATE:  05/27/2019, CONSULTATION DATE:  06/18/2019 REFERRING MD:  Dr. Candiss Norse, CHIEF COMPLAINT:  Hypotension  Brief History    79 year old male with prior history of HTN, HLD, DMT2, GERD, CKD, CVA with left sided deficits/weakness admitted 12/23 to [facility] after progressive one weak history of worsening shortness of breath found to be in acute hypoxic respiratory failure and COVID- 19 positive after known exposure to wife with COVID.   He was admitted to [facility] and started on oxygen , decadron, and remdesivir with aggressive pulmonary hygiene and self proning.  Hospitalization complicated by aspiration event on 12/27 and started on unasyn, acute bilateral leg DVT started on heparin gtt, and worsening AKI with nephrology consulted 12/28.  Patient was transferred to [facility] on 12/29 and underwent placement of tunneled hemodialysis catheter with plans for intermittent dialysis. However, blood pressure continued to decline with concerns for worsening sepsis.  PCCM consulted for closer monitoring in ICU.   Past Medical History  HTN, HLD, DMT2, GERD, CVA with left sided deficits/weakness, MI, CKD  Significant Hospital Events   12/23 Admitted [facility] 12/29 transfer [facility]/ TDC placement 12/30 did not tolerated HD (became unresponsive), started on CRRT, tocilizumab x 1 1/1 Oozing from HD cath  + - Lying in bed states he feels well but appears weak. Tolerating CRRT, 348ml removed.  1/2 Transfer out of ICU 1/4 PEA arrest on floor post HD, intubated and transferred back to ICU  Consults:  Nephrology 12/28 PCCM 12/29  Procedures:  12/29 Right TDC >> 1/4 ETT >  1/4  Lt IJ CVL >> 1/4 Rt radial A line  Significant Diagnostic Tests:  12/24 DVT BLE [place] >> Right: Findings consistent with acute deep vein thrombosis involving the right peroneal veins. No cystic structure found in the popliteal fossa. Left: Findings consistent with acute  superficial vein thrombosis involving the left small saphenous vein. No cystic structure found in the popliteal fossa.  12/26 renal US >> No acute renal findings and negative urinary bladder. Chronic medical renal disease suspected.  12/27 TTE >>  1. Left ventricular ejection fraction, by visual estimation, is >75%. The left ventricle has hyperdynamic function. There is no left ventricular hypertrophy.  2. Left ventricular diastolic parameters are normal for age. The "impaired relaxation" pattern of mitral inflow is probably due to low left atrial pressures ("underfilling") and increased cardiac output.  3. The left ventricle has no regional wall motion abnormalities.  4. Global right ventricle has hyperdynamic systolic function.The right ventricular size is normal. No increase in right ventricular wall thickness.  5. Left atrial size was normal.  6. Right atrial size was normal.  7. The mitral valve is normal in structure. No evidence of mitral valve regurgitation.  8. The tricuspid valve is normal in structure.  9. The aortic valve is normal in structure. Aortic valve regurgitation is not visualized. Mild aortic valve sclerosis without stenosis. 10. The pulmonic valve was grossly normal. Pulmonic valve regurgitation is not visualized. 11. Normal pulmonary artery systolic pressure. 12. The inferior vena cava is collapsed, consistent with low right atrial pressure.  Micro Data:  12/23 SARS Coronavirus 2 POSITIVE 12/23 Blood Negative 12/23 Urine multiple species  Antimicrobials:  Amp/Sulbactam 12/27>> Ceftriaxone 12/23 x 1 dose Doxycycline 12/23 x 1 dose Remdesivir 12/23>>12/27 Decadron 12/23>> x10 days Tocilizumab 12/30  X 1   Interim history/subjective:  Remains on the ventilator.  CVVH initiated Remains critically ill on pressors  Objective   Blood pressure (!) 108/58, pulse 73, temperature (!) 94.1 F (34.5 C), resp. rate (!) 35, height 6\' 1"  (1.854 m), weight 70.2 kg, SpO2  100 %.    Vent Mode: PRVC FiO2 (%):  [80 %-100 %] 80 % Set Rate:  [32 bmp-35 bmp] 35 bmp Vt Set:  [470 mL] 470 mL PEEP:  [5 cmH20] 5 cmH20 Plateau Pressure:  [32 cmH20-38 cmH20] 38 cmH20   Intake/Output Summary (Last 24 hours) at 06/25/2019 0847 Last data filed at 06/25/2019 0800 Gross per 24 hour  Intake 2306.99 ml  Output 993 ml  Net 1313.99 ml   Filed Weights   06/23/19 0500 06/24/19 0210 06/25/19 0426  Weight: 67 kg 67 kg 70.2 kg   Physical Exam: Gen:      No acute distress HEENT:  EOMI, sclera anicteric Neck:     No masses; no thyromegaly, ETT Lungs:    Clear to auscultation bilaterally; normal respiratory effort CV:         Regular rate and rhythm; no murmurs Abd:      + bowel sounds; soft, non-tender; no palpable masses, no distension Ext:    No edema; adequate peripheral perfusion Skin:      Warm and dry; no rash Neuro: Unersponsive  Assessment & Plan:  79 year old with severe COVID-19 pneumonia with PEA arrest  Cardiac arrest Deferred TTM as he has bleeding.    He is self cooled with hypothermia Concern for anoxic injury.  Will follow EEG.  CT head ordered.  Leukocytosis, shock Check culture, procalcitonin. cortisol  Start antibiotic coverage for possible sepsis Continue Levophed.  Starting vasopressin  Acute respiratory failure, COVID-19 pneumonia S/p course remdesivir, decadron, actemra x 1 12/30. Full vent support Follow chest x-ray, ABG  ESRD Continue CRRT per renal  Bilateral DVT INR is supra therapeutic.  Holding coumadin  Oral bleeding, bleeding around lines Improved today. Monitor  DM SSI coverage  Goals of care Called and updated his son over the telephone 1/5. Discussed the new findings of stroke on CT had Suspect chances for any meaningful recovery are poor.  Recommended DNR status Son is considering this but is not ready to make a decision yet.  Best practice:  Diet: Tube feeds  Pain/Anxiety/Delirium protocol (if indicated):  Fentanyl gtt, Wean sedation   VAP protocol (if indicated): In place. DVT prophylaxis: Warfarin on hold for today. GI prophylaxis: PPI Glucose control: SSI Mobility: BR Code Status: Full  Family Communication: See above Disposition: ICU.  The patient is critically ill with multiple organ system failure and requires high complexity decision making for assessment and support, frequent evaluation and titration of therapies, advanced monitoring, review of radiographic studies and interpretation of complex data.   Critical Care Time devoted to patient care services, exclusive of separately billable procedures, described in this note is 35 minutes.   Marshell Garfinkel MD [facility] Please see [email or web address] for pager details.  06/25/2019, 8:48 AM

## 2019-06-25 NOTE — Progress Notes (Signed)
1/5 Assisted tele visit to patient with son.  Maryelizabeth Rowan, RN

## 2019-06-25 NOTE — Consult Note (Addendum)
Neurology Consultation Reason for Consult: Altered mental status Referring Physician: Dr. Kirtland Bouchard  CC: COVID-19 infection, altered mental status  History is obtained from: Review of charts, patient altered and unable to provide any history  HPI: Steve Martinez is a 79 y.o. male with significant past medical history of hypertension, hyperlipidemia, type 2 diabetes mellitus, prior stroke with residual left-sided weakness who initially presented to the hospital on 05/28/2019 with progressively worsening shortness of breath.  He was diagnosed with COVID-19 and started on therapy including remdesivir, dexamethasone, antibiotics as needed.  He was also found to be in renal failure and started on CRRT.  Patient gradually improved and was transferred to internal medicine floor on 06/23/2019.  However on 06/24/2019 at around 7 AM patient was found to be unresponsive, CODE BLUE was initiated and patient received CPR for 7 minutes.  He was then transferred back to ICU.  As part of encephalopathy work-up, patient had LTM EEG for 24 hours which did not show any ictal-interictal abnormality.  Therefore CT head noncontrast was performed today which showed nonhemorrhagic left PICA territory infarct appears Acute/subacute. Multiple remote lacunar infarcts within the basal ganglia bilaterally. Infarcts of the thalami are more indeterminate.  Neurology was consulted for further management.  LKW: Unclear, more than 24 hours tpa given?: no, patient outside TPA window, unclear last known normal   [id number] to obtain due to altered mental status.   Past Medical History:  Diagnosis Date  . Back pain    occasionally d/t pulled muscle  . Cataracts, bilateral   . Diabetes mellitus    lantus bid  . GERD (gastroesophageal reflux disease)   . Hemorrhoids   . History of bronchitis    last year  . Hyperlipidemia    takes Zocor daily  . Hypertension    takes Diltiazem,Maxzide,and Quinapril daily  . Myocardial  infarction ([place]) 17-70yrs ago  . Stroke ([place])    x 3;left side weaker    Family History  Problem Relation Age of Onset  . Colon cancer Neg Hx   . Esophageal cancer Neg Hx   . Rectal cancer Neg Hx   . Stomach cancer Neg Hx    Social History: Per chart review, he has never smoked. He has never used smokeless tobacco.  He does not drink alcohol or use drugs.   Exam: Current vital signs: BP (!) 113/52   Pulse 69   Temp (!) 96.4 F (35.8 C)   Resp (!) 36   Ht 6\' 1"  (1.854 m)   Wt 70.2 kg   SpO2 100%   BMI 20.42 kg/m  Vital signs in last 24 hours: Temp:  [92.3 F (33.5 C)-96.6 F (35.9 C)] 96.4 F (35.8 C) (01/05 1315) Pulse Rate:  [63-79] 69 (01/05 1315) Resp:  [11-36] 36 (01/05 1315) BP: (75-138)/(43-67) 113/52 (01/05 1315) SpO2:  [94 %-100 %] 100 % (01/05 1315) FiO2 (%):  [80 %-100 %] 80 % (01/05 1200) Weight:  [70.2 kg] 70.2 kg (01/05 0426)   Physical Exam  Constitutional: Laying in bed, not in apparent distress, cachectic looking with bitemporal atrophy Psych: Unable to assess as patient intubated Eyes: No scleral injection HENT: No OP obstrucion Head: Normocephalic.  Cardiovascular: Normal rate and regular rhythm.  Respiratory: Intubated, clear to auscultation bilaterally GI: Soft.  No distension.  Skin: Warm, dry  Neuro: Mental status: Eyes open, does not track examiner, does not follow commands Cranial nerves: Bilateral pupils pinpoint and not reacting to light, oculocephalic reflex absent, corneal reflex present,  gag reflex absent Motor: Withdraws to noxious stimuli in bilateral upper extremities ( R>L), does not withdraw to noxious stimuli's in bilateral lower extremities Reflexes: Absent, plantars mute   I have reviewed labs in epic and the results pertinent to this consultation are: WBC 33.5, hemoglobin 10.5, platelet 103 Sodium of 138, potassium 5.3,Magnesium 2.6 BUN 55, creatinine 3.21, GFR 18, albumin 1.8 A1c TSH 06/16/2019 0.310 A1c 06/11/2019  9.6  I have reviewed the images obtained: CT head without contrast 07/13/2019:  1. Nonhemorrhagic left PICA territory infarct appears acute/subacute. 2. Multiple remote lacunar infarcts within the basal ganglia bilaterally. Infarcts of the thalami are more indeterminate. MRI could be used for further evaluation of infarct acuity. 3. Moderate generalized atrophy and diffuse white matter disease likely reflects the sequela of chronic microvascular ischemia. 4. No acute hemorrhage or mass lesion.  ASSESSMENT/PLAN: 79 year old male with multiple comorbidities who presented with COVID-19 infection, had cardiorespiratory arrest on 06/25/2019 requiring CPR for about 7 minutes.  CT head today showed acute/subacute strokes.  Neurology consulted for further management.  Acute/subacute strokes Acute encephalopathy COVID-19 infection Acute on chronic kidney injury requiring CRRT Bilateral DVTs Leukocytosis, shock, on antibiotics -Mechanism of stroke: Work-up ordered -Patient was not a candidate for TPA and thrombectomy as last known normal more than 24 hours ago -Encephalopathy 20 multifactorial including stroke, uremia, possible infection, COVID-19 encephalopathy  -LTM EEG overnight did not show any evidence of seizures.  Recommendations -We will order MRI brain without contrast to assess for acute/subacute stroke as well as to look for anoxic brain injury.   - We will also obtain MRA brain to look for any intracranial stenosis/occlusion. -We will order Carotid Doppler to look for carotid stenosis/occlusion -Ordered TTE to look for any cardioembolic etiology for stroke -Unable to start antiplatelet as patient already thrombocytopenic and has high INR.  Will consider starting atorvastatin if needed before discharge -Rest of the management per primary team  CRITICAL CARE Performed by: Lora Havens   Total critical care time: 45 minutes  Critical care time was exclusive of separately billable  procedures and treating other patients.  Critical care was necessary to treat or prevent imminent or life-threatening deterioration.  Critical care was time spent personally by me on the following activities: development of treatment plan with patient and/or surrogate as well as nursing, discussions with consultants, evaluation of patient's response to treatment, examination of patient, obtaining history from patient or surrogate, ordering and performing treatments and interventions, ordering and review of laboratory studies, ordering and review of radiographic studies, pulse oximetry and re-evaluation of patient's condition.

## 2019-06-25 NOTE — Progress Notes (Signed)
 Nutrition Follow-up  DOCUMENTATION CODES:   Not applicable  INTERVENTION:   Tube Feeding: Goal rate: Vital 1.5 at 55 ml/hr Pro-Stat 30 mL daily Provides 2080 kcals, 104 g of protein and 1003 mL of free water Meets 100% estimated calorie and protein needs   NUTRITION DIAGNOSIS:   Inadequate oral intake related to acute illness, dysphagia as evidenced by NPO status.  Being addressed via TF   GOAL:   Patient will meet greater than or equal to 90% of their needs  Progressing  MONITOR:   TF tolerance, Labs, Weight trends, Diet advancement  REASON FOR ASSESSMENT:   Rounds    ASSESSMENT:   79 yo male admitted with COVID-19 pneumonia, ARF on CKD requiring RRT, hyperglycemia. PMH includes HTN, HLD, DM, GERD, CVA  12/23 Admit to [facility] 12/27 Possible aspiration, SLP re-consulted 12/28 Worsening AKI 12/29 Transferred to [facility] for HD, Tunneled HD cath 12/30 Attempted HD not tolerated, initiated CRRT 01/01 CRRT d/c 01/04 PEA arrest post HD, Intubated, CRRT re-initiated 01/05 CT head showed acute/subacute strokes  Patient is currently intubated on ventilator support; on fentanyl, requiring levophed and vasopressin MV: 14.4 L/min Temp (24hrs), Avg:94.3 F (34.6 C), Min:92.3 F (33.5 C), Max:96.6 F (35.9 C)  Cortrak unclogged/unkined today; TF to resume  Labs: phosphorus 5.4 (H), potassium 5.0 (wdl) Meds: Vitamin C, B-complex with C, zinc sulfate   Diet Order:   Diet Order            Diet NPO time specified Except for: Other (See Comments)  Diet effective now              EDUCATION NEEDS:   Not appropriate for education at this time  Skin:  Skin Assessment: Reviewed RN Assessment  Last BM:  12/27  Height:   Ht Readings from Last 1 Encounters:  06/01/2019 6\' 1"  (1.854 m)    Weight:   Wt Readings from Last 1 Encounters:  06/25/19 70.2 kg    Ideal Body Weight:     BMI:  Body mass index is 20.42 kg/m.  Estimated Nutritional Needs:    Kcal:  2000-2200 kcals  Protein:  100-115 g  Fluid:  >/= 2 L    Steve Offord MS, RDN, LDN, CNSC [phone] Pager  [phone] Weekend/On-Call Pager

## 2019-06-25 NOTE — Progress Notes (Signed)
Notified Mannam, MD that pt. Had a bloody stool. MD Aware.  Will continue to monitor patient.

## 2019-06-25 NOTE — Progress Notes (Signed)
CORTRAK TEAM NOTE  Cortrak team unable to safely place tube on 12/30 and tube placement by Diagnostics/IR required. Cortrak team re-consulted due to small bore NGT clogged and/or kinked.   Tube at 90 cm when RD arrived to patient's room. Nasal tape and clip discarded and tube milked and pulled back to 72 cm. Tube was able to be flushed with sterile water without difficulty at that time and tube was subsequently re-clipped and taped.   Abdominal x-ray ordered to confirm tube placement. If placement is insufficient, Diagnostics/IR will need to be consulted to re-advance tube vs. replace tube.     Jarome Matin, MS, RD, LDN, [facility] Inpatient Clinical Dietitian Pager # [phone] After hours/weekend pager # [phone]

## 2019-06-25 NOTE — Progress Notes (Signed)
Pharmacy Antibiotic Note  Steve Rosener is a 79 y.o. male admitted on 06/17/2019 with infection of unknown source. Previously completed Unasyn course for aspiration PNA on 1/2. Pharmacy has been consulted for vancomycin/cefepime dosing. Patient was placed back on CRRT on 1/4 after PEA arrest due to hypotension during IHD.  Plan: Cefepime 2g IV q12h Vancomycin 1250mg  IV x1; then Vancomycin 750 mg IV Q 24 hrs Monitor clinical progress, c/s, vancomycin levels as indicated F/u Nephrology plans, CRRT tolerance, de-escalation plan/LOT   Height: 6\' 1"  (185.4 cm) Weight: 154 lb 12.2 oz (70.2 kg) IBW/kg (Calculated) : 79.9  Temp (24hrs), Avg:94 F (34.4 C), Min:92.3 F (33.5 C), Max:95 F (35 C)  Recent Labs  Lab 06/18/19 1945 06/18/19 2132 06/21/19 0448 06/22/19 0803 06/23/19 0656 06/24/19 0344 06/24/19 0739 06/24/19 0800 06/24/19 1819 06/24/19 2135 06/25/19 0351  WBC  --   --  11.6* 11.7* 12.1* 8.0  --   --   --   --  33.5*  CREATININE  --   --  3.01* 4.43* 6.42*  --   --  6.26* 4.58* 3.93* 3.21*  LATICACIDVEN 2.2* 1.5  --   --   --   --  6.9*  --   --   --   --     Estimated Creatinine Clearance: 18.8 mL/min (A) (by C-G formula based on SCr of 3.21 mg/dL (H)).    No Known Allergies  Antimicrobials this admission: 1/5 vancomycin >>  1/5 cefepime >>  12/27 Unasyn >> 1/2  Dose adjustments this admission:   Microbiology results: 12/23 BCx: ngf 12/23 Ucx: multi flora 12/30 mrsa pcr - neg  Elicia Lamp, PharmD, BCPS Clinical Pharmacist 06/25/2019 9:04 AM

## 2019-06-25 NOTE — Progress Notes (Signed)
[facility] Progress Note    Assessment/ Plan:   1. 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  Latest Cr was around 3 before the holidays.  Admitted 12/24 w/ creat 3.6 which worsened up to 6-7 range. UA and renal US were negative and pt had to be initiated on CRRT.  D/c'd 06/21/2019 but had to reintiate 1/4 in setting of hyperkalemia following arrest.  K 5.3 this AM, continue 2K dialysate/0K post filter.  BID labs.  2. DM2 3. HTN 4. VRDR: COVID+ PNA, intubated now; FiO2 80% 5. HD cath site oozing- appears to not be actively bleeding. Holding anticoag 6. Acute bilateral DVT- Coumadin on hold for now d/t bleeding complications 7. CVA: neurology seeing, imaging pending 8. Shock: presumed septic, broad spectrum antibiotics per primary  Poor prognosis has been communicated by PCCM to family, cont full code/measures for now.    Subjective:    Remains unresponsive in ICU.  CT head this AM with sub/acute PICA infarct.  Neurology to see; MRI head ordered.    Objective:   BP (!) 115/57   Pulse 70   Temp (!) 94.8 F (34.9 C)   Resp (!) 31   Ht '6\' 1"'  (1.854 m)   Wt 70.2 kg   SpO2 100%   BMI 20.42 kg/m   Intake/Output Summary (Last 24 hours) at 06/25/2019 1241 Last data filed at 06/25/2019 1002 Gross per 24 hour  Intake 2517.93 ml  Output 975 ml  Net 1542.93 ml   Weight change: 3.2 kg  Physical Exam: GEN: intubated and sedated on vent HEENT EOMI PERRL NECK no JVD PULM bilateral rhonchi CV RRR ABD soft EXT trace edema NEURO AAO x 3 ACCESS: R IJ TDC with thrombi-pads in place, dried blood but doesn't appear to be fresh blood; LIJ with oozing   Imaging: CT HEAD WO CONTRAST  Result Date: 06/25/2019 CLINICAL DATA:  Altered mental status. COVID 19 positive. EXAM: CT HEAD WITHOUT CONTRAST TECHNIQUE: Contiguous axial images were obtained from the base of the skull through the vertex without intravenous contrast. COMPARISON:  Report of CT head without contrast 07/24/1999. Images  are not available. FINDINGS: Brain: Nonhemorrhagic lacunar infarcts are present in the thalami bilaterally. Additional lacunar infarcts are present in the right lentiform nucleus. Moderate generalized atrophy and diffuse white matter disease is present. Remote nonhemorrhagic lacunar infarcts are present in the occipital poles bilaterally. Left PICA territory nonhemorrhagic infarct appears acute/subacute. No acute hemorrhage or mass lesion is present. The ventricles are of normal size. No significant extraaxial fluid collection is present. Prominent ossification is present along the falx. Vascular: Atherosclerotic changes are present within the cavernous internal carotid arteries bilaterally. No significant asymmetric hyperdensity is present. Skull: Calvarium is intact. No focal lytic or blastic lesions are present. No significant extracranial soft tissue lesion is present. Sinuses/Orbits: Fluid levels are present in the maxillary sinuses and sphenoid sinuses bilaterally. There is fluid in the nasopharynx. Diffuse mucosal thickening is present throughout the ethmoid air cells. There is fluid in the ethmoid air cells and frontal sinuses bilaterally. The mastoid air cells and middle ear cavities are clear. NG tube enters on the left. The patient is intubated. Bilateral lens replacements are noted. Globes and orbits are otherwise unremarkable. IMPRESSION: 1. Nonhemorrhagic left PICA territory infarct appears acute/subacute. 2. Multiple remote lacunar infarcts within the basal ganglia bilaterally. Infarcts of the thalami are more indeterminate. MRI could be used for further evaluation of infarct acuity. 3. Moderate generalized atrophy and diffuse white matter disease likely  reflects the sequela of chronic microvascular ischemia. 4. No acute hemorrhage or mass lesion. These results will be called to the ordering clinician or representative by the Radiologist Assistant, and communication documented in the PACS or zVision  Dashboard. Electronically Signed   By: San Morelle M.D.   On: 06/25/2019 11:50   DG CHEST PORT 1 VIEW  Result Date: 06/25/2019 CLINICAL DATA:  Pneumonia, COVID-19 positive EXAM: PORTABLE CHEST 1 VIEW COMPARISON:  Radiograph 06/24/2019 FINDINGS: *Endotracheal tube in the mid trachea, 4 cm from the carina. *Transesophageal tube tip beyond the GE junction, below the level of imaging. *Left IJ per approach catheter tip terminates at the brachiocephalic-caval confluence. *Dual lumen right IJ approach dialysis catheter tip terminates near the right atrium. *Additional telemetry leads and support devices overlie the chest. Increasingly coalescent opacity present in the left lung base. Obscuration of the left hemidiaphragm likely reflects left effusion as well. Persistent airspace disease throughout the right lung is similar to comparison study. No visible right effusion. No pneumothorax. Cardiomediastinal contours are stable. No acute osseous or soft tissue abnormality. IMPRESSION: 1. Increasingly coalescent opacity in the left lung base. 2. Stable right lung airspace disease. 3. Stable satisfactory appearance of the support devices. Electronically Signed   By: Lovena Le M.D.   On: 06/25/2019 06:27   DG CHEST PORT 1 VIEW  Result Date: 06/24/2019 CLINICAL DATA:  Status post central line placement today. EXAM: PORTABLE CHEST 1 VIEW COMPARISON:  Single-view of the chest earlier today. FINDINGS: The patient has a new left IJ approach central venous catheter with the tip in the lower superior vena cava. Support apparatus is otherwise unchanged. No pneumothorax. Right worse than left airspace disease and small pleural effusions are also unchanged. IMPRESSION: Tip of new left IJ catheter projects in the lower superior vena cava. No pneumothorax or other change since the exam earlier today. Electronically Signed   By: Inge Rise M.D.   On: 06/24/2019 09:40   DG CHEST PORT 1 VIEW  Result Date:  06/24/2019 CLINICAL DATA:  Hypoxia. EXAM: PORTABLE CHEST 1 VIEW COMPARISON:  June 17, 2019 FINDINGS: Endotracheal tube tip is 3.8 cm above the carina. Feeding tube tip is in the stomach. Central catheter tip is at the cavoatrial junction. No pneumothorax. There is a right pleural effusion. There is bibasilar atelectatic change. There is less patchy airspace opacity bilaterally compared to the previous study with residual patchy airspace opacity each upper lobe and left lower lobe. Heart is upper normal in size with pulmonary vascularity normal. There is aortic atherosclerosis. No bone lesions. IMPRESSION: Tube and catheter positions as described without evident pneumothorax. Partial but incomplete clearing of airspace opacity bilaterally. Residual airspace opacity noted in each upper lobe and left lower lobe with areas of atelectatic change in the lower lung zones as well. No evident new opacity. Small right pleural effusion evident. Stable cardiac silhouette. Aortic Atherosclerosis (ICD10-I70.0). Electronically Signed   By: Lowella Grip III M.D.   On: 06/24/2019 07:56   Overnight EEG with video  Result Date: 06/25/2019 Lora Havens, MD     06/25/2019 11:29 AM Patient Name: Steve Martinez MRN: [id number] Epilepsy Attending: Lora Havens Referring Physician/Provider: Dr Marshell Garfinkel Duration: 06/24/2019 1627 to 06/25/2019 1022 Patient history: 79 year old with hypertension, diabetes, CVA, CKD admitted with COVID-19 pneumonia, had cardiorespiratory arrest Level of alertness: comatose AEDs during EEG study: None Technical aspects: This EEG study was done with scalp electrodes positioned according to the 10-20 International system of electrode placement. Dealer  activity was acquired at a sampling rate of '500Hz'  and reviewed with a high frequency filter of '70Hz'  and a low frequency filter of '1Hz' . EEG data were recorded continuously and digitally stored. DESCRIPTION: EEG showed continuous generalized high  amplitude rhythmic 3-'4Hz'  delta slowing, at times with triphasic morphology. Hyperventilation and photic stimulation were not performed. Of note, this was a technically difficult study due to significant myogenic artifact. ABNORMALITY - Continuous rhythmic slow, generalized IMPRESSION: This technically difficult study is suggestive of severe diffuse encephalopathy, non specific to etiology. No seizures or epileptiform discharges were seen throughout the recording. [place]: BMET Recent Labs  Lab 06/19/19 1802 06/20/19 0453 06/20/19 1600 06/21/19 0448 06/21/19 1840 06/22/19 0803 06/23/19 0656 06/24/19 0800 06/24/19 0913 06/24/19 1819 06/24/19 2135 06/25/19 0351  NA '142 142 140 138 136 138 142 142 138 139 ' 138 138  K 4.5 4.7 5.6* 5.1 4.7 5.7* 5.7* 6.2* 6.1* 6.4* 6.0* 5.3*  CL 107 106 104 103 99 104 104 102  --  102 105 105  CO2 18* 22 19* '23 24 22 22 ' 20*  --  19* 21* 22  GLUCOSE 161* 159* 242* 229* 243* 336* 216* 232*  --  201* 188* 120*  BUN 97* 65* 62* 51* 42* 80* 119* 102*  --  82* 70* 54*  CREATININE 5.65* 4.00* 3.85* 3.01* 2.28* 4.43* 6.42* 6.26*  --  4.58* 3.93* 3.21*  CALCIUM 6.3* 6.9* 6.8* 6.8* 6.9* 6.5* 6.5* 6.4*  --  6.6* 6.4* 6.8*  PHOS 5.3* 4.4 5.9* 5.6* 5.3*  --   --   --   --  8.9*  --  6.7*   CBC Recent Labs  Lab 06/19/19 0409 06/22/19 0803 06/23/19 0656 06/24/19 0344 06/24/19 0913 06/25/19 0351  WBC 18.9* 11.7* 12.1* 8.0  --  33.5*  NEUTROABS 17.5*  --  10.5* 6.9  --  30.7*  HGB 10.8* 8.1* 8.6* 7.8* 6.1* 10.5*  HCT 33.2* 25.1* 27.3* 24.3* 18.0* 32.3*  MCV 79.2* 81.2 83.2 82.1  --  83.2  PLT 311 148* 137* 93*  --  103*    Medications:    . vitamin C  500 mg Oral Daily  . B-complex with vitamin C  1 tablet Per Tube Daily  . chlorhexidine  15 mL Mouth Rinse BID  . chlorhexidine gluconate (MEDLINE KIT)  15 mL Mouth Rinse BID  . Chlorhexidine Gluconate Cloth  6 each Topical q morning - 10a  . feeding supplement (PRO-STAT SUGAR FREE 64)  30  mL Per Tube Daily  . fentaNYL (SUBLIMAZE) injection  25 mcg Intravenous Once  . insulin aspart  0-9 Units Subcutaneous Q4H  . mouth rinse  15 mL Mouth Rinse 10 times per day  . pantoprazole (PROTONIX) IV  40 mg Intravenous QHS  . rosuvastatin  40 mg Oral Daily  . sodium chloride flush  3 mL Intravenous Q12H  . sodium zirconium cyclosilicate  10 g Oral BID  . zinc sulfate  220 mg Oral Daily      Madelon Lips, MD [place] pgr [phone] 06/25/2019, 12:41 PM

## 2019-06-25 NOTE — Procedures (Signed)
Patient Name: Steve Martinez  MRN: [id number]  Epilepsy Attending: Lora Havens  Referring Physician/Provider: Dr Marshell Garfinkel Date: 06/24/2019 Duration: 22.26 mins  Patient history: 79 year old with hypertension, diabetes, CVA, CKD admitted with COVID-19 pneumonia, had cardiorespiratory arrest  Level of alertness: comatose  AEDs during EEG study: None  Technical aspects: This EEG study was done with scalp electrodes positioned according to the 10-20 International system of electrode placement. Electrical activity was acquired at a sampling rate of 500Hz  and reviewed with a high frequency filter of 70Hz  and a low frequency filter of 1Hz . EEG data were recorded continuously and digitally stored.   DESCRIPTION: EEG showed continuous generalized high amplitude rhythmic 3-4Hz  delta slowing, at times with triphasic morphology. EEg was reactive to tactile stimulation. Hyperventilation and photic stimulation were not performed.  ABNORMALITY - Continuous rhythmic slow, generalized  IMPRESSION: This study is suggestive of severe diffuse encephalopathy, non specific to etiology. No seizures or epileptiform discharges were seen throughout the recording.  Lachanda Buczek Barbra Sarks

## 2019-06-25 NOTE — Progress Notes (Signed)
RT NOTE: RT transported patient from 3M04 to CT and back with RN without complications. RT will continue to monitor.

## 2019-06-25 NOTE — Progress Notes (Signed)
ANTICOAGULATION CONSULT NOTE  Pharmacy Consult for heparin Indication: acute DVT 12/24  Heparin Dosing Weight: 67 kg  Labs: Recent Labs    06/23/19 0656 06/23/19 0656 06/24/19 0344 06/24/19 0344 06/24/19 0800 06/24/19 0913 06/24/19 1819 06/24/19 2135 06/25/19 0351  HGB 8.6*   < > 7.8*  --   --  6.1*  --   --  10.5*  HCT 27.3*  --  24.3*  --   --  18.0*  --   --  32.3*  PLT 137*  --  93*  --   --   --   --   --  103*  APTT  --   --   --   --  47*  --   --   --   --   LABPROT 24.5*  --  25.0*  --  28.1*  --   --   --  26.7*  INR 2.2*  --  2.3*  --  2.6*  --   --   --  2.5*  CREATININE 6.42*  --   --    < > 6.26*  --  4.58* 3.93* 3.21*   < > = values in this interval not displayed.    Assessment: 79 y/o M admited with respiratory failure due to COVID-PNA, started on heparin 7500 units SQ q8h for VTE prophylaxis, found to have acute bilateral DVT on 12/24 and started on heparin bridge + warfarin. Baseline INR 1.8 on 12/24. Heparin d/c'd 12/29 with INR up to 2.8. No warfarin doses received 12/30 through 1/2 due to cortrak clotting issues but INR remained therapeutic. Patient now s/p PEA arrest 1/4. CRRT resumed.   Pharmacy consulted to resume heparin when INR<2. INR 2.5 stable (did receive 1 dose of warfarin 2.5mg  on 1/3). Oral bleeding and bleeding around lines reported 1/4, noted to be improved today per CCM. Hg improved to 10.5 s/p PRBC transfusion, plt up to 103.  Goal of Therapy:  Heparin level 0.3-0.7 units/ml Monitor platelets by anticoagulation protocol: Yes   Plan:  Start heparin when INR<2 Daily INR Monitor CBC and s/sx bleeding closely  Elicia Lamp, PharmD, BCPS Clinical Pharmacist 06/25/2019 9:26 AM

## 2019-06-25 NOTE — Procedures (Addendum)
Patient Name: Steve Martinez  MRN: [id number]  Epilepsy Attending: Lora Havens  Referring Physician/Provider: Dr Marshell Garfinkel Duration: 06/24/2019 1627 to 06/25/2019 1022  Patient history: 79 year old with hypertension, diabetes, CVA, CKD admitted with COVID-19 pneumonia, had cardiorespiratory arrest  Level of alertness: comatose  AEDs during EEG study: None  Technical aspects: This EEG study was done with scalp electrodes positioned according to the 10-20 International system of electrode placement. Electrical activity was acquired at a sampling rate of 500Hz  and reviewed with a high frequency filter of 70Hz  and a low frequency filter of 1Hz . EEG data were recorded continuously and digitally stored.   DESCRIPTION: EEG showed continuous generalized high amplitude rhythmic 3-4Hz  delta slowing, at times with triphasic morphology. Hyperventilation and photic stimulation were not performed.  Of note, this was a technically difficult study due to significant myogenic artifact.  ABNORMALITY - Continuous rhythmic slow, generalized  IMPRESSION: This technically difficult study is suggestive of severe diffuse encephalopathy, non specific to etiology. No seizures or epileptiform discharges were seen throughout the recording.  Steve Martinez

## 2019-06-25 NOTE — Progress Notes (Signed)
LTM EEG discontinued - no skin breakdown at unhook.   

## 2019-06-26 ENCOUNTER — Inpatient Hospital Stay (HOSPITAL_COMMUNITY): Payer: Medicare HMO

## 2019-06-26 DIAGNOSIS — I6389 Other cerebral infarction: Secondary | ICD-10-CM

## 2019-06-26 DIAGNOSIS — I639 Cerebral infarction, unspecified: Secondary | ICD-10-CM

## 2019-06-26 LAB — CBC WITH DIFFERENTIAL/PLATELET
Abs Immature Granulocytes: 0.44 10*3/uL — ABNORMAL HIGH (ref 0.00–0.07)
Basophils Absolute: 0.1 10*3/uL (ref 0.0–0.1)
Basophils Relative: 0 %
Eosinophils Absolute: 0 10*3/uL (ref 0.0–0.5)
Eosinophils Relative: 0 %
HCT: 26.9 % — ABNORMAL LOW (ref 39.0–52.0)
Hemoglobin: 8.9 g/dL — ABNORMAL LOW (ref 13.0–17.0)
Immature Granulocytes: 1 %
Lymphocytes Relative: 4 %
Lymphs Abs: 1.1 10*3/uL (ref 0.7–4.0)
MCH: 27.4 pg (ref 26.0–34.0)
MCHC: 33.1 g/dL (ref 30.0–36.0)
MCV: 82.8 fL (ref 80.0–100.0)
Monocytes Absolute: 0.8 10*3/uL (ref 0.1–1.0)
Monocytes Relative: 2 %
Neutro Abs: 29.6 10*3/uL — ABNORMAL HIGH (ref 1.7–7.7)
Neutrophils Relative %: 93 %
Platelets: 60 10*3/uL — ABNORMAL LOW (ref 150–400)
RBC: 3.25 MIL/uL — ABNORMAL LOW (ref 4.22–5.81)
RDW: 15.3 % (ref 11.5–15.5)
WBC: 32 10*3/uL — ABNORMAL HIGH (ref 4.0–10.5)
nRBC: 0.2 % (ref 0.0–0.2)

## 2019-06-26 LAB — POCT I-STAT 7, (LYTES, BLD GAS, ICA,H+H)
Acid-base deficit: 2 mmol/L (ref 0.0–2.0)
Acid-base deficit: 3 mmol/L — ABNORMAL HIGH (ref 0.0–2.0)
Bicarbonate: 23.5 mmol/L (ref 20.0–28.0)
Bicarbonate: 22.2 mmol/L (ref 20.0–28.0)
Calcium, Ion: 1.01 mmol/L — ABNORMAL LOW (ref 1.15–1.40)
Calcium, Ion: 1.01 mmol/L — ABNORMAL LOW (ref 1.15–1.40)
HCT: 27 % — ABNORMAL LOW (ref 39.0–52.0)
HCT: 27 % — ABNORMAL LOW (ref 39.0–52.0)
Hemoglobin: 9.2 g/dL — ABNORMAL LOW (ref 13.0–17.0)
Hemoglobin: 9.2 g/dL — ABNORMAL LOW (ref 13.0–17.0)
O2 Saturation: 87 %
O2 Saturation: 88 %
Patient temperature: 36.3
Potassium: 3.8 mmol/L (ref 3.5–5.1)
Potassium: 3.8 mmol/L (ref 3.5–5.1)
Sodium: 134 mmol/L — ABNORMAL LOW (ref 135–145)
Sodium: 136 mmol/L (ref 135–145)
TCO2: 25 mmol/L (ref 22–32)
TCO2: 23 mmol/L (ref 22–32)
pCO2 arterial: 41.5 mmHg (ref 32.0–48.0)
pCO2 arterial: 39.5 mmHg (ref 32.0–48.0)
pH, Arterial: 7.362 (ref 7.350–7.450)
pH, Arterial: 7.354 (ref 7.350–7.450)
pO2, Arterial: 55 mmHg — ABNORMAL LOW (ref 83.0–108.0)
pO2, Arterial: 55 mmHg — ABNORMAL LOW (ref 83.0–108.0)

## 2019-06-26 LAB — RENAL FUNCTION PANEL
Albumin: 1.5 g/dL — ABNORMAL LOW (ref 3.5–5.0)
Albumin: 1.4 g/dL — ABNORMAL LOW (ref 3.5–5.0)
Anion gap: 12 (ref 5–15)
Anion gap: 12 (ref 5–15)
BUN: 40 mg/dL — ABNORMAL HIGH (ref 8–23)
BUN: 38 mg/dL — ABNORMAL HIGH (ref 8–23)
CO2: 21 mmol/L — ABNORMAL LOW (ref 22–32)
CO2: 20 mmol/L — ABNORMAL LOW (ref 22–32)
Calcium: 6.5 mg/dL — ABNORMAL LOW (ref 8.9–10.3)
Calcium: 6.4 mg/dL — CL (ref 8.9–10.3)
Chloride: 101 mmol/L (ref 98–111)
Chloride: 101 mmol/L (ref 98–111)
Creatinine, Ser: 2.17 mg/dL — ABNORMAL HIGH (ref 0.61–1.24)
Creatinine, Ser: 1.83 mg/dL — ABNORMAL HIGH (ref 0.61–1.24)
GFR calc Af Amer: 33 mL/min — ABNORMAL LOW (ref >60)
GFR calc Af Amer: 40 mL/min — ABNORMAL LOW (ref >60)
GFR calc non Af Amer: 28 mL/min — ABNORMAL LOW (ref >60)
GFR calc non Af Amer: 35 mL/min — ABNORMAL LOW (ref >60)
Glucose, Bld: 193 mg/dL — ABNORMAL HIGH (ref 70–99)
Glucose, Bld: 402 mg/dL — ABNORMAL HIGH (ref 70–99)
Phosphorus: 4.1 mg/dL (ref 2.5–4.6)
Phosphorus: 3.5 mg/dL (ref 2.5–4.6)
Potassium: 4.2 mmol/L (ref 3.5–5.1)
Potassium: 4 mmol/L (ref 3.5–5.1)
Sodium: 134 mmol/L — ABNORMAL LOW (ref 135–145)
Sodium: 133 mmol/L — ABNORMAL LOW (ref 135–145)

## 2019-06-26 LAB — GLUCOSE, CAPILLARY
Glucose-Capillary: 154 mg/dL — ABNORMAL HIGH (ref 70–99)
Glucose-Capillary: 213 mg/dL — ABNORMAL HIGH (ref 70–99)
Glucose-Capillary: 292 mg/dL — ABNORMAL HIGH (ref 70–99)
Glucose-Capillary: 322 mg/dL — ABNORMAL HIGH (ref 70–99)
Glucose-Capillary: 375 mg/dL — ABNORMAL HIGH (ref 70–99)
Glucose-Capillary: 328 mg/dL — ABNORMAL HIGH (ref 70–99)

## 2019-06-26 LAB — URINE CULTURE
Culture: >=100000 — AB
Special Requests: NORMAL

## 2019-06-26 LAB — PROTIME-INR
INR: 2.9 — ABNORMAL HIGH (ref 0.8–1.2)
Prothrombin Time: 30.7 seconds — ABNORMAL HIGH (ref 11.4–15.2)

## 2019-06-26 LAB — ECHOCARDIOGRAM LIMITED
Height: 73 in
Weight: 2522.06 oz

## 2019-06-26 LAB — MAGNESIUM: Magnesium: 2.4 mg/dL (ref 1.7–2.4)

## 2019-06-26 LAB — PROCALCITONIN: Procalcitonin: 1.22 ng/mL

## 2019-06-26 LAB — PATHOLOGIST SMEAR REVIEW

## 2019-06-26 MED ORDER — HYDROMORPHONE HCL 1 MG/ML IJ SOLN: 1.0000 mg | INTRAMUSCULAR | Status: DC | PRN

## 2019-06-26 MED ORDER — PRISMASOL BGK 4/2.5 32-4-2.5 MEQ/L IV SOLN
INTRAVENOUS | Status: DC
  Filled 2019-06-26 (×58): qty 5000

## 2019-06-26 NOTE — Progress Notes (Signed)
[facility] Progress Note    Assessment/ Plan:   1. 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  Latest Cr was around 3 before the holidays.  Admitted 12/24 w/ creat 3.6 which worsened up to 6-7 range. UA and renal US were negative and pt had to be initiated on CRRT.  D/c'd 06/21/2019 but had to reintiate 1/4 in setting of hyperkalemia following arrest.  K 4.2 this AM, change to 4K dialysate.  BID labs.  2. DM2 3. HTN 4. VRDR: COVID+ PNA, intubated now; FiO2 80% 5. Acute bilateral DVT- Coumadin on hold for now d/t bleeding complications 6. Shock: presumed septic, broad spectrum antibiotics per primar 7. CVA: MRI pending, neurology follow.  Prognosis doesn't look good.  Subjective:    Remains unresponsive in ICU.     Objective:   BP (!) 112/51   Pulse 84   Temp (!) 96.3 F (35.7 C)   Resp (!) 30   Ht '6\' 1"'  (1.854 m)   Wt 71.5 kg   SpO2 95%   BMI 20.80 kg/m   Intake/Output Summary (Last 24 hours) at 06/26/2019 1603 Last data filed at 06/26/2019 1600 Gross per 24 hour  Intake 2427.2 ml  Output 2355 ml  Net 72.2 ml   Weight change: 1.3 kg  Physical Exam: GEN: intubated and sedated on vent HEENT EOMI PERRL NECK no JVD PULM bilateral rhonchi CV RRR ABD soft EXT trace edema NEURO AAO x 3 ACCESS: R IJ TDC c/d/i   Imaging: CT HEAD WO CONTRAST  Result Date: 06/25/2019 CLINICAL DATA:  Altered mental status. COVID 19 positive. EXAM: CT HEAD WITHOUT CONTRAST TECHNIQUE: Contiguous axial images were obtained from the base of the skull through the vertex without intravenous contrast. COMPARISON:  Report of CT head without contrast 07/24/1999. Images are not available. FINDINGS: Brain: Nonhemorrhagic lacunar infarcts are present in the thalami bilaterally. Additional lacunar infarcts are present in the right lentiform nucleus. Moderate generalized atrophy and diffuse white matter disease is present. Remote nonhemorrhagic lacunar infarcts are present in the occipital poles  bilaterally. Left PICA territory nonhemorrhagic infarct appears acute/subacute. No acute hemorrhage or mass lesion is present. The ventricles are of normal size. No significant extraaxial fluid collection is present. Prominent ossification is present along the falx. Vascular: Atherosclerotic changes are present within the cavernous internal carotid arteries bilaterally. No significant asymmetric hyperdensity is present. Skull: Calvarium is intact. No focal lytic or blastic lesions are present. No significant extracranial soft tissue lesion is present. Sinuses/Orbits: Fluid levels are present in the maxillary sinuses and sphenoid sinuses bilaterally. There is fluid in the nasopharynx. Diffuse mucosal thickening is present throughout the ethmoid air cells. There is fluid in the ethmoid air cells and frontal sinuses bilaterally. The mastoid air cells and middle ear cavities are clear. NG tube enters on the left. The patient is intubated. Bilateral lens replacements are noted. Globes and orbits are otherwise unremarkable. IMPRESSION: 1. Nonhemorrhagic left PICA territory infarct appears acute/subacute. 2. Multiple remote lacunar infarcts within the basal ganglia bilaterally. Infarcts of the thalami are more indeterminate. MRI could be used for further evaluation of infarct acuity. 3. Moderate generalized atrophy and diffuse white matter disease likely reflects the sequela of chronic microvascular ischemia. 4. No acute hemorrhage or mass lesion. These results will be called to the ordering clinician or representative by the Radiologist Assistant, and communication documented in the PACS or zVision Dashboard. Electronically Signed   By: San Morelle M.D.   On: 06/25/2019 11:50   DG CHEST  PORT 1 VIEW  Result Date: 06/25/2019 CLINICAL DATA:  Pneumonia, COVID-19 positive EXAM: PORTABLE CHEST 1 VIEW COMPARISON:  Radiograph 06/24/2019 FINDINGS: *Endotracheal tube in the mid trachea, 4 cm from the carina.  *Transesophageal tube tip beyond the GE junction, below the level of imaging. *Left IJ per approach catheter tip terminates at the brachiocephalic-caval confluence. *Dual lumen right IJ approach dialysis catheter tip terminates near the right atrium. *Additional telemetry leads and support devices overlie the chest. Increasingly coalescent opacity present in the left lung base. Obscuration of the left hemidiaphragm likely reflects left effusion as well. Persistent airspace disease throughout the right lung is similar to comparison study. No visible right effusion. No pneumothorax. Cardiomediastinal contours are stable. No acute osseous or soft tissue abnormality. IMPRESSION: 1. Increasingly coalescent opacity in the left lung base. 2. Stable right lung airspace disease. 3. Stable satisfactory appearance of the support devices. Electronically Signed   By: Lovena Le M.D.   On: 06/25/2019 06:27   DG Abd Portable 1V  Result Date: 06/25/2019 CLINICAL DATA:  Feeding tube placement. EXAM: PORTABLE ABDOMEN - 1 VIEW COMPARISON:  Chest x-ray 06/25/2019, 06/24/2019. Abdomen 06/22/2019. FINDINGS: Left and right IJ line stable position. Feeding tube noted with tip over the left upper quadrant. No prominent bowel distention. Persistent bibasilar infiltrates. Lucencies noted over the right upper quadrant, right lower chest is most likely related to normal lung with adjacent infiltrates. Similar findings noted on prior exam. IMPRESSION: Feeding tube noted with tip over the left upper quadrant. Electronically Signed   By: Marcello Moores  Register   On: 06/25/2019 13:05   Overnight EEG with video  Result Date: 06/25/2019 Lora Havens, MD     06/25/2019 11:29 AM Patient Name: Steve Martinez MRN: [id number] Epilepsy Attending: Lora Havens Referring Physician/Provider: Dr Marshell Garfinkel Duration: 06/24/2019 1627 to 06/25/2019 1022 Patient history: 79 year old with hypertension, diabetes, CVA, CKD admitted with COVID-19 pneumonia, had  cardiorespiratory arrest Level of alertness: comatose AEDs during EEG study: None Technical aspects: This EEG study was done with scalp electrodes positioned according to the 10-20 International system of electrode placement. Electrical activity was acquired at a sampling rate of '500Hz'  and reviewed with a high frequency filter of '70Hz'  and a low frequency filter of '1Hz' . EEG data were recorded continuously and digitally stored. DESCRIPTION: EEG showed continuous generalized high amplitude rhythmic 3-'4Hz'  delta slowing, at times with triphasic morphology. Hyperventilation and photic stimulation were not performed. Of note, this was a technically difficult study due to significant myogenic artifact. ABNORMALITY - Continuous rhythmic slow, generalized IMPRESSION: This technically difficult study is suggestive of severe diffuse encephalopathy, non specific to etiology. No seizures or epileptiform discharges were seen throughout the recording. [place]: BMET Recent Labs  Lab 06/20/19 1600 06/21/19 0448 06/21/19 1840 06/23/19 0656 06/24/19 0800 06/24/19 0913 06/24/19 1819 06/24/19 2135 06/25/19 0351 06/25/19 1435 06/26/19 0333 06/26/19 1405  NA '140 138 136 142 142 138 139 138 138 137 ' 134* 134*  K 5.6* 5.1 4.7 5.7* 6.2* 6.1* 6.4* 6.0* 5.3* 5.0 4.2 3.8  CL 104 103 99 104 102  --  102 105 105 105 101  --   CO2 19* '23 24 22 ' 20*  --  19* 21* 22 20* 21*  --   GLUCOSE 242* 229* 243* 216* 232*  --  201* 188* 120* 127* 193*  --   BUN 62* 51* 42* 119* 102*  --  82* 70* 54* 49* 40*  --   CREATININE 3.85*  3.01* 2.28* 6.42* 6.26*  --  4.58* 3.93* 3.21* 2.80* 2.17*  --   CALCIUM 6.8* 6.8* 6.9* 6.5* 6.4*  --  6.6* 6.4* 6.8* 6.5* 6.5*  --   PHOS 5.9* 5.6* 5.3*  --   --   --  8.9*  --  6.7* 5.4* 4.1  --    CBC Recent Labs  Lab 06/23/19 0656 06/24/19 0344 06/24/19 0913 06/25/19 0351 06/26/19 0333 06/26/19 1405  WBC 12.1* 8.0  --  33.5* 32.0*  --   NEUTROABS 10.5* 6.9  --  30.7* 29.6*  --    HGB 8.6* 7.8* 6.1* 10.5* 8.9* 9.2*  HCT 27.3* 24.3* 18.0* 32.3* 26.9* 27.0*  MCV 83.2 82.1  --  83.2 82.8  --   PLT 137* 93*  --  103* 60*  --     Medications:    . vitamin C  500 mg Oral Daily  . B-complex with vitamin C  1 tablet Per Tube Daily  . chlorhexidine gluconate (MEDLINE KIT)  15 mL Mouth Rinse BID  . Chlorhexidine Gluconate Cloth  6 each Topical q morning - 10a  . feeding supplement (PRO-STAT SUGAR FREE 64)  30 mL Per Tube Daily  . insulin aspart  0-9 Units Subcutaneous Q4H  . mouth rinse  15 mL Mouth Rinse 10 times per day  . pantoprazole (PROTONIX) IV  40 mg Intravenous QHS  . rosuvastatin  40 mg Oral Daily  . sodium chloride flush  3 mL Intravenous Q12H  . zinc sulfate  220 mg Oral Daily      Madelon Lips, MD [facility] pgr [phone] 06/26/2019, 4:03 PM

## 2019-06-26 NOTE — Progress Notes (Signed)
Inpatient Diabetes Program Recommendations  AACE/ADA: New Consensus Statement on Inpatient Glycemic Control (2015)  Target Ranges:  Prepandial:   less than 140 mg/dL      Peak postprandial:   less than 180 mg/dL (1-2 hours)      Critically ill patients:  140 - 180 mg/dL   Lab Results  Component Value Date   GLUCAP 292 (H) 06/26/2019   HGBA1C 9.6 (H) 06/13/2019    Review of Glycemic Control  Diabetes history: DM 2 Outpatient Diabetes medications: 70/30 20-30 units bid Current orders for Inpatient glycemic control:  Novolog 0-9 units Q4 hours  Vital 1.5 Cal  55 ml/hour  Inpatient Diabetes Program Recommendations:    Tube feed rate increased to 55 ml/hour. Glucose trends increased into the upper 200 range.   Consider Novolog 4 units Q4 hours Tube Feed Coverage.  Thanks,  Tama Headings RN, MSN, BC-ADM Inpatient Diabetes Coordinator Team Pager [phone] (8a-5p)

## 2019-06-26 NOTE — Progress Notes (Signed)
Occupational Therapy Discharge Patient Details Name: Steve Martinez MRN: [id number] DOB: 1941-01-01 Today's Date: 06/26/2019 Time:  -     Patient discharged from OT services secondary to medical decline - will need to re-order OT to resume therapy services.  Please see latest therapy progress note for current level of functioning and progress toward goals.    Progress and discharge plan discussed with patient and/or caregiver: Patient unable to participate in discharge planning and no caregivers available.  Events of past several days noted.  If pt becomes medically appropriate, please reorder therapies.  Nilsa Nutting., OTR/L Acute Rehabilitation Services Pager [phone] Office [phone]   Gruver, [name] 06/26/2019, 6:17 AM

## 2019-06-26 NOTE — Progress Notes (Signed)
  Echocardiogram 2D Echocardiogram has been performed.  Darlina Sicilian M 06/26/2019, 11:03 AM

## 2019-06-26 NOTE — Progress Notes (Signed)
Carotid artery duplex has been completed. Preliminary results can be found in CV Proc through chart review.   06/26/19 4:42 PM Steve Martinez RVT

## 2019-06-26 NOTE — Progress Notes (Signed)
NAME:  Steve Martinez, MRN:  [id number], DOB:  08-Feb-1941, LOS: 16 ADMISSION DATE:  06/02/2019, CONSULTATION DATE:  06/18/2019 REFERRING MD:  Dr. Candiss Norse, CHIEF COMPLAINT:  Hypotension  Brief History    79 year old male with prior history of HTN, HLD, DMT2, GERD, CKD, CVA with left sided deficits/weakness admitted 12/23 to [facility] after progressive one weak history of worsening shortness of breath found to be in acute hypoxic respiratory failure and COVID- 19 positive after known exposure to wife with COVID.   He was admitted to [facility] and started on oxygen [place], decadron, and remdesivir with aggressive pulmonary hygiene and self proning.  Hospitalization complicated by aspiration event on 12/27 and started on unasyn, acute bilateral leg DVT started on heparin gtt, and worsening AKI with nephrology consulted 12/28.  Patient was transferred to [facility] on 12/29 and underwent placement of tunneled hemodialysis catheter with plans for intermittent dialysis. However, blood pressure continued to decline with concerns for worsening sepsis.  PCCM consulted for closer monitoring in ICU.   Past Medical History  HTN, HLD, DMT2, GERD, CVA with left sided deficits/weakness, MI, CKD  Significant Hospital Events   12/23 Admitted [place] 12/29 transfer [facility]/ TDC placement 12/30 did not tolerated HD (became unresponsive), started on CRRT, tocilizumab x 1 1/1 Oozing from HD cath  + - Lying in bed states he feels well but appears weak. Tolerating CRRT, 360ml removed.  1/2 Transfer out of ICU 1/4 PEA arrest on floor post HD, intubated and transferred back to ICU  Consults:  Nephrology 12/28 PCCM 12/29  Procedures:  12/29 Right TDC >> 1/4 ETT >  1/4  Lt IJ CVL >> 1/4 Rt radial A line  Significant Diagnostic Tests:  12/24 DVT BLE [place] >> Right: Findings consistent with acute deep vein thrombosis involving the right peroneal veins. No cystic structure found in the popliteal fossa. Left: Findings consistent with acute  superficial vein thrombosis involving the left small saphenous vein. No cystic structure found in the popliteal fossa.  12/26 renal US >> No acute renal findings and negative urinary bladder. Chronic medical renal disease suspected.  12/27 TTE >>  1. Left ventricular ejection fraction, by visual estimation, is >75%. The left ventricle has hyperdynamic function. There is no left ventricular hypertrophy.  2. Left ventricular diastolic parameters are normal for age. The "impaired relaxation" pattern of mitral inflow is probably due to low left atrial pressures ("underfilling") and increased cardiac output.  3. The left ventricle has no regional wall motion abnormalities.  4. Global right ventricle has hyperdynamic systolic function.The right ventricular size is normal. No increase in right ventricular wall thickness.  5. Left atrial size was normal.  6. Right atrial size was normal.  7. The mitral valve is normal in structure. No evidence of mitral valve regurgitation.  8. The tricuspid valve is normal in structure.  9. The aortic valve is normal in structure. Aortic valve regurgitation is not visualized. Mild aortic valve sclerosis without stenosis. 10. The pulmonic valve was grossly normal. Pulmonic valve regurgitation is not visualized. 11. Normal pulmonary artery systolic pressure. 12. The inferior vena cava is collapsed, consistent with low right atrial pressure.  1/5 CT head: Chronic microvascular changes.  Scattered lacunar infarcts of indeterminate age.  Micro Data:  12/23 SARS Coronavirus 2 POSITIVE 12/23 Blood Negative 12/23 Urine multiple species 1/5 respiratory-rare WBC no organism 1/5 blood cultures-negative  Antimicrobials:  Amp/Sulbactam 12/27>> Ceftriaxone 12/23 x 1 dose Doxycycline 12/23 x 1 dose Remdesivir 12/23>>12/27 Decadron 12/23>> x10 days  Tocilizumab 12/30  X 1   Interim history/subjective:  Remains on the ventilator.  CVVH initiated. No seizures by EEG No  overnight events  Objective   Blood pressure (!) 123/53, pulse 78, temperature (!) 96.8 F (36 C), resp. rate (!) 35, height 6\' 1"  (1.854 m), weight 71.5 kg, SpO2 100 %.    Vent Mode: PRVC FiO2 (%):  [60 %-80 %] 60 % Set Rate:  [35 bmp] 35 bmp Vt Set:  [470 mL] 470 mL PEEP:  [5 cmH20] 5 cmH20 Plateau Pressure:  [27 cmH20-30 cmH20] 30 cmH20   Intake/Output Summary (Last 24 hours) at 06/26/2019 0937 Last data filed at 06/26/2019 0900 Gross per 24 hour  Intake 2239.16 ml  Output 1621 ml  Net 618.16 ml   Filed Weights   06/24/19 0210 06/25/19 0426 06/26/19 0409  Weight: 67 kg 70.2 kg 71.5 kg   Physical Exam: Gen:      Cachectic man, intubated sedated. HEENT: Sclera anicteric.  Poor dentition.  Small bore NG tube in place.  ET tube in place.  No pressure ulceration Lungs:    Clear to auscultation bilaterally CV:         Regular rate and rhythm; no murmurs.  Extremities warm. Abd:      + bowel sounds; soft, non-tender; no palpable masses, no distension Ext:    No edema; adequate peripheral perfusion Skin:      Warm and dry; no rash Neuro: Unersponsive.  Blinks spontaneously.  Does not follow commands.  No response to painful stimuli.  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 Was critically ill critically ill due to septic shock due to Covid pneumonia and post arrest state. 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 Type 2 diabetes.  Poor neurological response at this time and at high risk for significant cerebral injury.  However fentanyl infusion in context of renal failure is a significant confounder.  Now the patient is off pressors should be able to start net fluid removal.   Daily Goals Checklist  Pain/Anxiety/Delirium protocol (if indicated): Hold  IV fentanyl infusion.  Allow patient to wake up VAP protocol (if indicated): Bundle in place Respiratory support goals: ABG.  Wean as tolerated. Blood pressure target: On norepinephrine and vasopressin.  Wean vasopressin off first to keep MAP greater than 65 DVT prophylaxis: Currently on hold due to oral bleed Nutrition Status: High nutritional risk with severe malnutrition Nutrition Problem: Inadequate oral intake Etiology: acute illness, dysphagia Signs/Symptoms: NPO status Interventions: Tube feeding GI prophylaxis: Pantoprazole Fluid status goals: Continue CRRT no net fluid removal Urinary catheter: Removed as patient anuric. Central line: Left triple-lumen internal jugular.,  Arterial line.  Tunneled dialysis catheter. Glucose control: Type 2 diabetes with adequate glycemic control. Mobility/therapy needs: Bedrest Antibiotic de-escalation: Started on cefepime and vancomycin for severe leukocytosis. Home medication reconciliation: On home statin Daily labs: CRRT labs. Code Status: Full code. Family Communication: Ongoing discussions regarding goals of care with family given cardiac arrest. Disposition: ICU  CRITICAL CARE Performed by: Kipp Brood   Total critical care time: 40 minutes  Critical care time was exclusive of separately billable procedures and treating other patients.  Critical care was necessary to treat or prevent imminent or life-threatening deterioration.  Critical care was time spent personally by me on the following activities: development of treatment plan with  patient and/or surrogate as well as nursing, discussions with consultants, evaluation of patient's response to treatment, examination of patient, obtaining history from patient or surrogate, ordering and performing treatments and interventions, ordering and review of laboratory studies, ordering and review of radiographic studies, pulse oximetry, re-evaluation of patient's condition and participation  in multidisciplinary rounds.  Kipp Brood, MD [facility] ICU Physician [place]  Pager: [phone] Mobile: [phone] After hours: [phone].

## 2019-06-27 DIAGNOSIS — K921 Melena: Secondary | ICD-10-CM

## 2019-06-27 DIAGNOSIS — D62 Acute posthemorrhagic anemia: Secondary | ICD-10-CM

## 2019-06-27 LAB — POCT I-STAT 7, (LYTES, BLD GAS, ICA,H+H)
Acid-base deficit: 3 mmol/L — ABNORMAL HIGH (ref 0.0–2.0)
Acid-base deficit: 4 mmol/L — ABNORMAL HIGH (ref 0.0–2.0)
Bicarbonate: 25.1 mmol/L (ref 20.0–28.0)
Bicarbonate: 22.9 mmol/L (ref 20.0–28.0)
Calcium, Ion: 1.01 mmol/L — ABNORMAL LOW (ref 1.15–1.40)
Calcium, Ion: 1.04 mmol/L — ABNORMAL LOW (ref 1.15–1.40)
HCT: 26 % — ABNORMAL LOW (ref 39.0–52.0)
HCT: 31 % — ABNORMAL LOW (ref 39.0–52.0)
Hemoglobin: 8.8 g/dL — ABNORMAL LOW (ref 13.0–17.0)
Hemoglobin: 10.5 g/dL — ABNORMAL LOW (ref 13.0–17.0)
O2 Saturation: 94 %
O2 Saturation: 84 %
Patient temperature: 97.1
Patient temperature: 95
Potassium: 4 mmol/L (ref 3.5–5.1)
Potassium: 4.1 mmol/L (ref 3.5–5.1)
Sodium: 138 mmol/L (ref 135–145)
Sodium: 139 mmol/L (ref 135–145)
TCO2: 27 mmol/L (ref 22–32)
TCO2: 24 mmol/L (ref 22–32)
pCO2 arterial: 60.3 mmHg — ABNORMAL HIGH (ref 32.0–48.0)
pCO2 arterial: 46 mmHg (ref 32.0–48.0)
pH, Arterial: 7.223 — ABNORMAL LOW (ref 7.350–7.450)
pH, Arterial: 7.295 — ABNORMAL LOW (ref 7.350–7.450)
pO2, Arterial: 81 mmHg — ABNORMAL LOW (ref 83.0–108.0)
pO2, Arterial: 49 mmHg — ABNORMAL LOW (ref 83.0–108.0)

## 2019-06-27 LAB — GLUCOSE, CAPILLARY
Glucose-Capillary: 283 mg/dL — ABNORMAL HIGH (ref 70–99)
Glucose-Capillary: 265 mg/dL — ABNORMAL HIGH (ref 70–99)
Glucose-Capillary: 263 mg/dL — ABNORMAL HIGH (ref 70–99)
Glucose-Capillary: 184 mg/dL — ABNORMAL HIGH (ref 70–99)
Glucose-Capillary: 216 mg/dL — ABNORMAL HIGH (ref 70–99)
Glucose-Capillary: 214 mg/dL — ABNORMAL HIGH (ref 70–99)

## 2019-06-27 LAB — RENAL FUNCTION PANEL
Albumin: 1.5 g/dL — ABNORMAL LOW (ref 3.5–5.0)
Albumin: 1.5 g/dL — ABNORMAL LOW (ref 3.5–5.0)
Anion gap: 12 (ref 5–15)
Anion gap: 9 (ref 5–15)
BUN: 35 mg/dL — ABNORMAL HIGH (ref 8–23)
BUN: 27 mg/dL — ABNORMAL HIGH (ref 8–23)
CO2: 23 mmol/L (ref 22–32)
CO2: 22 mmol/L (ref 22–32)
Calcium: 6.7 mg/dL — ABNORMAL LOW (ref 8.9–10.3)
Calcium: 6.5 mg/dL — ABNORMAL LOW (ref 8.9–10.3)
Chloride: 103 mmol/L (ref 98–111)
Chloride: 105 mmol/L (ref 98–111)
Creatinine, Ser: 1.69 mg/dL — ABNORMAL HIGH (ref 0.61–1.24)
Creatinine, Ser: 1.52 mg/dL — ABNORMAL HIGH (ref 0.61–1.24)
GFR calc Af Amer: 44 mL/min — ABNORMAL LOW (ref >60)
GFR calc Af Amer: 50 mL/min — ABNORMAL LOW (ref >60)
GFR calc non Af Amer: 38 mL/min — ABNORMAL LOW (ref >60)
GFR calc non Af Amer: 43 mL/min — ABNORMAL LOW (ref >60)
Glucose, Bld: 289 mg/dL — ABNORMAL HIGH (ref 70–99)
Glucose, Bld: 172 mg/dL — ABNORMAL HIGH (ref 70–99)
Phosphorus: 2.9 mg/dL (ref 2.5–4.6)
Phosphorus: 3.2 mg/dL (ref 2.5–4.6)
Potassium: 4.3 mmol/L (ref 3.5–5.1)
Potassium: 4 mmol/L (ref 3.5–5.1)
Sodium: 138 mmol/L (ref 135–145)
Sodium: 136 mmol/L (ref 135–145)

## 2019-06-27 LAB — PROTIME-INR
INR: 2.6 — ABNORMAL HIGH (ref 0.8–1.2)
Prothrombin Time: 28 seconds — ABNORMAL HIGH (ref 11.4–15.2)

## 2019-06-27 LAB — CULTURE, RESPIRATORY W GRAM STAIN: Special Requests: NORMAL

## 2019-06-27 LAB — PREPARE RBC (CROSSMATCH)

## 2019-06-27 LAB — CBC
HCT: 26.3 % — ABNORMAL LOW (ref 39.0–52.0)
Hemoglobin: 8.6 g/dL — ABNORMAL LOW (ref 13.0–17.0)
MCH: 28 pg (ref 26.0–34.0)
MCHC: 32.7 g/dL (ref 30.0–36.0)
MCV: 85.7 fL (ref 80.0–100.0)
Platelets: 65 10*3/uL — ABNORMAL LOW (ref 150–400)
RBC: 3.07 MIL/uL — ABNORMAL LOW (ref 4.22–5.81)
RDW: 15.8 % — ABNORMAL HIGH (ref 11.5–15.5)
WBC: 40.2 10*3/uL — ABNORMAL HIGH (ref 4.0–10.5)
nRBC: 1.8 % — ABNORMAL HIGH (ref 0.0–0.2)

## 2019-06-27 LAB — MAGNESIUM: Magnesium: 2.7 mg/dL — ABNORMAL HIGH (ref 1.7–2.4)

## 2019-06-27 LAB — PROCALCITONIN: Procalcitonin: 1.32 ng/mL

## 2019-06-27 MED ORDER — ZINC SULFATE 220 (50 ZN) MG PO CAPS
220.0000 mg | ORAL_CAPSULE | Freq: Every day | ORAL | Status: DC
  Administered 2019-06-27 – 2019-06-30 (×4): 220 mg
  Filled 2019-06-27 (×3): qty 1

## 2019-06-27 MED ORDER — PANTOPRAZOLE SODIUM 40 MG IV SOLR
40.0000 mg | Freq: Two times a day (BID) | INTRAVENOUS | Status: DC
  Administered 2019-06-27 – 2019-06-30 (×6): 40 mg via INTRAVENOUS
  Filled 2019-06-27 (×6): qty 40

## 2019-06-27 MED ORDER — ASCORBIC ACID 500 MG PO TABS
500.0000 mg | ORAL_TABLET | Freq: Every day | ORAL | Status: DC
  Administered 2019-06-27 – 2019-06-30 (×4): 500 mg
  Filled 2019-06-27 (×3): qty 1

## 2019-06-27 MED ORDER — INSULIN DETEMIR 100 UNIT/ML ~~LOC~~ SOLN
10.0000 [IU] | Freq: Two times a day (BID) | SUBCUTANEOUS | Status: DC
  Administered 2019-06-27 – 2019-06-29 (×4): 10 [IU] via SUBCUTANEOUS
  Filled 2019-06-27 (×6): qty 0.1

## 2019-06-27 MED ORDER — VASOPRESSIN 20 UNIT/ML IV SOLN
0.0300 [IU]/min | INTRAVENOUS | Status: DC
  Administered 2019-06-27 – 2019-07-01 (×3): 0.03 [IU]/min via INTRAVENOUS
  Filled 2019-06-27 (×3): qty 2

## 2019-06-27 MED ORDER — SODIUM CHLORIDE 0.9% IV SOLUTION: Freq: Once | INTRAVENOUS | Status: DC

## 2019-06-27 MED ORDER — PRISMASOL BGK 4/2.5 32-4-2.5 MEQ/L REPLACEMENT SOLN
Status: DC
  Filled 2019-06-27 (×11): qty 5000

## 2019-06-27 MED ORDER — DARBEPOETIN ALFA 60 MCG/0.3ML IJ SOSY
60.0000 ug | PREFILLED_SYRINGE | INTRAMUSCULAR | Status: DC
  Administered 2019-06-27: 60 ug via INTRAVENOUS
  Filled 2019-06-27 (×2): qty 0.3

## 2019-06-27 MED ORDER — ALBUMIN HUMAN 25 % IV SOLN
25.0000 g | Freq: Once | INTRAVENOUS | Status: AC
  Administered 2019-06-27: 25 g via INTRAVENOUS
  Filled 2019-06-27: qty 100

## 2019-06-27 MED ORDER — ROSUVASTATIN CALCIUM 20 MG PO TABS
40.0000 mg | ORAL_TABLET | Freq: Every day | ORAL | Status: DC
  Administered 2019-06-27 – 2019-07-01 (×5): 40 mg
  Filled 2019-06-27: qty 8
  Filled 2019-06-27 (×2): qty 2
  Filled 2019-06-27 (×3): qty 8
  Filled 2019-06-27 (×3): qty 2

## 2019-06-27 NOTE — Progress Notes (Signed)
Patient became severely hypotensive even with Levophed dose max.  Also, had a large bloody BM.  MD notified.  Orders received to get CBC, BMET; and give Albumin, 1 liter Bolus of NS and 1 unit of PRBCs and start Vasopressin.   MD at bedside monitoring patient.  Patient's BP is stabilized.  Keeping patient even with CRRT.  Will continue to monitor patient.

## 2019-06-27 NOTE — Progress Notes (Signed)
Had to decrease pull r/t decreased diastolic pressure .  Now only pulling 62ml/hr on crrt.  Diastolic still 35 and map 51.  Systolic [id number].  Will use levo to improve diastolic if possible

## 2019-06-27 NOTE — Progress Notes (Signed)
[facility] Progress Note    Assessment/ Plan:   1. 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  Latest Cr was around 3 before the holidays.  Admitted 12/24 w/ creat 3.6 which worsened up to 6-7 range. UA and renal US were negative and pt had to be initiated on CRRT.  D/c'd 06/21/2019 but had to reintiate 1/4 in setting of hyperkalemia following arrest.  K 4.3 this AM, change all to 4K dialysate.  BID labs. Maintain CRRT for now with hypotension. No heparin.  UF to net even to slightly net neg.  2. DM2 3. HTN: currently hypotensive. 4. VRDR: COVID+ PNA, intubated now; FiO2 down to 60% this AM 5. Acute bilateral DVT- Coumadin on hold for now d/t bleeding complications 6. Shock: presumed septic, broad spectrum antibiotics per primar 7. CVA: MRI d/c'd, neurology follow.   8. Anemia:  Having some bleeding here - mainly oozing now.  Hb 9.2 this AM.  S/p 2u pRBC on 1/4.  Start aranesp 60 qThurs.   Subjective:    Now responding, MRI D/Cd.  Not requiring sedation.  On NE gtt at 24 CRRT running fine.    Objective:   BP (!) 117/45   Pulse 84   Temp (!) 97.2 F (36.2 C)   Resp 19   Ht _0  (1.854 m)   Wt 72 kg   SpO2 91%   BMI 20.94 kg/m   Intake/Output Summary (Last 24 hours) at 06/27/2019 0859 Last data filed at 06/27/2019 0800 Gross per 24 hour  Intake 2351.29 ml  Output 3261 ml  Net -909.71 ml   Weight change: 0.5 kg  Physical Exam: GEN: intubated and sedated on vent HEENT EOMI PERRL NECK no JVD PULM bilateral rhonchi CV RRR ABD soft EXT trace edema NEURO AAO x 3 ACCESS: R IJ [facility] c/d/i   Imaging: CT HEAD WO CONTRAST  Result Date: 06/25/2019 CLINICAL DATA:  Altered mental status. COVID 19 positive. EXAM: CT HEAD WITHOUT CONTRAST TECHNIQUE: Contiguous axial images were obtained from the base of the skull through the vertex without intravenous contrast. COMPARISON:  Report of CT head without contrast 07/24/1999. Images are not available. FINDINGS: Brain: Nonhemorrhagic  lacunar infarcts are present in the thalami bilaterally. Additional lacunar infarcts are present in the right lentiform nucleus. Moderate generalized atrophy and diffuse white matter disease is present. Remote nonhemorrhagic lacunar infarcts are present in the occipital poles bilaterally. Left PICA territory nonhemorrhagic infarct appears acute/subacute. No acute hemorrhage or mass lesion is present. The ventricles are of normal size. No significant extraaxial fluid collection is present. Prominent ossification is present along the falx. Vascular: Atherosclerotic changes are present within the cavernous internal carotid arteries bilaterally. No significant asymmetric hyperdensity is present. Skull: Calvarium is intact. No focal lytic or blastic lesions are present. No significant extracranial soft tissue lesion is present. Sinuses/Orbits: Fluid levels are present in the maxillary sinuses and sphenoid sinuses bilaterally. There is fluid in the nasopharynx. Diffuse mucosal thickening is present throughout the ethmoid air cells. There is fluid in the ethmoid air cells and frontal sinuses bilaterally. The mastoid air cells and middle ear cavities are clear. NG tube enters on the left. The patient is intubated. Bilateral lens replacements are noted. Globes and orbits are otherwise unremarkable. IMPRESSION: 1. Nonhemorrhagic left PICA territory infarct appears acute/subacute. 2. Multiple remote lacunar infarcts within the basal ganglia bilaterally. Infarcts of the thalami are more indeterminate. MRI could be used for further evaluation of infarct acuity. 3. Moderate generalized atrophy and diffuse  white matter disease likely reflects the sequela of chronic microvascular ischemia. 4. No acute hemorrhage or mass lesion. These results will be called to the ordering clinician or representative by the Radiologist Assistant, and communication documented in the PACS or zVision Dashboard. Electronically Signed   By: San Morelle M.D.   On: 06/25/2019 11:50   DG Abd Portable 1V  Result Date: 06/25/2019 CLINICAL DATA:  Feeding tube placement. EXAM: PORTABLE ABDOMEN - 1 VIEW COMPARISON:  Chest x-ray 06/25/2019, 06/24/2019. Abdomen 06/22/2019. FINDINGS: Left and right IJ line stable position. Feeding tube noted with tip over the left upper quadrant. No prominent bowel distention. Persistent bibasilar infiltrates. Lucencies noted over the right upper quadrant, right lower chest is most likely related to normal lung with adjacent infiltrates. Similar findings noted on prior exam. IMPRESSION: Feeding tube noted with tip over the left upper quadrant. Electronically Signed   By: Marcello Moores  Register   On: 06/25/2019 13:05   Overnight EEG with video  Result Date: 06/25/2019 Lora Havens, MD     06/25/2019 11:29 AM Patient Name: Steve Martinez MRN: [id number] Epilepsy Attending: Lora Havens Referring Physician/Provider: Dr Marshell Garfinkel Duration: 06/24/2019 1627 to 06/25/2019 1022 Patient history: 79 year old with hypertension, diabetes, CVA, CKD admitted with COVID-19 pneumonia, had cardiorespiratory arrest Level of alertness: comatose AEDs during EEG study: None Technical aspects: This EEG study was done with scalp electrodes positioned according to the 10-20 International system of electrode placement. Electrical activity was acquired at a sampling rate of _0  and reviewed with a high frequency filter of _1  and a low frequency filter of _2 . EEG data were recorded continuously and digitally stored. DESCRIPTION: EEG showed continuous generalized high amplitude rhythmic 3-_3  delta slowing, at times with triphasic morphology. Hyperventilation and photic stimulation were not performed. Of note, this was a technically difficult study due to significant myogenic artifact. ABNORMALITY - Continuous rhythmic slow, generalized IMPRESSION: This technically difficult study is suggestive of severe diffuse encephalopathy, non specific to  etiology. No seizures or epileptiform discharges were seen throughout the recording. Priyanka O Yadav   VAS US CAROTID  Result Date: 06/27/2019 Carotid Arterial Duplex Study Indications:       CVA. Other Factors:     COVID 19 positive. Limitations        Today's exam was limited due to a central line. Comparison Study:  No prior studies. Performing Technologist: Oliver Hum RVT  Examination Guidelines: A complete evaluation includes B-mode imaging, spectral Doppler, color Doppler, and power Doppler as needed of all accessible portions of each vessel. Bilateral testing is considered an integral part of a complete examination. Limited examinations for reoccurring indications may be performed as noted.  Right Carotid Findings: +----------+--------+--------+--------+-----------------------+--------+           PSV cm/sEDV cm/sStenosisPlaque Description     Comments +----------+--------+--------+--------+-----------------------+--------+ CCA Prox  128     16              smooth and heterogenous         +----------+--------+--------+--------+-----------------------+--------+ CCA Distal74      10              smooth and heterogenous         +----------+--------+--------+--------+-----------------------+--------+ ICA Prox  84      20              smooth and heterogenous         +----------+--------+--------+--------+-----------------------+--------+ ICA Distal102     34                                              +----------+--------+--------+--------+-----------------------+--------+  ECA       80      10                                              +----------+--------+--------+--------+-----------------------+--------+ +----------+--------+-------+--------+-------------------+           PSV cm/sEDV cmsDescribeArm Pressure (mmHG) +----------+--------+-------+--------+-------------------+ Subclavian132                                         +----------+--------+-------+--------+-------------------+ +---------+--------+--+--------+--+---------+ VertebralPSV cm/s42EDV cm/s13Antegrade +---------+--------+--+--------+--+---------+  Left Carotid Findings: +----------+--------+--------+--------+------------------+---------------------+           PSV cm/sEDV cm/sStenosisPlaque DescriptionComments              +----------+--------+--------+--------+------------------+---------------------+ CCA Prox                                            Proximal CCA is not                                                       visualized.           +----------+--------+--------+--------+------------------+---------------------+ CCA Distal                                          Not visualized        +----------+--------+--------+--------+------------------+---------------------+ ICA Prox                                            Not visualized        +----------+--------+--------+--------+------------------+---------------------+ ICA Distal                                          Not visualized        +----------+--------+--------+--------+------------------+---------------------+ ECA                                                 Not visualized        +----------+--------+--------+--------+------------------+---------------------+ +----------+--------+--------+--------+-------------------+           PSV cm/sEDV cm/sDescribeArm Pressure (mmHG) +----------+--------+--------+--------+-------------------+ [id number]                                         +----------+--------+--------+--------+-------------------+ +---------+--------+--------+--------------+ VertebralPSV cm/sEDV cm/sNot identified +---------+--------+--------+--------------+  Summary: Right Carotid: Velocities in the right ICA are consistent with a 1-39% stenosis. Left Carotid: Unable to obtain left sided images due to central line  and  bandages. Vertebrals: Right vertebral artery demonstrates antegrade flow. *See table(s) above for measurements and observations.  Electronically signed by Antony Contras MD on 06/27/2019 at 8:09:47 AM.    Final    ECHOCARDIOGRAM LIMITED  Result Date: 06/26/2019   ECHOCARDIOGRAM LIMITED REPORT   Patient Name:   Steve Martinez Date of Exam: 06/26/2019 Medical Rec #:  [id number]   Height:       73.0 in Accession #:    [id number]  Weight:       157.6 lb Date of Birth:  08-24-1940   BSA:          1.94 m Patient Age:    36 years    BP:           124/50 mmHg Patient Gender: M           HR:           82 bpm. Exam Location:  Inpatient  Procedure: Limited Echo, Cardiac Doppler and Limited Color Doppler Indications:    Stroke 434.91 / I163.9  History:        Patient has prior history of Echocardiogram examinations, most                 recent 06/16/2019. COVID 19. GERD. Acute hypoxic respiratory                 failure. 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  Sonographer:    Darlina Sicilian RDCS Referring Phys: [id number] [place]  Sonographer Comments: Technically difficult study due to poor echo windows, suboptimal parasternal window, suboptimal apical window and echo performed with patient supine and on artificial respirator. Image acquisition challenging due to respiratory motion. IMPRESSIONS  1. Technically difficult study, very limited images  2. Left ventricular ejection fraction, by visual estimation, is 65 to 70%. The left ventricle has hyperdynamic function.  3. Global right ventricle has grossly normal size and systolic function. FINDINGS  Left Ventricle: Left ventricular ejection fraction, by visual estimation, is 65 to 70%. The left ventricle has hyperdynamic function. Right Ventricle: The right ventricular size is normal. Global RV systolic function is has normal systolic function. The tricuspid regurgitant velocity is 2.47 m/s, and with an assumed right atrial pressure of 8 mmHg, the estimated right  ventricular systolic pressure is mildly elevated at 32.4 mmHg. Mitral Valve: MV Area by PHT, 2.00 cm. MV PHT, 110.20 msec. No evidence of mitral valve regurgitation. Tricuspid Valve: Tricuspid valve regurgitation is trivial. Aortic Valve: The aortic valve is abnormal. . There is moderate thickening and moderate calcification of the aortic valve. There is moderate thickening of the aortic valve. There is moderate calcification of the aortic valve.   Diastology                 Normals LV e' lateral:   3.70 cm/s 6.42 cm/s LV E/e' lateral: 13.7      15.4 LV e' medial:    4.35 cm/s 6.96 cm/s LV E/e' medial:  11.6      6.96  MITRAL VALVE               Normals  TRICUSPID VALVE             Normals MV Area (PHT): 2.00 cm             TR Peak grad:   24.4 mmHg MV PHT:        110.20 msec 55 ms    TR Vmax:  247.00 cm/s 288 cm/s MV Decel Time: 380 msec    187 ms MV E velocity: 50.60 cm/s 103 cm/s MV A velocity: 37.30 cm/s 70.3 cm/s MV E/A ratio:  1.36       1.5  Oswaldo Milian MD Electronically signed by Oswaldo Milian MD Signature Date/Time: 06/26/2019/4:34:06 PM    Final     Labs: BMET Recent Labs  Lab 06/21/19 1840 06/24/19 1819 06/24/19 2135 06/25/19 0351 06/25/19 1435 06/26/19 0333 06/26/19 1405 06/26/19 1729 06/26/19 2202 06/27/19 0417  NA 136 139 138 138 137 134* 134* 133* 136 138  K 4.7 6.4* 6.0* 5.3* 5.0 4.2 3.8 4.0 3.8 4.3  CL 99 102 105 105 105 101  --  101  --  103  CO2 24 19* 21* 22 20* 21*  --  20*  --  23  GLUCOSE 243* 201* 188* 120* 127* 193*  --  402*  --  289*  BUN 42* 82* 70* 54* 49* 40*  --  38*  --  35*  CREATININE 2.28* 4.58* 3.93* 3.21* 2.80* 2.17*  --  1.83*  --  1.69*  CALCIUM 6.9* 6.6* 6.4* 6.8* 6.5* 6.5*  --  6.4*  --  6.7*  PHOS 5.3* 8.9*  --  6.7* 5.4* 4.1  --  3.5  --  2.9   CBC Recent Labs  Lab 06/23/19 0656 06/24/19 0344 06/25/19 0351 06/26/19 0333 06/26/19 1405 06/26/19 2202  WBC 12.1* 8.0 33.5* 32.0*  --   --   NEUTROABS 10.5* 6.9 30.7* 29.6*   --   --   HGB 8.6* 7.8* 10.5* 8.9* 9.2* 9.2*  HCT 27.3* 24.3* 32.3* 26.9* 27.0* 27.0*  MCV 83.2 82.1 83.2 82.8  --   --   PLT 137* 93* 103* 60*  --   --     Medications:    . vitamin C  500 mg Oral Daily  . B-complex with vitamin C  1 tablet Per Tube Daily  . chlorhexidine gluconate (MEDLINE KIT)  15 mL Mouth Rinse BID  . Chlorhexidine Gluconate Cloth  6 each Topical q morning - 10a  . feeding supplement (PRO-STAT SUGAR FREE 64)  30 mL Per Tube Daily  . insulin aspart  0-9 Units Subcutaneous Q4H  . insulin detemir  10 Units Subcutaneous BID  . mouth rinse  15 mL Mouth Rinse 10 times per day  . pantoprazole (PROTONIX) IV  40 mg Intravenous QHS  . rosuvastatin  40 mg Oral Daily  . sodium chloride flush  3 mL Intravenous Q12H  . zinc sulfate  220 mg Oral Daily      Madelon Lips, MD [place] pgr [phone] 06/27/2019, 8:59 AM

## 2019-06-27 NOTE — Progress Notes (Signed)
NAME:  Steve Martinez, MRN:  [id number], DOB:  [birth year], LOS: 63 ADMISSION DATE:  06/19/2019, CONSULTATION DATE:  06/18/2019 REFERRING MD:  Dr. Candiss Norse, CHIEF COMPLAINT:  Hypotension  Brief History    79 year old male with prior history of HTN, HLD, DMT2, GERD, CKD, CVA with left sided deficits/weakness admitted 12/23 to [facility] after progressive one weak history of worsening shortness of breath found to be in acute hypoxic respiratory failure and COVID- 19 positive after known exposure to wife with COVID.   He was admitted to [facility] and started on oxygen [place], decadron, and remdesivir with aggressive pulmonary hygiene and self proning.  Hospitalization complicated by aspiration event on 12/27 and started on unasyn, acute bilateral leg DVT started on heparin gtt, and worsening AKI with nephrology consulted 12/28.  Patient was transferred to [facility] on 12/29 and underwent placement of tunneled hemodialysis catheter with plans for intermittent dialysis. However, blood pressure continued to decline with concerns for worsening sepsis.  PCCM consulted for closer monitoring in ICU.   Past Medical History  HTN, HLD, DMT2, GERD, CVA with left sided deficits/weakness, MI, CKD  Significant Hospital Events   12/23 Admitted [place] 12/29 transfer [facility]/ TDC placement 12/30 did not tolerated HD (became unresponsive), started on CRRT, tocilizumab x 1 1/1 Oozing from HD cath  + - Lying in bed states he feels well but appears weak. Tolerating CRRT, 353ml removed.  1/2 Transfer out of ICU 1/4 PEA arrest on floor post HD, intubated and transferred back to ICU  Consults:  Nephrology 12/28 PCCM 12/29  Procedures:  12/29 Right TDC >> 1/4 ETT >  1/4  Lt IJ CVL >> 1/4 Rt radial A line  Significant Diagnostic Tests:  12/24 DVT BLE [place] >> Right: Findings consistent with acute deep vein thrombosis involving the right peroneal veins. No cystic structure found in the popliteal fossa. Left: Findings consistent with acute  superficial vein thrombosis involving the left small saphenous vein. No cystic structure found in the popliteal fossa.  12/26 renal US >> No acute renal findings and negative urinary bladder. Chronic medical renal disease suspected.  12/27 TTE >>  1. Left ventricular ejection fraction, by visual estimation, is >75%. The left ventricle has hyperdynamic function. There is no left ventricular hypertrophy.  2. Left ventricular diastolic parameters are normal for age. The "impaired relaxation" pattern of mitral inflow is probably due to low left atrial pressures ("underfilling") and increased cardiac output.  3. The left ventricle has no regional wall motion abnormalities.  4. Global right ventricle has hyperdynamic systolic function.The right ventricular size is normal. No increase in right ventricular wall thickness.  5. Left atrial size was normal.  6. Right atrial size was normal.  7. The mitral valve is normal in structure. No evidence of mitral valve regurgitation.  8. The tricuspid valve is normal in structure.  9. The aortic valve is normal in structure. Aortic valve regurgitation is not visualized. Mild aortic valve sclerosis without stenosis. 10. The pulmonic valve was grossly normal. Pulmonic valve regurgitation is not visualized. 11. Normal pulmonary artery systolic pressure. 12. The inferior vena cava is collapsed, consistent with low right atrial pressure.  1/5 CT head: Chronic microvascular changes.  Scattered lacunar infarcts of indeterminate age.  Micro Data:  12/23 SARS Coronavirus 2 POSITIVE 12/23 Blood Negative 12/23 Urine multiple species 1/5 respiratory-rare WBC no organism 1/5 blood cultures-negative  Antimicrobials:  Amp/Sulbactam 12/27>> Ceftriaxone 12/23 x 1 dose Doxycycline 12/23 x 1 dose Remdesivir 12/23>>12/27 Decadron 12/23>> x10 days  Tocilizumab 12/30  X 1   Interim history/subjective:  Now awake and following commands.  Very weak.  Objective   Blood  pressure (!) 99/49, pulse 85, temperature (!) 96.8 F (36 C), resp. rate 17, height 6\' 1"  (1.854 m), weight 72 kg, SpO2 99 %.    Vent Mode: PRVC FiO2 (%):  [40 %-100 %] 60 % Set Rate:  [20 bmp-35 bmp] 20 bmp Vt Set:  [470 mL] 470 mL PEEP:  [5 cmH20-7 cmH20] 7 cmH20 Plateau Pressure:  [30 cmH20-32 cmH20] 32 cmH20   Intake/Output Summary (Last 24 hours) at 06/27/2019 1036 Last data filed at 06/27/2019 0900 Gross per 24 hour  Intake 2199.85 ml  Output 3124 ml  Net -924.15 ml   Filed Weights   06/25/19 0426 06/26/19 0409 06/27/19 0413  Weight: 70.2 kg 71.5 kg 72 kg   Physical Exam: Gen:      Cachectic man, intubated sedated. HEENT: Sclera anicteric.  Poor dentition.  Small bore NG tube in place.  ET tube in place.  No pressure ulceration Lungs:    Clear to auscultation bilaterally CV:         Regular rate and rhythm; no murmurs.  Extremities warm. Abd:      + bowel sounds; soft, non-tender; no palpable masses, no distension Ext:    No edema; adequate peripheral perfusion Skin:      Warm and dry; no rash Neuro: Unersponsive.  Blinks spontaneously.  Does not follow commands.  No response to painful stimuli.  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 Was critically ill critically ill due to septic shock due to Covid pneumonia and post arrest state. 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 Type 2 diabetes.  Prolonged critical illness due to Covid.  Appears patient is cachectic at baseline so recovery will be slow.  He is following commands weakly and appears to have made a reasonable recovery from his cardiac arrest.  Still in severe respiratory failure with a higher oxygen requirements.  Continue to wean as tolerated.  Initiate fluid removal with  CRRT.   Daily Goals Checklist  Pain/Anxiety/Delirium protocol (if indicated): As needed Dilaudid only  VAP protocol (if indicated): Bundle in place Respiratory support goals: Have decreased FiO2, increase PEEP, decreased respiratory rate.  Continue to wean FiO2 as tolerated and initiate SBT when meets criteria. Blood pressure target: On norepinephrine alone.  Wean to keep MAP greater than 65 DVT prophylaxis: We will resume IV heparin Nutrition Status: High nutritional risk with severe malnutrition Nutrition Problem: Inadequate oral intake Etiology: acute illness, dysphagia Signs/Symptoms: NPO status Interventions: Tube feeding GI prophylaxis: Pantoprazole Fluid status goals: Continue CRRT no net fluid removal Urinary catheter: Removed as patient anuric. Central line: Left triple-lumen internal jugular.,  Arterial line.  Tunneled dialysis catheter. Glucose control: Type 2 diabetes with adequate glycemic control. Mobility/therapy needs: Bedrest Antibiotic de-escalation: Started on cefepime and vancomycin for severe leukocytosis.  Culture results pending Home medication reconciliation: On home statin Daily labs: CRRT labs. Code Status: Full code. Family Communication: Updated family yesterday  Disposition: ICU  CRITICAL CARE Performed by: Kipp Brood   Total critical care time: 40 minutes  Critical care time was exclusive of separately billable procedures and treating other patients.  Critical care was necessary to treat or prevent imminent or life-threatening deterioration.  Critical care was  time spent personally by me on the following activities: development of treatment plan with patient and/or surrogate as well as nursing, discussions with consultants, evaluation of patient's response to treatment, examination of patient, obtaining history from patient or surrogate, ordering and performing treatments and interventions, ordering and review of laboratory studies, ordering and  review of radiographic studies, pulse oximetry, re-evaluation of patient's condition and participation in multidisciplinary rounds.  Kipp Brood, MD [facility] ICU Physician [place]  Pager: [phone] Mobile: [phone] After hours: [phone].

## 2019-06-27 NOTE — Consult Note (Addendum)
[facility] Consult: 3:37 PM 06/27/2019  LOS: 15 days    Referring Provider: Dr Lynetta Mare  Primary Care Physician:  Gaynelle Arabian, MD Primary Gastroenterologist:  Dr. Fuller Plan     Reason for Consultation:  Passing blood   HPI: Steve Martinez is a 79 y.o. male.  PMH DM2. HTN.  GERD.  CKD.  CVA with residual left sided weakness.  Cachectic at baseline. 02/2009 colonoscopy, average risk screening.  Normal study other than internal hemorrhoids 05/21/2019 colonoscopy, screening.  Melanosis coli.  Grade 1 internal hemorrhoids, nonprolapse, nonbleeding..  Patient is plus 2 weeks into admission for COVID-19 pneumonia.  Treated with Decadron, remdesivir, Actemra.  Complications include aspiration event, treated with Unasyn.  Bilateral DVTs, started on heparin.  Worsening AKI, transferred 12/29 for placement of hemodialysis catheter and initiation of CRRT.  06/21/19 noted oozing blood from HD catheter. Suffered PEA arrest 1/4 after transfer to the floor.  Intubated and readmitted back to ICU, CRRT reinitiated.  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 Had altered mental status and CT head of 06/25/2019 showed nonhemorrhagic acute/subacute left PICA territory infarct.  Multiple remote lacunar infarcts.  Generalized atrophy and diffuse white matter disease consistent with chronic microvascular ischemia.  Receiving nutrition via core track feeding tube.  Blood pressure continues to decline with concerns for progressive sepsis. Levophed, Vasopressin in place.  BPs soft for at least 24 hours, readings at 1145 today of 82/50 but now into 140-150/50s.    Heart rate 70s to 90s. Baseline Hgb of 12.6 on 12/23.  Transfused 2 PRBCs 1/4 for Hgb 6.1.  Hgb steadily declining over next few days from 10.5 >> 8.8  today.  1 (3rd PRBC transfuing now)   Platelets 60 K WBCs 32 K. Yeast growing in urine of 1/5. Candida noted on trach aspirate 1/5.     Yesterday staff noted dark stool, 3 PM today passed large red, bloody, malodorous stool.  Day 4 daily IV Protonix.        Past Medical History:  Diagnosis Date  . Back pain    occasionally d/t pulled muscle  . Cataracts, bilateral   . Diabetes mellitus    lantus bid  . GERD (gastroesophageal reflux disease)   . Hemorrhoids   . History of bronchitis    last year  . Hyperlipidemia    takes Zocor daily  . Hypertension    takes Diltiazem,Maxzide,and Quinapril daily  . Myocardial infarction ([place]) 17-[place]yr ago  . [place]    x [place];left side weaker    Past Surgical History:  Procedure Laterality Date  . CARDIAC CATHETERIZATION  17-116yrago  . CATARACT EXTRACTION W/PHACO  02/08/2012   Procedure: CATARACT EXTRACTION PHACO AND INTRAOCULAR LENS PLACEMENT (IOC);  Surgeon: GrAdonis BrookMD;  [facility] Service: Ophthalmology;  Laterality: Right;  . COLONOSCOPY    . IR FLUORO GUIDE CV LINE RIGHT  06/18/2019  . IR [place] VASC ACCESS RIGHT  06/18/2019  . REFRACTIVE SURGERY    . SHOULDER SURGERY Left     Prior  to Admission medications   Medication Sig Start Date End Date Taking? Authorizing Provider  acetaminophen (TYLENOL) 500 MG tablet Take 1,000 mg by mouth every 6 (six) hours as needed for mild pain or fever.   Yes [provider]  amLODipine (NORVASC) 10 MG tablet Take 10 mg by mouth daily.   Yes [provider]  aspirin EC 325 MG tablet Take 325 mg by mouth daily.   Yes [provider]  carvedilol (COREG) 6.25 MG tablet Take 6.25 mg by mouth 2 (two) times daily with a meal.   Yes [provider]  furosemide (LASIX) 40 MG tablet Take 40 mg by mouth daily.   Yes [provider]  NOVOLOG MIX 70/30 FLEXPEN (70-30) 100 UNIT/ML FlexPen Inject 20-30 Units into the skin 2 (two) times daily with a  meal.  04/23/19  Yes [provider]  rosuvastatin (CRESTOR) 40 MG tablet Take 40 mg by mouth daily.   Yes [provider]  ACCU-CHEK AVIVA PLUS test strip  03/18/19   [provider]  BD PEN NEEDLE NANO 2ND GEN 32G X 4 MM MISC  04/09/19   [provider]    Scheduled Meds: . sodium chloride   Intravenous Once  . vitamin C  500 mg Per Tube Daily  . B-complex with vitamin C  1 tablet Per Tube Daily  . chlorhexidine gluconate (MEDLINE KIT)  15 mL Mouth Rinse BID  . Chlorhexidine Gluconate Cloth  6 each Topical q morning - 10a  . darbepoetin (ARANESP) injection - DIALYSIS  60 mcg Intravenous Q Thu-HD  . feeding supplement (PRO-STAT SUGAR FREE 64)  30 mL Per Tube Daily  . insulin aspart  0-9 Units Subcutaneous Q4H  . insulin detemir  10 Units Subcutaneous BID  . mouth rinse  15 mL Mouth Rinse 10 times per day  . pantoprazole (PROTONIX) IV  40 mg Intravenous QHS  . rosuvastatin  40 mg Per Tube Daily  . sodium chloride flush  3 mL Intravenous Q12H  . zinc sulfate  220 mg Per Tube Daily   Infusions: .  prismasol BGK 4/2.5 400 mL/hr at 06/27/19 0941  .  prismasol BGK 4/2.5 400 mL/hr at 06/27/19 0943  . sodium chloride    . sodium chloride    . sodium chloride 10 mL/hr at 06/27/19 0922  . sodium chloride    . albumin human    . ceFEPime (MAXIPIME) IV Stopped (06/27/19 0905)  . feeding supplement (VITAL 1.5 CAL) 55 mL/hr at 06/27/19 0700  . norepinephrine (LEVOPHED) Adult infusion 50 mcg/min (06/27/19 1400)  . prismasol BGK 4/2.5 2,000 mL/hr at 06/27/19 1528  . vancomycin Stopped (06/27/19 1024)  . vasopressin (PITRESSIN) infusion - *FOR SHOCK* 0.03 Units/min (06/27/19 1527)   PRN Meds: sodium chloride, sodium chloride, Place/Maintain arterial line **AND** sodium chloride, albuterol, alteplase, dextrose **OR** dextrose, guaiFENesin-dextromethorphan, heparin, heparin, HYDROmorphone (DILAUDID) injection, lidocaine, lidocaine-prilocaine,  pentafluoroprop-tetrafluoroeth, white petrolatum   Allergies as of 06/11/2019  . (No Known Allergies)    Family History  Problem Relation Age of Onset  . Colon cancer Neg Hx   . Esophageal cancer Neg Hx   . Rectal cancer Neg Hx   . Stomach cancer Neg Hx     Social History   Socioeconomic History  . Marital status: Married    Spouse name: Mechele Claude  . Number of children: 1  . Years of education: Not on file  . Highest education level: Not on file  Occupational History  . Not  on file  Tobacco Use  . Smoking status: Never Smoker  . Smokeless tobacco: Never Used  Substance and Sexual Activity  . Alcohol use: No  . Drug use: No  . Sexual activity: Not Currently  Other Topics Concern  . Not on file  Social History Narrative  . Not on file   Social Determinants of Health   Financial Resource Strain:   . Difficulty of Paying Living Expenses: Not on file  Food Insecurity:   . Worried About Charity fundraiser in the Last Year: Not on file  . Ran Out of Food in the Last Year: Not on file  Transportation Needs:   . Lack of Transportation (Medical): Not on file  . Lack of Transportation (Non-Medical): Not on file  Physical Activity:   . Days of Exercise per Week: Not on file  . Minutes of Exercise per Session: Not on file  Stress:   . Feeling of Stress : Not on file  Social Connections:   . Frequency of Communication with Friends and Family: Not on file  . Frequency of Social Gatherings with Friends and Family: Not on file  . Attends Religious Services: Not on file  . Active Member of Clubs or Organizations: Not on file  . Attends Archivist Meetings: Not on file  . Marital Status: Not on file  Intimate Partner Violence:   . Fear of Current or Ex-Partner: Not on file  . Emotionally Abused: Not on file  . Physically Abused: Not on file  . Sexually Abused: Not on file    REVIEW OF SYSTEMS: Unable to obtain  PHYSICAL EXAM: Vital signs in last 24  hours: Vitals:   06/27/19 1400 06/27/19 1519  BP: (!) 91/47   Pulse: 92 86  Resp: 18 (!) 23  Temp:    SpO2: (!) 87% 91%   Wt Readings from Last 3 Encounters:  06/27/19 72 kg  05/21/19 78 kg  05/07/19 78 kg    Pt not examined.    Intake/Output from previous day: 01/06 0701 - 01/07 0700 In: 2351.4 [I.V.:501.4; NG/GT:1500; IV Piggyback:350] Out: 3165  Intake/Output this shift: Total I/O In: 860.1 [I.V.:195; Other:30; NG/GT:385; IV Piggyback:250.2] Out: 973 [Other:973]  LAB RESULTS: Recent Labs    06/25/19 0351 06/26/19 0333 06/26/19 1405 06/26/19 2202 06/27/19 1212  WBC 33.5* 32.0*  --   --   --   HGB 10.5* 8.9* 9.2* 9.2* 8.8*  HCT 32.3* 26.9* 27.0* 27.0* 26.0*  PLT 103* 60*  --   --   --    BMET Lab Results  Component Value Date   NA 138 06/27/2019   NA 138 06/27/2019   NA 136 06/26/2019   K 4.0 06/27/2019   K 4.3 06/27/2019   K 3.8 06/26/2019   CL 103 06/27/2019   CL 101 06/26/2019   CL 101 06/26/2019   CO2 23 06/27/2019   CO2 20 (L) 06/26/2019   CO2 21 (L) 06/26/2019   GLUCOSE 289 (H) 06/27/2019   GLUCOSE 402 (H) 06/26/2019   GLUCOSE 193 (H) 06/26/2019   BUN 35 (H) 06/27/2019   BUN 38 (H) 06/26/2019   BUN 40 (H) 06/26/2019   CREATININE 1.69 (H) 06/27/2019   CREATININE 1.83 (H) 06/26/2019   CREATININE 2.17 (H) 06/26/2019   CALCIUM 6.7 (L) 06/27/2019   CALCIUM 6.4 (LL) 06/26/2019   CALCIUM 6.5 (L) 06/26/2019   LFT Recent Labs    06/26/19 0333 06/26/19 1729 06/27/19 0417  ALBUMIN 1.5* 1.4* 1.5*  PT/INR Lab Results  Component Value Date   INR 2.6 (H) 06/27/2019   INR 2.9 (H) 06/26/2019   INR 2.5 (H) 06/25/2019     RADIOLOGY STUDIES: VAS US CAROTID  Result Date: 06/27/2019 Summary: Right Carotid: Velocities in the right ICA are consistent with a 1-39% stenosis. Left Carotid: Unable to obtain left sided images due to central line and             bandages. Vertebrals: Right vertebral artery demonstrates antegrade flow. *See table(s)  above for measurements and observations.  Electronically signed by Antony Contras MD on 06/27/2019 at 8:09:47 AM.    Final    ECHOCARDIOGRAM LIMITED  Result Date: 06/26/2019 IMPRESSIONS  1. Technically difficult study, very limited images  2. Left ventricular ejection fraction, by visual estimation, is 65 to 70%. The left ventricle has hyperdynamic function.  3. Global right ventricle has grossly normal size and systolic function. FINDINGS  Left Ventricle: Left ventricular ejection fraction, by visual estimation, is 65 to 70%. The left ventricle has hyperdynamic function. Right Ventricle: The right ventricular size is normal. Global RV systolic function is has normal systolic function. The tricuspid regurgitant velocity is 2.47 m/s, and with an assumed right atrial pressure of 8 mmHg, the estimated right ventricular systolic pressure is mildly elevated at 32.4 mmHg. Mitral Valve: MV Area by PHT, 2.00 cm. MV PHT, 110.20 msec. No evidence of mitral valve regurgitation. Tricuspid Valve: Tricuspid valve regurgitation is trivial. Aortic Valve: The aortic valve is abnormal. . There is moderate thickening and moderate calcification of the aortic valve. There is moderate thickening of the aortic valve. There is moderate calcification of the aortic valve.   Diastology          Electronically signed by Oswaldo Milian MD Signature Date/Time: 06/26/2019/4:34:06 PM    Final      IMPRESSION:    *   Covid 19 PNA, possible superimposed aspiration PNA Completed Decadron, Actemra, Unasyn.  Vancomycin remains in place.      *  GI bleed: hematochezia today described as large, malodorous per RN.   Colonoscopy 5 weeks ago showed melanosis and small, non-bleeding hemorrhoids. Day 4 IV Protonix.   Volume of blood seems beyond that normally seen w hemorrhoidal source.   ? Rapid UGI bleed. (no EGD in chart)   *   Hypoalbuminemia.  Severe protein calorie malnutrition.  On tube feedings.  Vital 1.5 cal @ 55/hour.    *    AKI. 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    *   bil DVT.  Last Coumadin was on 1/3, discontinued after that.    *   06/24/19 PEA arrest.  Placed on vent then  *   AMS.  Hx CVA.  CVA per head CT.  MRI supposedly ordered but not yet done and no MRI order in place.   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     *   Anemia.  On Aranesp.  Receiving his 3rd PRBC thus far.    PLAN:     *   Will increase Protonix to 40 IV bid.  Serial CBC.  Would not stop tube feeds as his poor nutritional status will worsen w/o feeding and no plans to scope immediately.     Azucena Freed  06/27/2019, 3:37 PM Phone [phone]  GI ATTENDING  History, laboratories, x-rays, recent colonoscopy report reviewed.  Patient not seen and examined (active Covid) yet.  Agree with comprehensive consultation note as outlined above.  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  Complete colonoscopy recently reveals no other abnormalities.  No rise in BUN (actually lower) mitigating against upper GI bleed, though at risk for ulcers.  Thus, I agree with increasing IV PPI therapy.  Supportive care additionally including blood transfusions as needed.  We will follow.  Docia Chuck. Geri Seminole., M.D. [facility] Division of Gastroenterology

## 2019-06-28 ENCOUNTER — Inpatient Hospital Stay (HOSPITAL_COMMUNITY): Payer: Medicare HMO

## 2019-06-28 DIAGNOSIS — U071 COVID-19: Principal | ICD-10-CM

## 2019-06-28 DIAGNOSIS — G9341 Metabolic encephalopathy: Secondary | ICD-10-CM

## 2019-06-28 DIAGNOSIS — K921 Melena: Secondary | ICD-10-CM

## 2019-06-28 DIAGNOSIS — J9621 Acute and chronic respiratory failure with hypoxia: Secondary | ICD-10-CM

## 2019-06-28 LAB — CBC WITH DIFFERENTIAL/PLATELET
Abs Immature Granulocytes: 0.54 10*3/uL — ABNORMAL HIGH (ref 0.00–0.07)
Basophils Absolute: 0.2 10*3/uL — ABNORMAL HIGH (ref 0.0–0.1)
Basophils Relative: 1 %
Eosinophils Absolute: 0.2 10*3/uL (ref 0.0–0.5)
Eosinophils Relative: 1 %
HCT: 28.5 % — ABNORMAL LOW (ref 39.0–52.0)
Hemoglobin: 9.4 g/dL — ABNORMAL LOW (ref 13.0–17.0)
Immature Granulocytes: 2 %
Lymphocytes Relative: 4 %
Lymphs Abs: 1.2 10*3/uL (ref 0.7–4.0)
MCH: 27.3 pg (ref 26.0–34.0)
MCHC: 33 g/dL (ref 30.0–36.0)
MCV: 82.8 fL (ref 80.0–100.0)
Monocytes Absolute: 0.9 10*3/uL (ref 0.1–1.0)
Monocytes Relative: 3 %
Neutro Abs: 28.1 10*3/uL — ABNORMAL HIGH (ref 1.7–7.7)
Neutrophils Relative %: 89 %
Platelets: 33 10*3/uL — ABNORMAL LOW (ref 150–400)
RBC: 3.44 MIL/uL — ABNORMAL LOW (ref 4.22–5.81)
RDW: 16.3 % — ABNORMAL HIGH (ref 11.5–15.5)
WBC Morphology: INCREASED
WBC: 31.1 10*3/uL — ABNORMAL HIGH (ref 4.0–10.5)
nRBC: 2.5 % — ABNORMAL HIGH (ref 0.0–0.2)

## 2019-06-28 LAB — TYPE AND SCREEN
ABO/RH(D): O POS
Antibody Screen: NEGATIVE
Unit division: 0
Unit division: 0
Unit division: 0
Unit division: 0

## 2019-06-28 LAB — BPAM RBC
Blood Product Expiration Date: 202101312359
Blood Product Expiration Date: 202102012359
Blood Product Expiration Date: 202102032359
Blood Product Expiration Date: 202102032359
ISSUE DATE / TIME: 202101041509
ISSUE DATE / TIME: 202101041738
ISSUE DATE / TIME: 202101071530
ISSUE DATE / TIME: 202101080127
Unit Type and Rh: 5100
Unit Type and Rh: 5100
Unit Type and Rh: 5100
Unit Type and Rh: 5100

## 2019-06-28 LAB — GLUCOSE, CAPILLARY
Glucose-Capillary: 110 mg/dL — ABNORMAL HIGH (ref 70–99)
Glucose-Capillary: 139 mg/dL — ABNORMAL HIGH (ref 70–99)
Glucose-Capillary: 191 mg/dL — ABNORMAL HIGH (ref 70–99)
Glucose-Capillary: 71 mg/dL (ref 70–99)
Glucose-Capillary: 76 mg/dL (ref 70–99)
Glucose-Capillary: 76 mg/dL (ref 70–99)

## 2019-06-28 LAB — POCT I-STAT 7, (LYTES, BLD GAS, ICA,H+H)
Acid-base deficit: 4 mmol/L — ABNORMAL HIGH (ref 0.0–2.0)
Bicarbonate: 24.1 mmol/L (ref 20.0–28.0)
Calcium, Ion: 1.05 mmol/L — ABNORMAL LOW (ref 1.15–1.40)
HCT: 31 % — ABNORMAL LOW (ref 39.0–52.0)
Hemoglobin: 10.5 g/dL — ABNORMAL LOW (ref 13.0–17.0)
O2 Saturation: 82 %
Patient temperature: 96.1
Potassium: 4.1 mmol/L (ref 3.5–5.1)
Sodium: 139 mmol/L (ref 135–145)
TCO2: 26 mmol/L (ref 22–32)
pCO2 arterial: 53.8 mmHg — ABNORMAL HIGH (ref 32.0–48.0)
pH, Arterial: 7.252 — ABNORMAL LOW (ref 7.350–7.450)
pO2, Arterial: 50 mmHg — ABNORMAL LOW (ref 83.0–108.0)

## 2019-06-28 LAB — RENAL FUNCTION PANEL
Albumin: 1.8 g/dL — ABNORMAL LOW (ref 3.5–5.0)
Anion gap: 10 (ref 5–15)
BUN: 22 mg/dL (ref 8–23)
CO2: 26 mmol/L (ref 22–32)
Calcium: 6.6 mg/dL — ABNORMAL LOW (ref 8.9–10.3)
Chloride: 104 mmol/L (ref 98–111)
Creatinine, Ser: 1.29 mg/dL — ABNORMAL HIGH (ref 0.61–1.24)
GFR calc Af Amer: >60 mL/min (ref >60)
GFR calc non Af Amer: 53 mL/min — ABNORMAL LOW (ref >60)
Glucose, Bld: 109 mg/dL — ABNORMAL HIGH (ref 70–99)
Phosphorus: 3 mg/dL (ref 2.5–4.6)
Potassium: 4.1 mmol/L (ref 3.5–5.1)
Sodium: 140 mmol/L (ref 135–145)

## 2019-06-28 LAB — PROTIME-INR
INR: 1.7 — ABNORMAL HIGH (ref 0.8–1.2)
Prothrombin Time: 19.8 seconds — ABNORMAL HIGH (ref 11.4–15.2)

## 2019-06-28 LAB — MAGNESIUM: Magnesium: 2.4 mg/dL (ref 1.7–2.4)

## 2019-06-28 LAB — LACTIC ACID, PLASMA
Lactic Acid, Venous: 3.4 mmol/L (ref 0.5–1.9)
Lactic Acid, Venous: 2.6 mmol/L (ref 0.5–1.9)

## 2019-06-28 MED ORDER — SODIUM CHLORIDE 0.9 % IV SOLN
100.0000 mg | INTRAVENOUS | Status: DC
  Administered 2019-06-29 – 2019-07-01 (×3): 100 mg via INTRAVENOUS
  Filled 2019-06-28 (×4): qty 100

## 2019-06-28 MED ORDER — SODIUM CHLORIDE 0.9 % IV SOLN
200.0000 mg | Freq: Once | INTRAVENOUS | Status: AC
  Administered 2019-06-28: 200 mg via INTRAVENOUS
  Filled 2019-06-28: qty 200

## 2019-06-28 MED ORDER — SODIUM CHLORIDE 0.9 % IV BOLUS
1000.0000 mL | Freq: Once | INTRAVENOUS | Status: AC
  Administered 2019-06-28: 1000 mL via INTRAVENOUS

## 2019-06-28 MED ORDER — HYDROCORTISONE NA SUCCINATE PF 100 MG IJ SOLR
50.0000 mg | Freq: Four times a day (QID) | INTRAMUSCULAR | Status: DC
  Administered 2019-06-28 – 2019-07-01 (×15): 50 mg via INTRAVENOUS
  Filled 2019-06-28 (×15): qty 2

## 2019-06-28 MED ORDER — STERILE WATER FOR INJECTION IV SOLN
INTRAVENOUS | Status: DC
  Filled 2019-06-28 (×9): qty 850

## 2019-06-28 MED ORDER — ALBUMIN HUMAN 25 % IV SOLN
25.0000 g | Freq: Once | INTRAVENOUS | Status: AC
  Administered 2019-06-28: 25 g via INTRAVENOUS
  Filled 2019-06-28: qty 100

## 2019-06-28 MED ORDER — SODIUM CHLORIDE 0.9 % IV BOLUS
500.0000 mL | Freq: Once | INTRAVENOUS | Status: AC
  Administered 2019-06-28: 500 mL via INTRAVENOUS

## 2019-06-28 MED ORDER — SODIUM BICARBONATE 8.4 % IV SOLN
INTRAVENOUS | Status: AC
  Filled 2019-06-28: qty 100

## 2019-06-28 MED ORDER — SODIUM BICARBONATE 8.4 % IV SOLN
100.0000 meq | Freq: Once | INTRAVENOUS | Status: AC
  Administered 2019-06-28: 100 meq via INTRAVENOUS

## 2019-06-28 NOTE — Progress Notes (Signed)
[place] Progress Note Patient Name: Steve Martinez DOB: 1940/12/19 MRN: [id number]   Date of Service  06/28/2019  HPI/Events of Note  Hypoxia - My review of portable CXR reveals no evidence of pneumothorax.   eICU Interventions  Will order: 1. Increase PEEP to 10.      Intervention Category Major Interventions: Hypoxemia - evaluation and management;Respiratory failure - evaluation and management  Cohl Behrens Eugene 06/28/2019, 2:06 AM

## 2019-06-28 NOTE — Plan of Care (Signed)
  Problem: Respiratory: Goal: Will maintain a patent airway Outcome: Progressing   Problem: Elimination: Goal: Will not experience complications related to bowel motility Outcome: Progressing Note: Last BM 06/28/2019    Problem: Skin Integrity: Goal: Risk for impaired skin integrity will decrease Outcome: Progressing   Problem: Clinical Measurements: Goal: Respiratory complications will improve Outcome: Not Progressing Note: Pt required increased oxygen needs on ventilator overnight. RT has decreased these requirements this morning. Will keeping monitoring patients response.   Problem: Nutrition: Goal: Adequate nutrition will be maintained Outcome: Not Progressing Note: Cortrak not in place overnight and therefore taken out. Per MD, once discussion is had with family about goals of care, we will decide if Cortrak needs to be replaced

## 2019-06-28 NOTE — Progress Notes (Signed)
Reason for consult: Neurological prognostication, stroke  Subjective:   Patient's course further complicated due to concern for sepsis as well as blood in stool with suspicion for ischemic bowel.  MRI brain was canceled as patient was too unstable.   ROS:  Unable to obtain due to poor mental status  Examination  Vital signs in last 24 hours: Temp:  [95 F (35 C)-96.5 F (35.8 C)] 96.5 F (35.8 C) (01/08 1600) Pulse Rate:  [69-90] (P) 82 (01/08 1951) Resp:  [16-34] (P) 31 (01/08 1951) BP: (87-135)/(31-62) (P) 120/35 (01/08 1951) SpO2:  [79 %-100 %] 95 % (01/08 1951) Arterial Line BP: (88-157)/(30-42) 126/35 (01/08 1900) FiO2 (%):  [70 %-100 %] (P) 70 % (01/08 1951) Weight:  [71 kg] 71 kg (01/08 0500)  General: lying in bed CVS: pulse-normal rate and rhythm RS: breathing with assistance of ventilator Extremities: normal   Neuro: Mental status: Opens eyes, does not track examiner following commands.  Does grimace mildly to pain. Cranial nerves: Pupils are pinpoint,  gag reflex present Motor: Withdraws to stimulus, RUE > RLE, does not withdraw in bilateral lower extremities Plantars mute  Basic Metabolic Panel: Recent Labs  Lab 06/22/19 0803 06/23/19 0656 06/25/19 0351 06/26/19 0333 06/26/19 1729 06/27/19 0417 06/27/19 1510 06/27/19 2000 06/28/19 0119 06/28/19 0426 06/28/19 1514  NA 138  --  138 134* 133* 138 136 139 139 140 137  K 5.7*  --  5.3* 4.2 4.0 4.3 4.0 4.1 4.1 4.1 4.0  CL 104  --  105 101 101 103 105  --   --  104 104  CO2 22  --  22 21* 20* 23 22  --   --  26 31  GLUCOSE 336*  --  120* 193* 402* 289* 172*  --   --  109* 81  BUN 80*  --  54* 40* 38* 35* 27*  --   --  22 18  CREATININE 4.43*  --  3.21* 2.17* 1.83* 1.69* 1.52*  --   --  1.29* 1.24  CALCIUM 6.5*  --  6.8* 6.5* 6.4* 6.7* 6.5*  --   --  6.6* 6.5*  MG 3.2*  --  2.6* 2.4  --  2.7*  --   --   --  2.4  --   PHOS  --    < > 6.7* 4.1 3.5 2.9 3.2  --   --  3.0 2.1*   < > = values in this interval  not displayed.    CBC: Recent Labs  Lab 06/23/19 0656 06/24/19 0344 06/25/19 0351 06/26/19 0333 06/27/19 1212 06/27/19 1523 06/27/19 2000 06/28/19 0119 06/28/19 0426  WBC 12.1* 8.0 33.5* 32.0*  --  40.2*  --   --  31.1*  NEUTROABS 10.5* 6.9 30.7* 29.6*  --   --   --   --  28.1*  HGB 8.6* 7.8* 10.5* 8.9* 8.8* 8.6* 10.5* 10.5* 9.4*  HCT 27.3* 24.3* 32.3* 26.9* 26.0* 26.3* 31.0* 31.0* 28.5*  MCV 83.2 82.1 83.2 82.8  --  85.7  --   --  82.8  PLT 137* 93* 103* 60*  --  65*  --   --  33*     Coagulation Studies: Recent Labs    06/26/19 0333 06/27/19 0417 06/28/19 0426  LABPROT 30.7* 28.0* 19.8*  INR 2.9* 2.6* 1.7*    Imaging Reviewed:     ASSESSMENT AND PLAN  79 year old male with subacute infarct in the PICA territory, acute on chronic injury requiring CRRT,  bilateral DVTs, sepsis and ischemic bowel.   Unclear if mental status is due stroke versus notable metabolic conditions including COVID-19 encephalopathy, uremia and sepsis vs anoxic brain injury.  It appears MRI brain was canceled as patient is now too unstable to obtain study.    Recommendation:  When patient is stable, please perform MRI brain ( and MRA head without contrast)  to assess extent of stroke as well as to look for anoxic injury. MRI brain will assist in neurological prognostication.  Consider completion of stroke work-up ( as documented on Consult by Dr. Hortense Ramal on 06/25/19) as well as treatment when patient is medically stable, currently patient is thrombocytopenic and has bleeding and therefore agree with holding antiplatelets.  Neurology will be available as needed.  Please call back if you have any questions.    Karena Addison Mikayla Chiusano [facility] Pager Number [phone] For questions after 7pm please refer to AMION to reach the Neurologist on call

## 2019-06-28 NOTE — Progress Notes (Signed)
[place] Progress Note Patient Name: Steve Martinez DOB: September 12, 1940 MRN: [id number]   Date of Service  06/28/2019  HPI/Events of Note  Hypoxia and Hypotension - Last ABG on 100%/PRVC 30/TV 470/P 10 = 7.52/53.8/50.0  eICU Interventions  Will order: 1. Bolus with 0.9 NaCl 1 liter IV over 1 hour now. 2. Increase ceiling on Norepinephrine IV infusion to 70 mcg/min. 3. Measure CVP now and Q 4 hours.  4. NaHCO3 100 meq IV now.  5. NaHCO3 IV infusion at 100 mL/hour. 6. Repeat ABG at 5 AM.  7. Will ask ground team to evaluate at bedside. He appears to be entering a phase of rapid deterioration. [place] discussion may be appropriate.      Intervention Category Major Interventions: Hypoxemia - evaluation and management;Hypotension - evaluation and management  Lysle Dingwall 06/28/2019, 3:39 AM

## 2019-06-28 NOTE — Progress Notes (Signed)
NAME:  Steve Martinez, MRN:  [id number], DOB:  April 30, 1941, LOS: 72 ADMISSION DATE:  06/05/2019, CONSULTATION DATE:  06/18/2019 REFERRING MD:  Dr. Candiss Norse, CHIEF COMPLAINT:  Hypotension  Brief History   79 yo male presented with weakness, dyspnea, hypoxia from COVID 19 pneumonia.  Course complicated by aspiration pneumonia, b/l leg DVT, and AKI.  Transferred to [facility] for iHD.  Developed septic shock and transferred to ICU.  Past Medical History  HTN, HLD, DMT2, GERD, CVA with left sided deficits/weakness, MI, CKD  Significant Hospital Events   12/23 Admitted [facility] 12/29 transfer [facility]/ TDC placement 12/30 did not tolerated HD (became unresponsive), started on CRRT, tocilizumab x 1 01/02 Transfer out of ICU 01/04 PEA arrest on floor post HD, intubated and transferred back to ICU 01/05 concern for sepsis >> restart ABx 01/07 Blood in stool  Consults:  Nephrology 12/28 Neurology 1/05 Gastroenterology 1/07   Procedures:  12/29 Right TDC >> 1/4 ETT >  1/4  Lt IJ CVL >> 1/4 Rt radial A line >>   Significant Diagnostic Tests:  12/24 DVT BLE [place] >> acute DVT Rt peroneal vein; SVT Lt small saphenous vein 12/26 renal US >> chronic medical renal disease 12/27 TTE >> EF greater than 75% 1/5 CT head >>  Non hemorrhagic Lt PICA infarct acute/subacute, multiple remote lacunar infarcts in BS b/l, atrophy, chronic microvascular ischemia 1/5 EEG >> continuous rhythmic slowing 1/6 Echo >> EF 79 yo 70%  Micro Data:  12/23 SARS Coronavirus 2 POSITIVE 12/23 Blood Negative 12/23 Urine multiple species 1/5 respiratory >> Candida albicans 1/5 blood cultures >>   Antimicrobials:  Amp/Sulbactam 12/27>> Ceftriaxone 12/23 x 1 dose Doxycycline 12/23 x 1 dose Remdesivir 12/23>>12/27 Decadron 12/23>> x10 days Tocilizumab 12/30  X 1 Vancomycin 1/05 >> Cefepime 1/05 >>   Interim history/subjective:  Increasing pressor needs overnight.  Objective   Blood pressure (!) 118/47, pulse 80, temperature (!)  96.1 F (35.6 C), temperature source Rectal, resp. rate (!) 27, height 6\' 1"  (1.854 m), weight 71 kg, SpO2 96 %. CVP:  [1 mmHg-5 mmHg] 5 mmHg  Vent Mode: PRVC FiO2 (%):  [80 %-100 %] 80 % Set Rate:  [30 bmp] 30 bmp Vt Set:  [450 mL-470 mL] 450 mL PEEP:  [7 cmH20-10 cmH20] 10 cmH20 Delta P (Amplitude):  [100] 100 Plateau Pressure:  [26 cmH20-30 cmH20] 27 cmH20   Intake/Output Summary (Last 24 hours) at 06/28/2019 1205 Last data filed at 06/28/2019 1200 Gross per 24 hour  Intake 4308.55 ml  Output 2242 ml  Net 2066.55 ml   Filed Weights   06/26/19 0409 06/27/19 0413 06/28/19 0500  Weight: 71.5 kg 72 kg 71 kg   Physical Exam:  General - unresponsive Eyes - pupils pinpoint ENT - ETT in place Cardiac - regular rate/rhythm, no murmur Chest - b/l crackles Abdomen - soft, non tender, decreased bowel sounds Extremities - no edema Skin - no rashes Neuro - not following commands  CXR (reviewed by me) - 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 - goal SpO2 88 to 95% - full vent support  Septic shock. - pressors to keep MAP > 65 - stress dose steroids - continue vancomycin, cefepime, eraxis  AKI from ATN. Metabolic acidosis. - CRRT  Acute metabolic encephalopathy with acute CVA after PEA arrest with hx of CVA. - not on sedation currently  PEA cardiac arrest from hypoxia. Hx of CAD, HTN, HLD. -  monitor hemodynamics  B/l DVT. - anticoagulation held in setting of GI bleed  GI bleeding. - concern for ischemic colitis and gastritis - continue protonix  Thrombocytopenia in setting of sepsis. - f/u CBC  DM type II. - SSI with levemir  Goals of care. - prognosis for meaningful recovery seems poor - will need to have further discussions with family >> tried calling pt's son, but no response  Best practice:  Nutrition: NPO DVT prophylaxis: SCDs SUP: Protonix Mobility: bed rest Goals of care:  full code Disposition: ICU  Labs:   CMP Latest Ref Rng & Units 06/28/2019 06/28/2019 06/27/2019  Glucose 70 - 99 mg/dL 109(H) - -  BUN 8 - 23 mg/dL 22 - -  Creatinine 0.61 - 1.24 mg/dL 1.29(H) - -  Sodium 135 - 145 mmol/L 140 139 139  Potassium 3.5 - 5.1 mmol/L 4.1 4.1 4.1  Chloride 98 - 111 mmol/L 104 - -  CO2 22 - 32 mmol/L 26 - -  Calcium 8.9 - 10.3 mg/dL 6.6(L) - -  Total Protein 6.5 - 8.1 g/dL - - -  Total Bilirubin 0.3 - 1.2 mg/dL - - -  Alkaline Phos 38 - 126 U/L - - -  AST 15 - 41 U/L - - -  ALT 0 - 44 U/L - - -    CBC Latest Ref Rng & Units 06/28/2019 06/28/2019 06/27/2019  WBC 4.0 - 10.5 K/uL 31.1(H) - -  Hemoglobin 13.0 - 17.0 g/dL 9.4(L) 10.5(L) 10.5(L)  Hematocrit 39.0 - 52.0 % 28.5(L) 31.0(L) 31.0(L)  Platelets 150 - 400 K/uL 33(L) - -    ABG    Component Value Date/Time   PHART 7.252 (L) 06/28/2019 0119   PCO2ART 53.8 (H) 06/28/2019 0119   PO2ART 50.0 (L) 06/28/2019 0119   HCO3 24.1 06/28/2019 0119   TCO2 26 06/28/2019 0119   ACIDBASEDEF 4.0 (H) 06/28/2019 0119   O2SAT 82.0 06/28/2019 0119    CBG (last 3)  Recent Labs    06/28/19 0414 06/28/19 0803 06/28/19 1150  GLUCAP 110* 76 71    CC time 39 minutes  Chesley Mires, MD [place] 06/28/2019, 12:18 PM

## 2019-06-28 NOTE — Progress Notes (Signed)
Nephrology MD on call for this evening, Dr. Royce Macadamia, paged. MD made aware of frequent clotting of filter today. MD said not to initiate anticoagulation at this time, but did order for pre blood rate to be increased to 500cc/hr. RN will make this change and continue to monitor.

## 2019-06-28 NOTE — Progress Notes (Signed)
Patient has been keeping all intake for past 14 hrs.  cvp has improved to 12.  sats and pressure all good.  Attempted to keep fluid I and O even by pulling only intake with crrt.  After 1.5 hrs, pressure dropped significantly requiring more norepi support, and sp02 dropped into low 80's from high 90's.  Decided to put pull rate back to zero, soon after was able to come back down on norepi.  Unable to pull any fluid at thid time.  Will continue to closely monitor and perform necessary interventions when appropriate

## 2019-06-28 NOTE — Progress Notes (Signed)
Pt has not had any loose stools today. Per Dr. Halford Chessman pt can come off of enteric precautions.

## 2019-06-28 NOTE — Procedures (Signed)
Cortrak  Person Inserting Tube:  Valera Vallas, RD Tube Type:  Cortrak - 43 inches Tube Location:  Right nare Initial Placement:  Stomach Secured by: Bridle Technique Used to Measure Tube Placement:  Documented cm marking at nare/ corner of mouth Cortrak Secured At:  70 cm   No x-ray is required. RN may begin using tube.   If the tube becomes dislodged please keep the tube and contact the Cortrak team at [email or web address] ([email or web address]) for replacement.  If after hours and replacement cannot be delayed, place a NG tube and confirm placement with an abdominal x-ray.    Kynan Peasley RD, LDN Clinical Nutrition Pager # - [phone]    

## 2019-06-28 NOTE — Progress Notes (Addendum)
Daily Rounding Note  06/28/2019, 9:36 AM  LOS: 16 days   SUBJECTIVE:   Chief complaint: Hematochezia, bleeding per rectum. Stool later yesterday was dark with some brown as well.  No further hematochezia. Remains intubated, on Levophed, vasopressin.    OBJECTIVE:         Vital signs in last 24 hours:    Temp:  [94.2 F (34.6 C)-97.1 F (36.2 C)] 96.1 F (35.6 C) (01/07 2322) Pulse Rate:  [72-95] 75 (01/08 0900) Resp:  [16-34] 28 (01/08 0900) BP: (72-151)/(31-92) 110/51 (01/08 0900) SpO2:  [71 %-100 %] 97 % (01/08 0900) Arterial Line BP: (60-168)/(29-51) 134/40 (01/08 0900) FiO2 (%):  [80 %-100 %] 80 % (01/08 0833) Weight:  [71 kg] 71 kg (01/08 0500) Last BM Date: 06/28/19 Filed Weights   06/26/19 0409 06/27/19 0413 06/28/19 0500  Weight: 71.5 kg 72 kg 71 kg   Not examined.  Intake/Output from previous day: 01/07 0701 - 01/08 0700 In: 4021 [I.V.:1209.8; Blood:335; NG/GT:660; IV Piggyback:1726.2] Out: 2943   Intake/Output this shift: Total I/O In: 343.3 [I.V.:231.5; IV Piggyback:111.8] Out: 1 [Other:1]  Lab Results: Recent Labs    06/26/19 0333 06/27/19 1523 06/27/19 2000 06/28/19 0119 06/28/19 0426  WBC 32.0* 40.2*  --   --  31.1*  HGB 8.9* 8.6* 10.5* 10.5* 9.4*  HCT 26.9* 26.3* 31.0* 31.0* 28.5*  PLT 60* 65*  --   --  33*   BMET Recent Labs    06/27/19 0417 06/27/19 1510 06/27/19 2000 06/28/19 0119 06/28/19 0426  NA 138 136 139 139 140  K 4.3 4.0 4.1 4.1 4.1  CL 103 105  --   --  104  CO2 23 22  --   --  26  GLUCOSE 289* 172*  --   --  109*  BUN 35* 27*  --   --  22  CREATININE 1.69* 1.52*  --   --  1.29*  CALCIUM 6.7* 6.5*  --   --  6.6*   LFT Recent Labs    06/27/19 0417 06/27/19 1510 06/28/19 0426  ALBUMIN 1.5* 1.5* 1.8*   PT/INR Recent Labs    06/27/19 0417 06/28/19 0426  LABPROT 28.0* 19.8*  INR 2.6* 1.7*   Hepatitis Panel No results for input(s): HEPBSAG, HCVAB,  HEPAIGM, HEPBIGM in the last 72 hours.  Studies/Results: DG CHEST PORT 1 VIEW  Result Date: 06/28/2019 CLINICAL DATA:  COVID 19 infection EXAM: PORTABLE CHEST 1 VIEW COMPARISON:  06/25/2019 FINDINGS: Endotracheal tube tip has been advanced slightly and is at the level of the clavicular heads. The right IJ approach central venous catheter is in unchanged position. There is slightly improved aeration at the left lung base with mild residual opacity. Opacities in the upper right lung are unchanged. IMPRESSION: Slightly improved aeration of the left lung. Otherwise unchanged multifocal infection. Endotracheal tube tip at the level of the clavicular heads. Electronically Signed   By: Ulyses Jarred M.D.   On: 06/28/2019 02:22   VAS US CAROTID  Result Date: 06/27/2019 Carotid Arterial Duplex Study Indications:       CVA. Other Factors:     COVID 19 positive. Limitations        Today's exam was limited due to a central line. Comparison Study:  No prior studies. Performing Technologist: Oliver Hum RVT  Examination Guidelines: A complete evaluation includes B-mode imaging, spectral Doppler, color Doppler, and power Doppler as needed of all accessible portions of each vessel.  Bilateral testing is considered an integral part of a complete examination. Limited examinations for reoccurring indications may be performed as noted.  Right Carotid Summary: Right Carotid: Velocities in the right ICA are consistent with a 1-39% stenosis. Left Carotid: Unable to obtain left sided images due to central line and             bandages. Vertebrals: Right vertebral artery demonstrates antegrade flow. *See table(s) above for measurements and observations.  Electronically signed by Antony Contras MD on 06/27/2019 at 8:09:47 AM.    Final    ECHOCARDIOGRAM LIMITED  Result Date: 06/26/2019   ECHOCARDIOGRAM LIMITED REPORT   Patient Name:   Steve Martinez Date of Exam: 06/26/2019 Medical Rec #:  [id number]   Height:       73.0 in Accession #:     [id number]  Weight:       157.6 lb Date of Birth:  1940-08-19   BSA:          1.94 m Patient Age:    26 years    BP:           124/50 mmHg Patient Gender: M           HR:           82 bpm. Exam Location:  Inpatient  Procedure: Limited Echo, Cardiac Doppler and Limited Color Doppler Indications:    Stroke 434.91 / I163.9  History:        Patient has prior history of Echocardiogram examinations, most                 recent 06/16/2019. COVID 19. GERD. Acute hypoxic respiratory                 failure. 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  Sonographer:    Darlina Sicilian RDCS Referring Phys: [id number] Lemay  Sonographer Comments: Technically difficult study due to poor echo windows, suboptimal parasternal window, suboptimal apical window and echo performed with patient supine and on artificial respirator. Image acquisition challenging due to respiratory motion. IMPRESSIONS  1. Technically difficult study, very limited images  2. Left ventricular ejection fraction, by visual estimation, is 65 to 70%. The left ventricle has hyperdynamic function.  3. Global right ventricle has grossly normal size and systolic function. FINDINGS  Left Ventricle: Left ventricular ejection fraction, by visual estimation, is 65 to 70%. The left ventricle has hyperdynamic function. Right Ventricle: The right ventricular size is normal. Global RV systolic function is has normal systolic function. The tricuspid regurgitant velocity is 2.47 m/s, and with an assumed right atrial pressure of 8 mmHg, the estimated right ventricular systolic pressure is mildly elevated at 32.4 mmHg. Mitral Valve: MV Area by PHT, 2.00 cm. MV PHT, 110.20 msec. No evidence of mitral valve regurgitation. Tricuspid Valve: Tricuspid valve regurgitation is trivial. Aortic Valve: The aortic valve is abnormal. . There is moderate thickening and moderate calcification of the aortic valve. There is moderate thickening of the aortic valve. There is moderate calcification of  the aortic valve.   Diastology          Electronically signed by Oswaldo Milian MD Signature Date/Time: 06/26/2019/4:34:06 PM    Final     ASSESMENT:    *   Covid 19 PNA, possible superimposed aspiration PNA Completed Decadron, Actemra, Unasyn.  Vancomycin remains in place.      *  GI bleed: hematochezia 1/7.   Colonoscopy 5 weeks ago showed melanosis and small, non-bleeding hemorrhoids. Day 5  IV Protonix.   Suspect intestinal/colonic ischemia following PEA arrest.   Known hemorrhoids but volume of blood beyond that normally seen w hemorrhoidal source.   No recurrence hematochezia but some dark stools  *   Anemia.  On Aranesp.  Receiving his 3rd PRBC thus far.  Hgb 8.6 >> 1 PRBC >> 10.5 >> 9.4.  Transfused 3 PRBCs over entire admission.    *   Coagulopathy.  Improved. No Vitamin K or FFP administered thus far.  .    *   Thrombocytopenia.  Platelets 65 >> 33 K.    *   Severe protein calorie malnutrition.  On Vital 1.5 cal @ 55/hour.    *   AKI. 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    *   bil DVT.  Last Coumadin was on 1/3, discontinued after that.    *   06/24/19 PEA arrest.  Placed on vent then.  *   AMS.  Hx CVA.  CVA per head CT.  MRI supposedly ordered but not yet done and no MRI order in place.   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       PLAN   *   CCM has had d/w son Dyon re code status.  Family needs to discuss amongst themselves.  For now full code.    *  No  plans for EGD or colonoscopy.      Azucena Freed  06/28/2019, 9:36 AM Phone [phone]  GI ATTENDING  Interval history data reviewed.  Agree with interval progress note as outlined above.  Less bleeding, possibly stopped.  Hemoglobin stable.  Suspect ischemic colitis related to recent episode of shock.  The treatment is fully supportive.  Has had recent colonoscopy with no other significant pathology noted.  Continue with maximal supportive care.  Transfuse as needed.  No further plans from GI perspective.  However, please  call if you have questions or new problems.  We will sign off.  Docia Chuck. Geri Seminole., M.D. [facility] Division of Gastroenterology

## 2019-06-28 NOTE — Progress Notes (Signed)
[facility] Progress Note Patient Name: Steve Martinez DOB: 1941/05/23 MRN: [id number]   Date of Service  06/28/2019  HPI/Events of Note  Hypoxia - Sats go down to low to mid 80's.  eICU Interventions  Will order: 1. Portable CXR STAT.     Intervention Category Major Interventions: Hypoxemia - evaluation and management  Julisa Flippo Eugene 06/28/2019, 1:27 AM

## 2019-06-28 NOTE — Progress Notes (Addendum)
PCCM Interval Progress Note  Called to bedside for worsening hypotension and hypoxia. Currently on 40 levo, 0.03 vaso.  MAP 53.  2amps HCO3 given by Warren Lacy. No significant active bleeding noted.  ABG 7.29 / 55 / O2 not detected.  Discussed with Dr. Francie Massing.  250cc NS bolus ordered. Stress dose steroids added. Eraxis added as last ditch effort given concern ischemic colitis and refractory shock.   Called pt's son Tralyn over the phone.  Discussed events of the evening and recapped major events of entire hospitalization.  Informed him of tonights decline and that pt is likely actively dying.  Recommended DNR / DNI with continued supportive care / medical therapies for now until he can discuss with his mother further (hopefully early AM). [facility] not ready to make decision without reaching out to mother.  He stated that he will do so now and hopes to have an answer for [place] within the next hour. He will call the unit and inform pt's RN of their decision.  RN updated on plan of care and told to expect call from son.   Montey Hora, [facility] 06/28/2019, 4:34 AM

## 2019-06-28 NOTE — Progress Notes (Signed)
Pharmacy Antibiotic Note  Steve Martinez is a 79 y.o. male admitted on 05/24/2019 with infection of unknown source. Previously completed Unasyn course for aspiration PNA on 1/2. Pharmacy has been consulted for vancomycin/cefepime dosing. Patient was placed back on CRRT on 1/4 after PEA arrest due to hypotension during IHD.  Patient continues on CRRT  Plan: Continue Cefepime 2g IV q12h Continue Vancomycin 750 mg IV Q 24 hrs Monitor clinical progress, c/s, vancomycin levels as indicated F/u Nephrology plans, CRRT tolerance, de-escalation plan/LOT   Height: 6\' 1"  (185.4 cm) Weight: 156 lb 8.4 oz (71 kg) IBW/kg (Calculated) : 79.9  Temp (24hrs), Avg:95.7 F (35.4 C), Min:94.2 F (34.6 C), Max:97.1 F (36.2 C)  Recent Labs  Lab 06/24/19 0344 06/24/19 0739 06/25/19 0351 06/26/19 0333 06/26/19 1729 06/27/19 0417 06/27/19 1510 06/27/19 1523 06/28/19 0426 06/28/19 0432 06/28/19 0758  WBC 8.0  --  33.5* 32.0*  --   --   --  40.2* 31.1*  --   --   CREATININE  --   --  3.21* 2.17* 1.83* 1.69* 1.52*  --  1.29*  --   --   LATICACIDVEN  --  6.9*  --   --   --   --   --   --   --  3.4* 2.6*    Estimated Creatinine Clearance: 47.4 mL/min (A) (by C-G formula based on SCr of 1.29 mg/dL (H)).    No Known Allergies  Antimicrobials this admission: 1/8 Eraxis >> 1/5 vancomycin >>  1/5 cefepime >>  12/27 Unasyn >> 1/2  Dose adjustments this admission:   Microbiology results: 12/23 BCx: ngf 12/23 Ucx: multi flora 12/30 mrsa pcr - neg  Alanda Slim, PharmD, [facility] Clinical Pharmacist Please see AMION for all Pharmacists' Contact Phone Numbers 06/28/2019, 9:07 AM

## 2019-06-28 NOTE — Progress Notes (Signed)
[facility] Progress Note    Assessment/ Plan:   1. 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  Latest Cr was around 3 before the holidays.  Admitted 12/24 w/ creat 3.6 which worsened up to 6-7 range. UA and renal US were negative and pt had to be initiated on CRRT.  D/c'd 06/21/2019 but had to reintiate 1/4 in setting of hyperkalemia following arrest.   Maintain CRRT for now with hypotension. No heparin.  UF to net even to slightly net neg.  2. DM2 3. HTN: currently hypotensive. 4. VRDR: COVID+ PNA, intubated now; worsening oxygenation in past day. UF to prevent pulm edema o/w per primary. 5. Acute bilateral DVT- Coumadin on hold for now d/t bleeding complications 6. Shock: presumed septic, broad spectrum antibiotics per primary 7. CVA: MRI d/c'd, neurology following   8. Anemia:  Having some bleeding here - mainly oozing now.  Hb 9.4 this AM.  S/p 2u pRBC on 1/4.  On aranesp 60 qThurs. GI scoped - no interventions, transfuse as needed.  Primary having [place] discussions with family.  Prognosis seems grim.  Subjective:    Worse overnight, family contacted - discussing GOC.   Remains on CRRT. On NE gtt.  I/Os yesterday 4 / 2.9L   Objective:   BP (!) 129/50   Pulse 80   Temp (!) 96.1 F (35.6 C) (Rectal)   Resp (!) 27   Ht '6\' 1"'  (1.854 m)   Wt 71 kg   SpO2 97%   BMI 20.65 kg/m   Intake/Output Summary (Last 24 hours) at 06/28/2019 1254 Last data filed at 06/28/2019 1200 Gross per 24 hour  Intake 4308.55 ml  Output 2242 ml  Net 2066.55 ml   Weight change: -1 kg  Physical Exam: GEN: intubated and sedated on vent HEENT EOMI PERRL NECK no JVD PULM bilateral rhonchi CV RRR ABD soft EXT trace edema NEURO AAO x 3 ACCESS: R IJ TDC c/d/i   Imaging: DG CHEST PORT 1 VIEW  Result Date: 06/28/2019 CLINICAL DATA:  COVID 19 infection EXAM: PORTABLE CHEST 1 VIEW COMPARISON:  06/25/2019 FINDINGS: Endotracheal tube tip has been advanced slightly and is at the level of the  clavicular heads. The right IJ approach central venous catheter is in unchanged position. There is slightly improved aeration at the left lung base with mild residual opacity. Opacities in the upper right lung are unchanged. IMPRESSION: Slightly improved aeration of the left lung. Otherwise unchanged multifocal infection. Endotracheal tube tip at the level of the clavicular heads. Electronically Signed   By: Ulyses Jarred M.D.   On: 06/28/2019 02:22   VAS US CAROTID  Result Date: 06/27/2019 Carotid Arterial Duplex Study Indications:       CVA. Other Factors:     COVID 19 positive. Limitations        Today's exam was limited due to a central line. Comparison Study:  No prior studies. Performing Technologist: Oliver Hum RVT  Examination Guidelines: A complete evaluation includes B-mode imaging, spectral Doppler, color Doppler, and power Doppler as needed of all accessible portions of each vessel. Bilateral testing is considered an integral part of a complete examination. Limited examinations for reoccurring indications may be performed as noted.  Right Carotid Findings: +----------+--------+--------+--------+-----------------------+--------+           PSV cm/sEDV cm/sStenosisPlaque Description     Comments +----------+--------+--------+--------+-----------------------+--------+ CCA Prox  128     16              smooth and heterogenous         +----------+--------+--------+--------+-----------------------+--------+  CCA Distal74      10              smooth and heterogenous         +----------+--------+--------+--------+-----------------------+--------+ ICA Prox  84      20              smooth and heterogenous         +----------+--------+--------+--------+-----------------------+--------+ ICA Distal102     34                                              +----------+--------+--------+--------+-----------------------+--------+ ECA       80      10                                               +----------+--------+--------+--------+-----------------------+--------+ +----------+--------+-------+--------+-------------------+           PSV cm/sEDV cmsDescribeArm Pressure (mmHG) +----------+--------+-------+--------+-------------------+ Subclavian132                                        +----------+--------+-------+--------+-------------------+ +---------+--------+--+--------+--+---------+ VertebralPSV cm/s42EDV cm/s13Antegrade +---------+--------+--+--------+--+---------+  Left Carotid Findings: +----------+--------+--------+--------+------------------+---------------------+           PSV cm/sEDV cm/sStenosisPlaque DescriptionComments              +----------+--------+--------+--------+------------------+---------------------+ CCA Prox                                            Proximal CCA is not                                                       visualized.           +----------+--------+--------+--------+------------------+---------------------+ CCA Distal                                          Not visualized        +----------+--------+--------+--------+------------------+---------------------+ ICA Prox                                            Not visualized        +----------+--------+--------+--------+------------------+---------------------+ ICA Distal                                          Not visualized        +----------+--------+--------+--------+------------------+---------------------+ ECA  Not visualized        +----------+--------+--------+--------+------------------+---------------------+ +----------+--------+--------+--------+-------------------+           PSV cm/sEDV cm/sDescribeArm Pressure (mmHG) +----------+--------+--------+--------+-------------------+ Subclavian118                                          +----------+--------+--------+--------+-------------------+ +---------+--------+--------+--------------+ VertebralPSV cm/sEDV cm/sNot identified +---------+--------+--------+--------------+  Summary: Right Carotid: Velocities in the right ICA are consistent with a 1-39% stenosis. Left Carotid: Unable to obtain left sided images due to central line and               bandages. Vertebrals: Right vertebral artery demonstrates antegrade flow. *See table(s) above for measurements and observations.  Electronically signed by Antony Contras MD on 06/27/2019 at 8:09:47 AM.    Final     Labs: BMET Recent Labs  Lab 06/25/19 0351 06/25/19 1435 06/26/19 0630 06/26/19 1729 06/26/19 2202 06/27/19 0417 06/27/19 1212 06/27/19 1510 06/27/19 2000 06/28/19 0119 06/28/19 0426  NA 138 137 134* 133* 136 138 138 136 139 139 140  K 5.3* 5.0 4.2 4.0 3.8 4.3 4.0 4.0 4.1 4.1 4.1  CL 105 105 101 101  --  103  --  105  --   --  104  CO2 22 20* 21* 20*  --  23  --  22  --   --  26  GLUCOSE 120* 127* 193* 402*  --  289*  --  172*  --   --  109*  BUN 54* 49* 40* 38*  --  35*  --  27*  --   --  22  CREATININE 3.21* 2.80* 2.17* 1.83*  --  1.69*  --  1.52*  --   --  1.29*  CALCIUM 6.8* 6.5* 6.5* 6.4*  --  6.7*  --  6.5*  --   --  6.6*  PHOS 6.7* 5.4* 4.1 3.5  --  2.9  --  3.2  --   --  3.0   CBC Recent Labs  Lab 06/24/19 0344 06/25/19 0351 06/26/19 0333 06/27/19 1523 06/27/19 2000 06/28/19 0119 06/28/19 0426  WBC 8.0 33.5* 32.0* 40.2*  --   --  31.1*  NEUTROABS 6.9 30.7* 29.6*  --   --   --  28.1*  HGB 7.8* 10.5* 8.9* 8.6* 10.5* 10.5* 9.4*  HCT 24.3* 32.3* 26.9* 26.3* 31.0* 31.0* 28.5*  MCV 82.1 83.2 82.8 85.7  --   --  82.8  PLT 93* 103* 60* 65*  --   --  33*    Medications:    . sodium chloride   Intravenous Once  . vitamin C  500 mg Per Tube Daily  . B-complex with vitamin C  1 tablet Per Tube Daily  . chlorhexidine gluconate (MEDLINE KIT)  15 mL Mouth Rinse BID  . Chlorhexidine Gluconate Cloth  6  each Topical q morning - 10a  . darbepoetin (ARANESP) injection - DIALYSIS  60 mcg Intravenous Q Thu-HD  . feeding supplement (PRO-STAT SUGAR FREE 64)  30 mL Per Tube Daily  . hydrocortisone sod succinate (SOLU-CORTEF) inj  50 mg Intravenous Q6H  . insulin aspart  0-9 Units Subcutaneous Q4H  . insulin detemir  10 Units Subcutaneous BID  . mouth rinse  15 mL Mouth Rinse 10 times per day  . pantoprazole (PROTONIX) IV  40 mg Intravenous Q12H  . rosuvastatin  40 mg Per Tube Daily  .  sodium bicarbonate      . sodium chloride flush  3 mL Intravenous Q12H  . zinc sulfate  220 mg Per Tube Daily      Madelon Lips, MD [place] pgr [phone] 06/28/2019, 12:54 PM

## 2019-06-28 NOTE — Progress Notes (Signed)
Per Dr. Halford Chessman, due to low diastolic blood pressure, Goal BP is SBP>100

## 2019-06-29 ENCOUNTER — Inpatient Hospital Stay (HOSPITAL_COMMUNITY): Payer: Medicare HMO

## 2019-06-29 LAB — CBC
HCT: 24.1 % — ABNORMAL LOW (ref 39.0–52.0)
Hemoglobin: 7.9 g/dL — ABNORMAL LOW (ref 13.0–17.0)
MCH: 27.4 pg (ref 26.0–34.0)
MCHC: 32.8 g/dL (ref 30.0–36.0)
MCV: 83.7 fL (ref 80.0–100.0)
Platelets: 32 10*3/uL — ABNORMAL LOW (ref 150–400)
RBC: 2.88 MIL/uL — ABNORMAL LOW (ref 4.22–5.81)
RDW: 16.6 % — ABNORMAL HIGH (ref 11.5–15.5)
WBC: 24 10*3/uL — ABNORMAL HIGH (ref 4.0–10.5)
nRBC: 3.1 % — ABNORMAL HIGH (ref 0.0–0.2)

## 2019-06-29 LAB — RENAL FUNCTION PANEL
Albumin: 1.9 g/dL — ABNORMAL LOW (ref 3.5–5.0)
Albumin: 1.9 g/dL — ABNORMAL LOW (ref 3.5–5.0)
Albumin: 1.7 g/dL — ABNORMAL LOW (ref 3.5–5.0)
Anion gap: 2 — ABNORMAL LOW (ref 5–15)
Anion gap: 8 (ref 5–15)
Anion gap: 9 (ref 5–15)
BUN: 18 mg/dL (ref 8–23)
BUN: 23 mg/dL (ref 8–23)
BUN: 24 mg/dL — ABNORMAL HIGH (ref 8–23)
CO2: 31 mmol/L (ref 22–32)
CO2: 28 mmol/L (ref 22–32)
CO2: 27 mmol/L (ref 22–32)
Calcium: 6.5 mg/dL — ABNORMAL LOW (ref 8.9–10.3)
Calcium: 6.6 mg/dL — ABNORMAL LOW (ref 8.9–10.3)
Calcium: 6.6 mg/dL — ABNORMAL LOW (ref 8.9–10.3)
Chloride: 104 mmol/L (ref 98–111)
Chloride: 100 mmol/L (ref 98–111)
Chloride: 101 mmol/L (ref 98–111)
Creatinine, Ser: 1.24 mg/dL (ref 0.61–1.24)
Creatinine, Ser: 1.23 mg/dL (ref 0.61–1.24)
Creatinine, Ser: 1.18 mg/dL (ref 0.61–1.24)
GFR calc Af Amer: >60 mL/min (ref >60)
GFR calc Af Amer: >60 mL/min (ref >60)
GFR calc Af Amer: >60 mL/min (ref >60)
GFR calc non Af Amer: 55 mL/min — ABNORMAL LOW (ref >60)
GFR calc non Af Amer: 56 mL/min — ABNORMAL LOW (ref >60)
GFR calc non Af Amer: 59 mL/min — ABNORMAL LOW (ref >60)
Glucose, Bld: 81 mg/dL (ref 70–99)
Glucose, Bld: 318 mg/dL — ABNORMAL HIGH (ref 70–99)
Glucose, Bld: 292 mg/dL — ABNORMAL HIGH (ref 70–99)
Phosphorus: 2.1 mg/dL — ABNORMAL LOW (ref 2.5–4.6)
Phosphorus: 1.9 mg/dL — ABNORMAL LOW (ref 2.5–4.6)
Phosphorus: 1.5 mg/dL — ABNORMAL LOW (ref 2.5–4.6)
Potassium: 4 mmol/L (ref 3.5–5.1)
Potassium: 4.2 mmol/L (ref 3.5–5.1)
Potassium: 4 mmol/L (ref 3.5–5.1)
Sodium: 137 mmol/L (ref 135–145)
Sodium: 136 mmol/L (ref 135–145)
Sodium: 137 mmol/L (ref 135–145)

## 2019-06-29 LAB — GLUCOSE, CAPILLARY
Glucose-Capillary: 248 mg/dL — ABNORMAL HIGH (ref 70–99)
Glucose-Capillary: 256 mg/dL — ABNORMAL HIGH (ref 70–99)
Glucose-Capillary: 263 mg/dL — ABNORMAL HIGH (ref 70–99)
Glucose-Capillary: 270 mg/dL — ABNORMAL HIGH (ref 70–99)
Glucose-Capillary: 272 mg/dL — ABNORMAL HIGH (ref 70–99)
Glucose-Capillary: 292 mg/dL — ABNORMAL HIGH (ref 70–99)
Glucose-Capillary: 292 mg/dL — ABNORMAL HIGH (ref 70–99)

## 2019-06-29 LAB — POCT I-STAT 7, (LYTES, BLD GAS, ICA,H+H)
Acid-Base Excess: 3 mmol/L — ABNORMAL HIGH (ref 0.0–2.0)
Bicarbonate: 28.5 mmol/L — ABNORMAL HIGH (ref 20.0–28.0)
Calcium, Ion: 1.03 mmol/L — ABNORMAL LOW (ref 1.15–1.40)
HCT: 24 % — ABNORMAL LOW (ref 39.0–52.0)
Hemoglobin: 8.2 g/dL — ABNORMAL LOW (ref 13.0–17.0)
O2 Saturation: 90 %
Patient temperature: 97.6
Potassium: 4 mmol/L (ref 3.5–5.1)
Sodium: 136 mmol/L (ref 135–145)
TCO2: 30 mmol/L (ref 22–32)
pCO2 arterial: 48.5 mmHg — ABNORMAL HIGH (ref 32.0–48.0)
pH, Arterial: 7.375 (ref 7.350–7.450)
pO2, Arterial: 60 mmHg — ABNORMAL LOW (ref 83.0–108.0)

## 2019-06-29 LAB — MAGNESIUM: Magnesium: 2.3 mg/dL (ref 1.7–2.4)

## 2019-06-29 MED ORDER — MIDAZOLAM HCL 2 MG/2ML IJ SOLN
1.0000 mg | INTRAMUSCULAR | Status: DC | PRN
  Filled 2019-06-29: qty 2

## 2019-06-29 MED ORDER — FENTANYL CITRATE (PF) 100 MCG/2ML IJ SOLN
25.0000 ug | INTRAMUSCULAR | Status: DC | PRN
  Administered 2019-06-29: 50 ug via INTRAVENOUS
  Filled 2019-06-29 (×2): qty 2

## 2019-06-29 MED ORDER — INSULIN DETEMIR 100 UNIT/ML ~~LOC~~ SOLN
15.0000 [IU] | Freq: Two times a day (BID) | SUBCUTANEOUS | Status: DC
  Administered 2019-06-29 – 2019-06-30 (×3): 15 [IU] via SUBCUTANEOUS
  Filled 2019-06-29 (×4): qty 0.15

## 2019-06-29 NOTE — Progress Notes (Signed)
[name] Progress Note    Assessment/ Plan:   1. AoCKD: followed by Steve Martinez for CKD.  Latest Cr was around 3 before the holidays.  Admitted 12/24 w/ creat 3.6 which worsened up to 6-7 range. UA and renal US were negative and pt had to be initiated on CRRT.  D/c'd 06/21/2019 but had to reintiate 1/4 in setting of hyperkalemia following arrest.   Maintain CRRT for now with hypotension. No heparin.  UF to net even to slightly net neg - discussed UF with RN today and we'll aim for net even.  2. DM2 3. HTN: currently hypotensive. 4. VRDR: COVID+ PNA, intubated now; worsening oxygenation in past day. UF to prevent pulm edema o/w per primary. 5. Acute bilateral DVT- Coumadin on hold for now d/t bleeding complications 6. Shock: presumed septic, broad spectrum antibiotics per primary 7. CVA: MRI d/c'd, neurology following   8. Anemia:  Having some bleeding here - mainly oozing now.  Hb 9.4 this AM.  S/p 2u pRBC on 1/4.  On aranesp 60 qThurs. GI scoped - no interventions, transfuse as needed.  Primary having [place] discussions with family.  Prognosis seems grim.  Remains full code.  Subjective:    Remains on CRRT. On NE gtt.  I/Os yesterday 4.4 / 1L   Objective:   BP (!) 105/53   Pulse 67   Temp (!) 92.4 F (33.6 C) (Rectal)   Resp (!) 28   Ht '6\' 1"'  (1.854 m)   Wt 72 kg   SpO2 96%   BMI 20.94 kg/m   Intake/Output Summary (Last 24 hours) at 06/29/2019 1503 Last data filed at 06/29/2019 1500 Gross per 24 hour  Intake 4510.67 ml  Output 2119 ml  Net 2391.67 ml   Weight change: 1 kg  Physical Exam: GEN: intubated and sedated on vent HEENT EOMI PERRL NECK no JVD PULM bilateral rhonchi CV RRR ABD soft EXT 1+ edema NEURO AAO x 3 ACCESS: [name] c/d/i   Imaging: DG Chest Port 1 View  Result Date: 06/29/2019 CLINICAL DATA:  Respiratory failure. COVID positive. EXAM: PORTABLE CHEST 1 VIEW COMPARISON:  06/28/2019 FINDINGS: 0525 hours. Cardiopericardial silhouette is at  upper limits of normal for size. Patchy asymmetric airspace disease again noted without substantial interval change. No pneumothorax. Endotracheal tube tip is 3.3 cm above the base of the carina right sided central line tip overlies the distal SVC level. A feeding tube passes into the stomach although the distal tip position is not included on the film. Left IJ central line tip overlies the distal SVC. Telemetry leads overlie the chest. IMPRESSION: 1. No substantial interval change in exam. Patchy asymmetric airspace disease. 2. Stable support apparatus. No evidence for pneumothorax. Electronically Signed   By: Misty Stanley M.D.   On: 06/29/2019 08:46   DG CHEST PORT 1 VIEW  Result Date: 06/28/2019 CLINICAL DATA:  COVID 19 infection EXAM: PORTABLE CHEST 1 VIEW COMPARISON:  06/25/2019 FINDINGS: Endotracheal tube tip has been advanced slightly and is at the level of the clavicular heads. The right IJ approach central venous catheter is in unchanged position. There is slightly improved aeration at the left lung base with mild residual opacity. Opacities in the upper right lung are unchanged. IMPRESSION: Slightly improved aeration of the left lung. Otherwise unchanged multifocal infection. Endotracheal tube tip at the level of the clavicular heads. Electronically Signed   By: Ulyses Jarred M.D.   On: 06/28/2019 02:22    Labs: BMET Recent Labs  Lab 06/26/19 0333 06/26/19 1729 06/27/19 0417 06/27/19 1510 06/27/19 2000 06/28/19 0119 06/28/19 0426 06/28/19 1514 06/29/19 0209 06/29/19 0359  NA 134* 133* 138 136 139 139 140 137 136 136  K 4.2 4.0 4.3 4.0 4.1 4.1 4.1 4.0 4.0 4.2  CL 101 101 103 105  --   --  104 104  --  100  CO2 21* 20* 23 22  --   --  26 31  --  28  GLUCOSE 193* 402* 289* 172*  --   --  109* 81  --  318*  BUN 40* 38* 35* 27*  --   --  22 18  --  23  CREATININE 2.17* 1.83* 1.69* 1.52*  --   --  1.29* 1.24  --  1.23  CALCIUM 6.5* 6.4* 6.7* 6.5*  --   --  6.6* 6.5*  --  6.6*  PHOS  4.1 3.5 2.9 3.2  --   --  3.0 2.1*  --  1.9*   CBC Recent Labs  Lab 06/24/19 0344 06/25/19 0351 06/26/19 0333 06/27/19 1523 06/28/19 0119 06/28/19 0426 06/29/19 0209 06/29/19 0359  WBC 8.0 33.5* 32.0* 40.2*  --  31.1*  --  24.0*  NEUTROABS 6.9 30.7* 29.6*  --   --  28.1*  --   --   HGB 7.8* 10.5* 8.9* 8.6* 10.5* 9.4* 8.2* 7.9*  HCT 24.3* 32.3* 26.9* 26.3* 31.0* 28.5* 24.0* 24.1*  MCV 82.1 83.2 82.8 85.7  --  82.8  --  83.7  PLT 93* 103* 60* 65*  --  33*  --  32*    Medications:    . vitamin C  500 mg Per Tube Daily  . B-complex with vitamin C  1 tablet Per Tube Daily  . chlorhexidine gluconate (MEDLINE KIT)  15 mL Mouth Rinse BID  . Chlorhexidine Gluconate Cloth  6 each Topical q morning - 10a  . darbepoetin (ARANESP) injection - DIALYSIS  60 mcg Intravenous Q Thu-HD  . feeding supplement (PRO-STAT SUGAR FREE 64)  30 mL Per Tube Daily  . hydrocortisone sod succinate (SOLU-CORTEF) inj  50 mg Intravenous Q6H  . insulin aspart  0-9 Units Subcutaneous Q4H  . insulin detemir  15 Units Subcutaneous BID  . mouth rinse  15 mL Mouth Rinse 10 times per day  . pantoprazole (PROTONIX) IV  40 mg Intravenous Q12H  . rosuvastatin  40 mg Per Tube Daily  . sodium chloride flush  3 mL Intravenous Q12H  . zinc sulfate  220 mg Per Tube Daily

## 2019-06-29 NOTE — Progress Notes (Signed)
[place] attempted video call for family member through email address given. Family did not appear on the screen. Bedside nurse nurse aware.

## 2019-06-29 NOTE — Progress Notes (Signed)
Spoke with pt's son and wife over the phone.    Explained his current status and events that lead up to this.  Explained that he is not showing signs of improvement at this time and in fact might be getting worse.  Explained he is on maximal therapy already.  They would like to continue current therapies for now.  They will need more time to think about DNR status and whether to transition to comfort measures.  Time spent 26 minutes.  Chesley Mires, MD [facility] Pulmonary/Critical Care 06/29/2019, 12:17 PM

## 2019-06-29 NOTE — Progress Notes (Signed)
NAME:  Steve Martinez, MRN:  [id number], DOB:  01/14/1941, LOS: 18 ADMISSION DATE:  06/03/2019, CONSULTATION DATE:  06/18/2019 REFERRING MD:  Dr. Candiss Norse, CHIEF COMPLAINT:  Hypotension  Brief History   79 yo male presented with weakness, dyspnea, hypoxia from COVID 19 pneumonia.  Course complicated by aspiration pneumonia, b/l leg DVT, and AKI.  Transferred to [facility] for iHD.  Developed septic shock and transferred to ICU.  Past Medical History  HTN, HLD, DMT2, GERD, CVA with left sided deficits/weakness, MI, CKD  Significant Hospital Events   12/23 Admitted [place] 12/29 transfer [facility]/ TDC placement 12/30 did not tolerated HD (became unresponsive), started on CRRT, tocilizumab x 1 01/02 Transfer out of ICU 01/04 PEA arrest on floor post HD, intubated and transferred back to ICU 01/05 concern for sepsis >> restart ABx 01/07 Blood in stool  Consults:  Nephrology 12/28 Neurology 1/05 >> s/o 1/08 Gastroenterology 1/07   Procedures:  12/29 Right TDC >> 1/4 ETT >  1/4  Lt IJ CVL >> 1/4 Rt radial A line >>   Significant Diagnostic Tests:  12/24 DVT BLE [place] >> acute DVT Rt peroneal vein; SVT Lt small saphenous vein 12/26 renal US >> chronic medical renal disease 12/27 TTE >> EF greater than 75% 1/5 CT head >>  Non hemorrhagic Lt PICA infarct acute/subacute, multiple remote lacunar infarcts in BS b/l, atrophy, chronic microvascular ischemia 1/5 EEG >> continuous rhythmic slowing 1/6 Echo >> EF 79 yo 70%  Micro Data:  12/23 SARS Coronavirus 2 >> POSITIVE 12/23 Blood >> Negative 1/5 respiratory >> Candida albicans 1/5 blood cultures >>   Antimicrobials:  Amp/Sulbactam 12/27>> Ceftriaxone 12/23 x 1 dose Doxycycline 12/23 x 1 dose Remdesivir 12/23>>12/27 Decadron 12/23>> x10 days Tocilizumab 12/30  X 1 Vancomycin 1/05 >> 1/09 Cefepime 1/05 >>  Eraxis 1/07 >>  Interim history/subjective:  Remains on vent, pressors, CRRT.  Not on sedation.  Objective   Blood pressure (!) 123/52,  pulse 77, temperature 97.6 F (36.4 C), temperature source Axillary, resp. rate (!) 33, height 6\' 1"  (1.854 m), weight 72 kg, SpO2 96 %. CVP:  [3 mmHg-13 mmHg] 3 mmHg  Vent Mode: PRVC FiO2 (%):  [70 %-80 %] 70 % Set Rate:  [30 bmp] 30 bmp Vt Set:  [450 mL] 450 mL PEEP:  [10 cmH20] 10 cmH20 Delta P (Amplitude):  [95] 95 Plateau Pressure:  [20 cmH20-32 cmH20] 20 cmH20   Intake/Output Summary (Last 24 hours) at 06/29/2019 1048 Last data filed at 06/29/2019 1000 Gross per 24 hour  Intake 4655.54 ml  Output 1544 ml  Net 3111.54 ml   Filed Weights   06/27/19 0413 06/28/19 0500 06/29/19 0409  Weight: 72 kg 71 kg 72 kg   Physical Exam:  General - sedated Eyes - pupils pinpoint ENT - ETT in place Cardiac - regular rate/rhythm, no murmur Chest - b/l rales Abdomen - soft, non tender, + bowel sounds Extremities - no cyanosis, clubbing, or edema Skin - no rashes Neuro - partially opens eyes spontaneously, no response to painful stimuli  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 - full vent support - adjust PEEP/FiO2 to keep SpO2 88 to 95%  Septic shock. - pressors to keep MAP > 65 - stress steroids while on pressors - day 5 cefepime  - day 3 eraxis - will d/c vancomycin  AKI from ATN. Metabolic acidosis. - CRRT  Acute metabolic  encephalopathy with acute CVA after PEA arrest with hx of CVA. - monitor mental status off sedation; off since 06/26/19 - bicarb gtt  PEA cardiac arrest from hypoxia. Hx of CAD, HTN, HLD. - monitor hemodynamics  B/l DVT. - anticoagulation held in setting of GI bleed  GI bleeding. - concern for ischemic colitis and gastritis - continue protonix  Thrombocytopenia in setting of sepsis. - f/u CBC  DM type II. - SSI with levemir  Goals of care. - prognosis for meaningful recovery seems poor - will contact family to discuss goals of care  Best  practice:  Nutrition: tube feeds DVT prophylaxis: SCDs SUP: Protonix Mobility: bed rest Goals of care: full code Disposition: ICU  Labs:   CMP Latest Ref Rng & Units 06/29/2019 06/29/2019 06/28/2019  Glucose 70 - 99 mg/dL 318(H) - 81  BUN 8 - 23 mg/dL 23 - 18  Creatinine 0.61 - 1.24 mg/dL 1.23 - 1.24  Sodium 135 - 145 mmol/L 136 136 137  Potassium 3.5 - 5.1 mmol/L 4.2 4.0 4.0  Chloride 98 - 111 mmol/L 100 - 104  CO2 22 - 32 mmol/L 28 - 31  Calcium 8.9 - 10.3 mg/dL 6.6(L) - 6.5(L)  Total Protein 6.5 - 8.1 g/dL - - -  Total Bilirubin 0.3 - 1.2 mg/dL - - -  Alkaline Phos 38 - 126 U/L - - -  AST 15 - 41 U/L - - -  ALT 0 - 44 U/L - - -    CBC Latest Ref Rng & Units 06/29/2019 06/29/2019 06/28/2019  WBC 4.0 - 10.5 K/uL 24.0(H) - 31.1(H)  Hemoglobin 13.0 - 17.0 g/dL 7.9(L) 8.2(L) 9.4(L)  Hematocrit 39.0 - 52.0 % 24.1(L) 24.0(L) 28.5(L)  Platelets 150 - 400 K/uL 32(L) - 33(L)    ABG    Component Value Date/Time   PHART 7.375 06/29/2019 0209   PCO2ART 48.5 (H) 06/29/2019 0209   PO2ART 60.0 (L) 06/29/2019 0209   HCO3 28.5 (H) 06/29/2019 0209   TCO2 30 06/29/2019 0209   ACIDBASEDEF 4.0 (H) 06/28/2019 0119   O2SAT 90.0 06/29/2019 0209    CBG (last 3)  Recent Labs    06/28/19 2355 06/29/19 0431 06/29/19 0744  GLUCAP 191* 292* 292*    CC time 32 minutes  Chesley Mires, MD [facility] 06/29/2019, 10:48 AM

## 2019-06-29 NOTE — Progress Notes (Signed)
Assisted tele visit to patient with family member.  Samari Gorby Ann, RN  

## 2019-06-30 ENCOUNTER — Inpatient Hospital Stay (HOSPITAL_COMMUNITY): Payer: Medicare HMO

## 2019-06-30 LAB — RENAL FUNCTION PANEL
Albumin: 1.7 g/dL — ABNORMAL LOW (ref 3.5–5.0)
Albumin: 1.6 g/dL — ABNORMAL LOW (ref 3.5–5.0)
Anion gap: 6 (ref 5–15)
Anion gap: 8 (ref 5–15)
BUN: 21 mg/dL (ref 8–23)
BUN: 42 mg/dL — ABNORMAL HIGH (ref 8–23)
CO2: 28 mmol/L (ref 22–32)
CO2: 27 mmol/L (ref 22–32)
Calcium: 6.4 mg/dL — CL (ref 8.9–10.3)
Calcium: 6.7 mg/dL — ABNORMAL LOW (ref 8.9–10.3)
Chloride: 101 mmol/L (ref 98–111)
Chloride: 102 mmol/L (ref 98–111)
Creatinine, Ser: 1.08 mg/dL (ref 0.61–1.24)
Creatinine, Ser: 1.78 mg/dL — ABNORMAL HIGH (ref 0.61–1.24)
GFR calc Af Amer: >60 mL/min (ref >60)
GFR calc Af Amer: 41 mL/min — ABNORMAL LOW (ref >60)
GFR calc non Af Amer: >60 mL/min (ref >60)
GFR calc non Af Amer: 36 mL/min — ABNORMAL LOW (ref >60)
Glucose, Bld: 282 mg/dL — ABNORMAL HIGH (ref 70–99)
Glucose, Bld: 325 mg/dL — ABNORMAL HIGH (ref 70–99)
Phosphorus: 1.3 mg/dL — ABNORMAL LOW (ref 2.5–4.6)
Phosphorus: 1.8 mg/dL — ABNORMAL LOW (ref 2.5–4.6)
Potassium: 4.2 mmol/L (ref 3.5–5.1)
Potassium: 4.8 mmol/L (ref 3.5–5.1)
Sodium: 135 mmol/L (ref 135–145)
Sodium: 137 mmol/L (ref 135–145)

## 2019-06-30 LAB — GLUCOSE, CAPILLARY
Glucose-Capillary: 201 mg/dL — ABNORMAL HIGH (ref 70–99)
Glucose-Capillary: 216 mg/dL — ABNORMAL HIGH (ref 70–99)
Glucose-Capillary: 270 mg/dL — ABNORMAL HIGH (ref 70–99)
Glucose-Capillary: 270 mg/dL — ABNORMAL HIGH (ref 70–99)
Glucose-Capillary: 273 mg/dL — ABNORMAL HIGH (ref 70–99)
Glucose-Capillary: 301 mg/dL — ABNORMAL HIGH (ref 70–99)
Glucose-Capillary: 305 mg/dL — ABNORMAL HIGH (ref 70–99)

## 2019-06-30 LAB — CBC
HCT: 23.4 % — ABNORMAL LOW (ref 39.0–52.0)
Hemoglobin: 7.7 g/dL — ABNORMAL LOW (ref 13.0–17.0)
MCH: 27.8 pg (ref 26.0–34.0)
MCHC: 32.9 g/dL (ref 30.0–36.0)
MCV: 84.5 fL (ref 80.0–100.0)
Platelets: 35 10*3/uL — ABNORMAL LOW (ref 150–400)
RBC: 2.77 MIL/uL — ABNORMAL LOW (ref 4.22–5.81)
RDW: 16.8 % — ABNORMAL HIGH (ref 11.5–15.5)
WBC: 21 10*3/uL — ABNORMAL HIGH (ref 4.0–10.5)
nRBC: 15.2 % — ABNORMAL HIGH (ref 0.0–0.2)

## 2019-06-30 LAB — CULTURE, BLOOD (ROUTINE X 2)
Culture: NO GROWTH
Culture: NO GROWTH

## 2019-06-30 LAB — MAGNESIUM: Magnesium: 2.3 mg/dL (ref 1.7–2.4)

## 2019-06-30 MED ORDER — INSULIN ASPART 100 UNIT/ML ~~LOC~~ SOLN
2.0000 [IU] | SUBCUTANEOUS | Status: DC
  Administered 2019-06-30 (×4): 6 [IU] via SUBCUTANEOUS

## 2019-06-30 MED ORDER — DEXTROSE 10 % IV SOLN: INTRAVENOUS | Status: DC

## 2019-06-30 MED ORDER — INSULIN ASPART 100 UNIT/ML ~~LOC~~ SOLN
2.0000 [IU] | SUBCUTANEOUS | Status: DC
  Administered 2019-06-30 (×5): 2 [IU] via SUBCUTANEOUS

## 2019-06-30 MED ORDER — PANTOPRAZOLE SODIUM 40 MG PO PACK
40.0000 mg | PACK | Freq: Two times a day (BID) | ORAL | Status: DC
  Administered 2019-06-30 – 2019-07-01 (×2): 40 mg
  Filled 2019-06-30 (×2): qty 20

## 2019-06-30 MED ORDER — CALCIUM GLUCONATE-NACL 1-0.675 GM/50ML-% IV SOLN
1.0000 g | Freq: Once | INTRAVENOUS | Status: AC
  Administered 2019-06-30: 1000 mg via INTRAVENOUS
  Filled 2019-06-30: qty 50

## 2019-06-30 NOTE — Progress Notes (Signed)
CRITICAL VALUE ALERT  Critical Value:  Ca 6.4  Date & Time Notied:  06/30/19  Provider Notified: Warren Lacy  Orders Received/Actions taken:

## 2019-06-30 NOTE — Progress Notes (Signed)
10:40 Transported pt to MRI with RN at bedside

## 2019-06-30 NOTE — Progress Notes (Signed)
[facility] Progress Note Patient Name: Steve Martinez DOB: 08/11/1940 MRN: [id number]   Date of Service  06/30/2019  HPI/Events of Note  Low corrected calcium  eICU Interventions  1g IV calcium ordered     Intervention Category Minor Interventions: Electrolytes abnormality - evaluation and management  Marily Lente Olie Dibert 06/30/2019, 5:04 AM

## 2019-06-30 NOTE — Progress Notes (Signed)
NAME:  Steve Martinez, MRN:  [id number], DOB:  09-06-1940, LOS: 87 ADMISSION DATE:  06/10/2019, CONSULTATION DATE:  06/18/2019 REFERRING MD:  Dr. Candiss Norse, CHIEF COMPLAINT:  Hypotension  Brief History   79 yo male presented with weakness, dyspnea, hypoxia from COVID 19 pneumonia.  Course complicated by aspiration pneumonia, b/l leg DVT, and AKI.  Transferred to [facility] for iHD.  Developed septic shock and transferred to ICU.  Past Medical History  HTN, HLD, DMT2, GERD, CVA with left sided deficits/weakness, MI, CKD  Significant Hospital Events   12/23 Admitted [place] 12/29 transfer [facility]/ TDC placement 12/30 did not tolerated HD (became unresponsive), started on CRRT, tocilizumab x 1 01/02 Transfer out of ICU 01/04 PEA arrest on floor post HD, intubated and transferred back to ICU 01/05 concern for sepsis >> restart ABx 01/07 Blood in stool  Consults:  Nephrology 12/28 Neurology 1/05 >> s/o 1/08 Gastroenterology 1/07   Procedures:  12/29 Right TDC >> 1/4 ETT >  1/4  Lt IJ CVL >> 1/4 Rt radial A line >>   Significant Diagnostic Tests:  12/24 DVT BLE [place] >> acute DVT Rt peroneal vein; SVT Lt small saphenous vein 12/26 renal US >> chronic medical renal disease 12/27 TTE >> EF greater than 75% 1/5 CT head >>  Non hemorrhagic Lt PICA infarct acute/subacute, multiple remote lacunar infarcts in BS b/l, atrophy, chronic microvascular ischemia 1/5 EEG >> continuous rhythmic slowing 1/6 Echo >> EF 78 yo 70%  Micro Data:  12/23 SARS Coronavirus 2 >> POSITIVE 12/23 Blood >> Negative 1/5 respiratory >> Candida albicans 1/5 blood cultures >>   Antimicrobials:  Amp/Sulbactam 12/27>> Ceftriaxone 12/23 x 1 dose Doxycycline 12/23 x 1 dose Remdesivir 12/23>>12/27 Decadron 12/23>> x10 days Tocilizumab 12/30  X 1 Vancomycin 1/05 >> 1/09 Cefepime 1/05 >>  Eraxis 1/07 >>  Interim history/subjective:  Remains on vent, CRRT, pressors.  Objective   Blood pressure (!) 122/52, pulse 80, temperature  97.6 F (36.4 C), temperature source Oral, resp. rate (!) 34, height 6\' 1"  (1.854 m), weight 72.5 kg, SpO2 95 %. CVP:  [2 mmHg-7 mmHg] 2 mmHg  Vent Mode: PRVC FiO2 (%):  [60 %] 60 % Set Rate:  [3 bmp-30 bmp] 30 bmp Vt Set:  [450 mL] 450 mL PEEP:  [8 cmH20] 8 cmH20 Delta P (Amplitude):  [95] 95 Plateau Pressure:  [20 cmH20-25 cmH20] 25 cmH20   Intake/Output Summary (Last 24 hours) at 06/30/2019 0834 Last data filed at 06/30/2019 0800 Gross per 24 hour  Intake 4551.15 ml  Output 4089 ml  Net 462.15 ml   Filed Weights   06/28/19 0500 06/29/19 0409 06/30/19 0500  Weight: 71 kg 72 kg 72.5 kg   Physical Exam:  General - unresponsive Eyes - roving eye movements ENT - ETT in place Cardiac - regular rate/rhythm, no murmur Chest - b/l rhonchi Abdomen - soft, non tender, + bowel sounds Extremities - decreased muscle bulk Skin - no rashes Neuro - no response to painful stimuli  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 - full vent support - likely would need trach to assist with vent weaning if family wishes to continue aggressive therapy - adjust PEEP/FiO2 to keep SpO2 88 to 95%  Septic shock. - pressors to keep MAP > 65 - stress steroids while on pressors - day 6/7 cefepime - day 3/7 eraxis  AKI from ATN. Metabolic acidosis. - CRRT  Acute metabolic encephalopathy with acute CVA after PEA arrest with hx of CVA. - monitor mental status off sedation; off since 06/26/19 - will arrange for MRI brain  PEA cardiac arrest from hypoxia. Hx of CAD, HTN, HLD. - monitor hemodynamics  B/l DVT. - anticoagulation held in setting of GI bleed  GI bleeding. - concern for ischemic colitis and gastritis - continue protonix  Thrombocytopenia in setting of sepsis. - f/u CBC  DM type II. - SSI with levemir - add tube feed coverage  Goals of care. - prognosis for meaningful recovery  seems poor - had length d/w family on 1/09 >> still hopeful - will await MRI brain results and then reassess d/w family about goals of care  Best practice:  Nutrition: tube feeds DVT prophylaxis: SCDs SUP: Protonix Mobility: bed rest Goals of care: full code Disposition: ICU  Labs:   CMP Latest Ref Rng & Units 06/30/2019 06/29/2019 06/29/2019  Glucose 70 - 99 mg/dL 282(H) 292(H) 318(H)  BUN 8 - 23 mg/dL 21 24(H) 23  Creatinine 0.61 - 1.24 mg/dL 1.08 1.18 1.23  Sodium 135 - 145 mmol/L 135 137 136  Potassium 3.5 - 5.1 mmol/L 4.2 4.0 4.2  Chloride 98 - 111 mmol/L 101 101 100  CO2 22 - 32 mmol/L 28 27 28   Calcium 8.9 - 10.3 mg/dL 6.4(LL) 6.6(L) 6.6(L)  Total Protein 6.5 - 8.1 g/dL - - -  Total Bilirubin 0.3 - 1.2 mg/dL - - -  Alkaline Phos 38 - 126 U/L - - -  AST 15 - 41 U/L - - -  ALT 0 - 44 U/L - - -    CBC Latest Ref Rng & Units 06/30/2019 06/29/2019 06/29/2019  WBC 4.0 - 10.5 K/uL 21.0(H) 24.0(H) -  Hemoglobin 13.0 - 17.0 g/dL 7.7(L) 7.9(L) 8.2(L)  Hematocrit 39.0 - 52.0 % 23.4(L) 24.1(L) 24.0(L)  Platelets 150 - 400 K/uL 35(L) 32(L) -    ABG    Component Value Date/Time   PHART 7.375 06/29/2019 0209   PCO2ART 48.5 (H) 06/29/2019 0209   PO2ART 60.0 (L) 06/29/2019 0209   HCO3 28.5 (H) 06/29/2019 0209   TCO2 30 06/29/2019 0209   ACIDBASEDEF 4.0 (H) 06/28/2019 0119   O2SAT 90.0 06/29/2019 0209    CBG (last 3)  Recent Labs    06/29/19 2332 06/30/19 0307 06/30/19 0811  GLUCAP 248* 273* 201*    CC time 34 minutes  Chesley Mires, MD [facility] Pulmonary/Critical Care 06/30/2019, 8:34 AM

## 2019-06-30 NOTE — Progress Notes (Addendum)
[facility] Progress Note    Assessment/ Plan:   1. AoCKD: followed by Steve Martinez for CKD.  Latest Cr was around 3 before the holidays.  Admitted 12/24 w/ creat 3.6 which worsened up to 6-7 range. UA and renal US were negative and pt had to be initiated on CRRT.  D/c'd 06/21/2019 but had to reintiate 1/4 in setting of hyperkalemia following arrest.   Pause CRRT after MRI  for next 12-24hr due to high dialysis census;  his labs and volume status are acceptable to make this pause in treatment. No heparin.  2. DM2 3. HTN: currently hypotensive. 4. VRDR: COVID+ PNA, intubated now; worsening oxygenation in past day. UF to prevent pulm edema o/w per primary. 5. Acute bilateral DVT- Coumadin on hold for now d/t bleeding complications 6. Shock: presumed septic, broad spectrum antibiotics per primary 7. CVA: MRI d/c'd, neurology following   8. Anemia:  Having some bleeding here - mainly oozing now.  Hb 7.7 this AM.  S/p 2u pRBC on 1/4.  On aranesp 60 qThurs. GI scoped - no interventions, transfuse as needed.  Primary having [place] discussions with family.  Prognosis seems grim.  Remains full code.  Subjective:    Remains on CRRT. On NE gtt low dose  I/Os yesterday 4.4 / 4.1L MRI today to help with prognosis   Objective:   BP (!) 122/52   Pulse 80   Temp 97.6 F (36.4 C) (Oral)   Resp (!) 34   Ht 6' 1" (1.854 m)   Wt 72.5 kg   SpO2 95%   BMI 21.09 kg/m   Intake/Output Summary (Last 24 hours) at 06/30/2019 1132 Last data filed at 06/30/2019 1000 Gross per 24 hour  Intake 4436.23 ml  Output 3707 ml  Net 729.23 ml   Weight change: 0.5 kg  Physical Exam: GEN: intubated and sedated on vent HEENT EOMI PERRL NECK no JVD PULM bilateral rhonchi CV RRR ABD soft EXT 1+ edema NEURO AAO x 3 ACCESS: Retta Diones [facility] c/d/i   Imaging: DG Chest Port 1 View  Result Date: 06/30/2019 CLINICAL DATA:  Shortness of breath. COVID positive. EXAM: PORTABLE CHEST 1 VIEW COMPARISON:  06/29/2019  FINDINGS: 0339 hours. Endotracheal tube tip is 4.6 cm above the base of the carina. Right central line tip overlies the SVC/RA junction. A feeding tube passes into the stomach although the distal tip position is not included on the film. Diffuse interstitial and patchy alveolar opacity is similar to prior although there appears to be a small left pleural effusion on today's study. The cardio pericardial silhouette is enlarged. The visualized bony structures of the thorax are intact. Telemetry leads overlie the chest. IMPRESSION: 1. Stable exam. Diffuse interstitial and patchy alveolar opacity with small left pleural effusion. Electronically Signed   By: Misty Stanley M.D.   On: 06/30/2019 04:36   DG Chest Port 1 View  Result Date: 06/29/2019 CLINICAL DATA:  Respiratory failure. COVID positive. EXAM: PORTABLE CHEST 1 VIEW COMPARISON:  06/28/2019 FINDINGS: 0525 hours. Cardiopericardial silhouette is at upper limits of normal for size. Patchy asymmetric airspace disease again noted without substantial interval change. No pneumothorax. Endotracheal tube tip is 3.3 cm above the base of the carina right sided central line tip overlies the distal SVC level. A feeding tube passes into the stomach although the distal tip position is not included on the film. Left IJ central line tip overlies the distal SVC. Telemetry leads overlie the chest. IMPRESSION: 1. No substantial interval change  in exam. Patchy asymmetric airspace disease. 2. Stable support apparatus. No evidence for pneumothorax. Electronically Signed   By: Misty Stanley M.D.   On: 06/29/2019 08:46    Labs: BMET Recent Labs  Lab 06/27/19 0417 06/27/19 1510 06/28/19 0119 06/28/19 0426 06/28/19 1514 06/29/19 0209 06/29/19 0359 06/29/19 1506 06/30/19 0311  NA 138 136 139 140 137 136 136 137 135  K 4.3 4.0 4.1 4.1 4.0 4.0 4.2 4.0 4.2  CL 103 105  --  104 104  --  100 101 101  CO2 23 22  --  26 31  --  _0 GLUCOSE 289* 172*  --  109* 81  --   318* 292* 282*  BUN 35* 27*  --  22 18  --  23 24* 21  CREATININE 1.69* 1.52*  --  1.29* 1.24  --  1.23 1.18 1.08  CALCIUM 6.7* 6.5*  --  6.6* 6.5*  --  6.6* 6.6* 6.4*  PHOS 2.9 3.2  --  3.0 2.1*  --  1.9* 1.5* 1.3*   CBC Recent Labs  Lab 06/24/19 0344 06/25/19 0351 06/26/19 0333 06/27/19 1523 06/28/19 0426 06/29/19 0209 06/29/19 0359 06/30/19 0311  WBC 8.0 33.5* 32.0* 40.2* 31.1*  --  24.0* 21.0*  NEUTROABS 6.9 30.7* 29.6*  --  28.1*  --   --   --   HGB 7.8* 10.5* 8.9* 8.6* 9.4* 8.2* 7.9* 7.7*  HCT 24.3* 32.3* 26.9* 26.3* 28.5* 24.0* 24.1* 23.4*  MCV 82.1 83.2 82.8 85.7 82.8  --  83.7 84.5  PLT 93* 103* 60* 65* 33*  --  32* 35*    Medications:    . B-complex with vitamin C  1 tablet Per Tube Daily  . chlorhexidine gluconate (MEDLINE KIT)  15 mL Mouth Rinse BID  . Chlorhexidine Gluconate Cloth  6 each Topical q morning - 10a  . darbepoetin (ARANESP) injection - DIALYSIS  60 mcg Intravenous Q Thu-HD  . feeding supplement (PRO-STAT SUGAR FREE 64)  30 mL Per Tube Daily  . hydrocortisone sod succinate (SOLU-CORTEF) inj  50 mg Intravenous Q6H  . insulin aspart  2 Units Subcutaneous Q4H  . insulin aspart  2-6 Units Subcutaneous Q4H  . insulin detemir  15 Units Subcutaneous BID  . mouth rinse  15 mL Mouth Rinse 10 times per day  . pantoprazole sodium  40 mg Per Tube BID  . rosuvastatin  40 mg Per Tube Daily  . sodium chloride flush  3 mL Intravenous Q12H

## 2019-07-01 ENCOUNTER — Other Ambulatory Visit: Payer: Self-pay

## 2019-07-01 ENCOUNTER — Inpatient Hospital Stay (HOSPITAL_COMMUNITY): Payer: Medicare HMO

## 2019-07-01 DIAGNOSIS — I469 Cardiac arrest, cause unspecified: Secondary | ICD-10-CM

## 2019-07-01 LAB — RENAL FUNCTION PANEL
Albumin: 1.5 g/dL — ABNORMAL LOW (ref 3.5–5.0)
Albumin: 1.6 g/dL — ABNORMAL LOW (ref 3.5–5.0)
Anion gap: 7 (ref 5–15)
Anion gap: 10 (ref 5–15)
BUN: 50 mg/dL — ABNORMAL HIGH (ref 8–23)
BUN: 66 mg/dL — ABNORMAL HIGH (ref 8–23)
CO2: 28 mmol/L (ref 22–32)
CO2: 27 mmol/L (ref 22–32)
Calcium: 6.9 mg/dL — ABNORMAL LOW (ref 8.9–10.3)
Calcium: 7 mg/dL — ABNORMAL LOW (ref 8.9–10.3)
Chloride: 102 mmol/L (ref 98–111)
Chloride: 101 mmol/L (ref 98–111)
Creatinine, Ser: 2.1 mg/dL — ABNORMAL HIGH (ref 0.61–1.24)
Creatinine, Ser: 2.54 mg/dL — ABNORMAL HIGH (ref 0.61–1.24)
GFR calc Af Amer: 34 mL/min — ABNORMAL LOW (ref >60)
GFR calc Af Amer: 27 mL/min — ABNORMAL LOW (ref >60)
GFR calc non Af Amer: 29 mL/min — ABNORMAL LOW (ref >60)
GFR calc non Af Amer: 23 mL/min — ABNORMAL LOW (ref >60)
Glucose, Bld: 361 mg/dL — ABNORMAL HIGH (ref 70–99)
Glucose, Bld: 208 mg/dL — ABNORMAL HIGH (ref 70–99)
Phosphorus: 2 mg/dL — ABNORMAL LOW (ref 2.5–4.6)
Phosphorus: 3.1 mg/dL (ref 2.5–4.6)
Potassium: 5.2 mmol/L — ABNORMAL HIGH (ref 3.5–5.1)
Potassium: 5.2 mmol/L — ABNORMAL HIGH (ref 3.5–5.1)
Sodium: 137 mmol/L (ref 135–145)
Sodium: 138 mmol/L (ref 135–145)

## 2019-07-01 LAB — COMPREHENSIVE METABOLIC PANEL
ALT: 108 U/L — ABNORMAL HIGH (ref 0–44)
AST: 366 U/L — ABNORMAL HIGH (ref 15–41)
Albumin: 1.3 g/dL — ABNORMAL LOW (ref 3.5–5.0)
Alkaline Phosphatase: 222 U/L — ABNORMAL HIGH (ref 38–126)
Anion gap: 10 (ref 5–15)
BUN: 68 mg/dL — ABNORMAL HIGH (ref 8–23)
CO2: 26 mmol/L (ref 22–32)
Calcium: 9 mg/dL (ref 8.9–10.3)
Chloride: 105 mmol/L (ref 98–111)
Creatinine, Ser: 2.77 mg/dL — ABNORMAL HIGH (ref 0.61–1.24)
GFR calc Af Amer: 24 mL/min — ABNORMAL LOW (ref >60)
GFR calc non Af Amer: 21 mL/min — ABNORMAL LOW (ref >60)
Glucose, Bld: 198 mg/dL — ABNORMAL HIGH (ref 70–99)
Potassium: 5.3 mmol/L — ABNORMAL HIGH (ref 3.5–5.1)
Sodium: 141 mmol/L (ref 135–145)
Total Bilirubin: 0.6 mg/dL (ref 0.3–1.2)
Total Protein: 3.5 g/dL — ABNORMAL LOW (ref 6.5–8.1)

## 2019-07-01 LAB — GLUCOSE, CAPILLARY
Glucose-Capillary: 351 mg/dL — ABNORMAL HIGH (ref 70–99)
Glucose-Capillary: 306 mg/dL — ABNORMAL HIGH (ref 70–99)
Glucose-Capillary: 256 mg/dL — ABNORMAL HIGH (ref 70–99)
Glucose-Capillary: 253 mg/dL — ABNORMAL HIGH (ref 70–99)
Glucose-Capillary: 263 mg/dL — ABNORMAL HIGH (ref 70–99)
Glucose-Capillary: 273 mg/dL — ABNORMAL HIGH (ref 70–99)
Glucose-Capillary: 312 mg/dL — ABNORMAL HIGH (ref 70–99)
Glucose-Capillary: 244 mg/dL — ABNORMAL HIGH (ref 70–99)
Glucose-Capillary: 276 mg/dL — ABNORMAL HIGH (ref 70–99)
Glucose-Capillary: 254 mg/dL — ABNORMAL HIGH (ref 70–99)
Glucose-Capillary: 213 mg/dL — ABNORMAL HIGH (ref 70–99)
Glucose-Capillary: 196 mg/dL — ABNORMAL HIGH (ref 70–99)
Glucose-Capillary: 197 mg/dL — ABNORMAL HIGH (ref 70–99)
Glucose-Capillary: 167 mg/dL — ABNORMAL HIGH (ref 70–99)
Glucose-Capillary: 162 mg/dL — ABNORMAL HIGH (ref 70–99)
Glucose-Capillary: 146 mg/dL — ABNORMAL HIGH (ref 70–99)
Glucose-Capillary: 175 mg/dL — ABNORMAL HIGH (ref 70–99)

## 2019-07-01 LAB — POCT I-STAT 7, (LYTES, BLD GAS, ICA,H+H)
Acid-base deficit: 4 mmol/L — ABNORMAL HIGH (ref 0.0–2.0)
Bicarbonate: 24.6 mmol/L (ref 20.0–28.0)
Calcium, Ion: 1.13 mmol/L — ABNORMAL LOW (ref 1.15–1.40)
HCT: 22 % — ABNORMAL LOW (ref 39.0–52.0)
Hemoglobin: 7.5 g/dL — ABNORMAL LOW (ref 13.0–17.0)
O2 Saturation: 95 %
Potassium: 5 mmol/L (ref 3.5–5.1)
Sodium: 139 mmol/L (ref 135–145)
TCO2: 27 mmol/L (ref 22–32)
pCO2 arterial: 64.6 mmHg — ABNORMAL HIGH (ref 32.0–48.0)
pH, Arterial: 7.189 — CL (ref 7.350–7.450)
pO2, Arterial: 93 mmHg (ref 83.0–108.0)

## 2019-07-01 LAB — CBC
HCT: 21.7 % — ABNORMAL LOW (ref 39.0–52.0)
HCT: 25.9 % — ABNORMAL LOW (ref 39.0–52.0)
Hemoglobin: 6.9 g/dL — CL (ref 13.0–17.0)
Hemoglobin: 7.9 g/dL — ABNORMAL LOW (ref 13.0–17.0)
MCH: 27.7 pg (ref 26.0–34.0)
MCH: 28.1 pg (ref 26.0–34.0)
MCHC: 31.8 g/dL (ref 30.0–36.0)
MCHC: 30.5 g/dL (ref 30.0–36.0)
MCV: 87.1 fL (ref 80.0–100.0)
MCV: 92.2 fL (ref 80.0–100.0)
Platelets: 31 10*3/uL — ABNORMAL LOW (ref 150–400)
Platelets: 36 10*3/uL — ABNORMAL LOW (ref 150–400)
RBC: 2.49 MIL/uL — ABNORMAL LOW (ref 4.22–5.81)
RBC: 2.81 MIL/uL — ABNORMAL LOW (ref 4.22–5.81)
RDW: 17.2 % — ABNORMAL HIGH (ref 11.5–15.5)
RDW: 17.7 % — ABNORMAL HIGH (ref 11.5–15.5)
WBC: 16.7 10*3/uL — ABNORMAL HIGH (ref 4.0–10.5)
WBC: 19.3 10*3/uL — ABNORMAL HIGH (ref 4.0–10.5)
nRBC: 21.6 % — ABNORMAL HIGH (ref 0.0–0.2)
nRBC: 32.4 % — ABNORMAL HIGH (ref 0.0–0.2)

## 2019-07-01 LAB — HEMOGLOBIN AND HEMATOCRIT, BLOOD
HCT: 24.6 % — ABNORMAL LOW (ref 39.0–52.0)
Hemoglobin: 7.7 g/dL — ABNORMAL LOW (ref 13.0–17.0)

## 2019-07-01 LAB — LACTIC ACID, PLASMA: Lactic Acid, Venous: 6.7 mmol/L (ref 0.5–1.9)

## 2019-07-01 LAB — TROPONIN I (HIGH SENSITIVITY): Troponin I (High Sensitivity): 373 ng/L (ref <18)

## 2019-07-01 LAB — PATHOLOGIST SMEAR REVIEW

## 2019-07-01 LAB — PREPARE RBC (CROSSMATCH)

## 2019-07-01 LAB — MAGNESIUM: Magnesium: 2.6 mg/dL — ABNORMAL HIGH (ref 1.7–2.4)

## 2019-07-01 MED ORDER — ONDANSETRON HCL 4 MG/2ML IJ SOLN: 4.0000 mg | Freq: Four times a day (QID) | INTRAMUSCULAR | Status: DC | PRN

## 2019-07-01 MED ORDER — FENTANYL BOLUS VIA INFUSION
30.0000 ug | INTRAVENOUS | Status: DC | PRN
  Administered 2019-07-01: 30 ug via INTRAVENOUS
  Filled 2019-07-01: qty 30

## 2019-07-01 MED ORDER — HALOPERIDOL 0.5 MG PO TABS: 0.5000 mg | ORAL_TABLET | ORAL | Status: DC | PRN

## 2019-07-01 MED ORDER — EPINEPHRINE 1 MG/10ML IJ SOSY: 0.1000 mg | PREFILLED_SYRINGE | Freq: Once | INTRAMUSCULAR | Status: DC

## 2019-07-01 MED ORDER — HALOPERIDOL LACTATE 5 MG/ML IJ SOLN: 0.5000 mg | INTRAMUSCULAR | Status: DC | PRN

## 2019-07-01 MED ORDER — GLYCOPYRROLATE 0.2 MG/ML IJ SOLN: 0.2000 mg | INTRAMUSCULAR | Status: DC | PRN

## 2019-07-01 MED ORDER — ACETAMINOPHEN 325 MG PO TABS: 650.0000 mg | ORAL_TABLET | Freq: Four times a day (QID) | ORAL | Status: DC | PRN

## 2019-07-01 MED ORDER — HALOPERIDOL LACTATE 2 MG/ML PO CONC
0.5000 mg | ORAL | Status: DC | PRN
  Filled 2019-07-01: qty 0.3

## 2019-07-01 MED ORDER — POLYVINYL ALCOHOL 1.4 % OP SOLN
1.0000 [drp] | Freq: Four times a day (QID) | OPHTHALMIC | Status: DC | PRN
  Filled 2019-07-01: qty 15

## 2019-07-01 MED ORDER — DEXTROSE 50 % IV SOLN: 0.0000 mL | INTRAVENOUS | Status: DC | PRN

## 2019-07-01 MED ORDER — GLYCOPYRROLATE 1 MG PO TABS: 1.0000 mg | ORAL_TABLET | ORAL | Status: DC | PRN

## 2019-07-01 MED ORDER — SODIUM CHLORIDE 0.9% FLUSH: 3.0000 mL | INTRAVENOUS | Status: DC | PRN

## 2019-07-01 MED ORDER — INSULIN REGULAR(HUMAN) IN NACL 100-0.9 UT/100ML-% IV SOLN
INTRAVENOUS | Status: DC
  Administered 2019-07-01: 6 [IU]/h via INTRAVENOUS
  Administered 2019-07-01: 15 [IU]/h via INTRAVENOUS
  Filled 2019-07-01 (×2): qty 100

## 2019-07-01 MED ORDER — ONDANSETRON 4 MG PO TBDP: 4.0000 mg | ORAL_TABLET | Freq: Four times a day (QID) | ORAL | Status: DC | PRN

## 2019-07-01 MED ORDER — SODIUM CHLORIDE 0.9 % IV SOLN: 250.0000 mL | INTRAVENOUS | Status: DC | PRN

## 2019-07-01 MED ORDER — ACETAMINOPHEN 650 MG RE SUPP: 650.0000 mg | Freq: Four times a day (QID) | RECTAL | Status: DC | PRN

## 2019-07-01 MED ORDER — SODIUM ZIRCONIUM CYCLOSILICATE 10 G PO PACK
10.0000 g | PACK | Freq: Once | ORAL | Status: AC
  Administered 2019-07-01: 10 g via ORAL
  Filled 2019-07-01: qty 1

## 2019-07-01 MED ORDER — SODIUM CHLORIDE 0.9 % IV SOLN: 2.0000 g | INTRAVENOUS | Status: DC

## 2019-07-01 MED ORDER — BIOTENE DRY MOUTH MT LIQD: 15.0000 mL | OROMUCOSAL | Status: DC | PRN

## 2019-07-01 MED ORDER — DEXTROSE-NACL 5-0.45 % IV SOLN: INTRAVENOUS | Status: DC

## 2019-07-01 MED ORDER — EPINEPHRINE PF 1 MG/ML IJ SOLN
0.5000 ug/min | INTRAVENOUS | Status: DC
  Filled 2019-07-01: qty 4

## 2019-07-01 MED ORDER — FENTANYL 2500MCG IN NS 250ML (10MCG/ML) PREMIX INFUSION
0.0000 ug/h | INTRAVENOUS | Status: DC
  Administered 2019-07-01: 100 ug/h via INTRAVENOUS
  Filled 2019-07-01: qty 250

## 2019-07-01 MED ORDER — SODIUM CHLORIDE 0.9% IV SOLUTION: Freq: Once | INTRAVENOUS | Status: AC

## 2019-07-01 MED ORDER — SODIUM CHLORIDE 0.9% FLUSH: 3.0000 mL | Freq: Two times a day (BID) | INTRAVENOUS | Status: DC

## 2019-07-02 LAB — TYPE AND SCREEN
ABO/RH(D): O POS
Antibody Screen: NEGATIVE
Unit division: 0

## 2019-07-02 LAB — BPAM RBC
Blood Product Expiration Date: 202102032359
ISSUE DATE / TIME: 202101110556
Unit Type and Rh: 5100

## 2019-07-02 MED FILL — Medication: Qty: 1 | Status: AC

## 2019-07-02 NOTE — Progress Notes (Signed)
200 mls of Fentanyl wasted in the sink with Loma Sousa, Therapist, sports

## 2019-07-04 ENCOUNTER — Telehealth: Payer: Self-pay

## 2019-07-04 NOTE — Telephone Encounter (Signed)
Received a phone call from [place] @ [facility] regarding a dc..  I explained to Mickel Baas that the dc will be signed by Doctor Nelda Marseille who is at [facility] today and explained to her that she would have to ring the doorbell and drop it off as that is a COVID unit.

## 2019-07-22 NOTE — Progress Notes (Signed)
[facility] Progress Note    Assessment/ Plan:   1. 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  Latest Cr was around 3 before the holidays.  Admitted 12/24 w/ creat 3.6 which worsened up to 6-7 range. UA and renal US were negative and pt had to be initiated on CRRT.  D/c'd 06/21/2019 but had to reintiate 1/4 in setting of hyperkalemia following arrest.   Pause on 1/10;  his labs are worsening- no UOP, volume status OK. Will  need to re initiate CRRT in next 24-48 hours if full aggressive care is desired.  Also HD cath in for 12 days which is a factor.   Will give some lokelma for slightly high K to be able to keep him off of CRRT    2. DM2 3. HTN: currently hypotensive on pressors. 4. VRDR: COVID+ PNA, intubated now; 5. Acute bilateral DVT- Coumadin on hold for now d/t bleeding complications 6. Shock: presumed septic, broad spectrum antibiotics per primary 7. CVA: MRI -  neurology following   8. Anemia:   Hb 6.9 this AM- getting blood.  On aranesp 60 qThurs. GI scoped - no interventions, transfuse as needed.  Primary having Doyle discussions with family- ongoing.  Prognosis seems grim.  Remains full code for now.  Subjective:    On NE gtt low dose  I/Os yesterday 4.4 / 4.1L MRI with watershed change as well as other CVA   Objective:   BP (!) 120/49   Pulse 91   Temp 97.8 F (36.6 C) (Oral)   Resp (!) 30   Ht '6\' 1"'  (1.854 m)   Wt 73.6 kg   SpO2 97%   BMI 21.41 kg/m   Intake/Output Summary (Last 24 hours) at 15-Jul-2019 0829 Last data filed at 07-15-19 0740 Gross per 24 hour  Intake 2596.19 ml  Output 172 ml  Net 2424.19 ml   Weight change: 1.1 kg  Physical Exam: Exam deferred due to COVID status -  Right IJ cath in for now 12 days   Imaging: MR BRAIN WO CONTRAST  Result Date: 06/30/2019 CLINICAL DATA:  Anoxic brain damage. History of COVID-19 pneumonia and sepsis with cardiac arrest on 06/24/2019. EXAM: MRI HEAD WITHOUT CONTRAST TECHNIQUE: Multiplanar, multiecho  pulse sequences of the brain and surrounding structures were obtained without intravenous contrast. COMPARISON:  Head CT 06/25/2019 FINDINGS: Brain: A moderate-sized left PICA territory infarct is similar in extent to the prior CT and early subacute in appearance with well-defined cytotoxic edema, persistent mildly restricted diffusion, and at most minimal petechial blood products. There are numerous additional subcentimeter acute to early subacute infarcts in both cerebral hemispheres which primarily involve white matter, greatest at the levels of the centrum semiovale and corona radiata and largely symmetric in distribution. No intracranial mass, midline shift, or extra-axial fluid collection is identified. There is a chronic microhemorrhage in the pons, and there is a chronic hemorrhagic infarct in the right thalamus. Additional chronic lacunar infarcts are noted in the right corona radiata and left thalamus. Patchy T2 hyperintensities elsewhere in the cerebral white matter and pons are nonspecific but compatible with moderately severe chronic small vessel ischemic disease. There is mild cerebral atrophy. Vascular: Major intracranial vascular flow voids are preserved. Skull and upper cervical spine: No suspicious marrow lesion. Sinuses/Orbits: Scattered fluid and mucosal thickening in the paranasal sinuses in the setting of intubation including moderate volume fluid in the left sphenoid sinus. Moderately large bilateral mastoid effusions. Bilateral cataract extraction. Other: None. IMPRESSION: 1. Early subacute  left PICA territory infarct. 2. Numerous subcentimeter acute to early subacute infarcts in both cerebral hemispheres which may reflect watershed ischemia secondary to a hypoperfusion event. 3. 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 Electronically Signed   By: Logan Bores M.D.   On: 06/30/2019 12:41   DG Chest Port 1 View  Result Date: Jul 26, 2019 CLINICAL DATA:  Respiratory failure  EXAM: PORTABLE CHEST 1 VIEW COMPARISON:  Yesterday FINDINGS: Endotracheal tube tip is at the clavicular heads. The feeding tube at least reaches the stomach. Bilateral central line with tips in good position. Unchanged bilateral airspace disease with small left effusion. Normal heart size. No pneumothorax. IMPRESSION: Stable hardware positioning and bilateral pneumonia with small left effusion. Electronically Signed   By: Monte Fantasia M.D.   On: 26-Jul-2019 06:18   DG Chest Port 1 View  Result Date: 06/30/2019 CLINICAL DATA:  Shortness of breath. COVID positive. EXAM: PORTABLE CHEST 1 VIEW COMPARISON:  06/29/2019 FINDINGS: 0339 hours. Endotracheal tube tip is 4.6 cm above the base of the carina. Right central line tip overlies the SVC/RA junction. A feeding tube passes into the stomach although the distal tip position is not included on the film. Diffuse interstitial and patchy alveolar opacity is similar to prior although there appears to be a small left pleural effusion on today's study. The cardio pericardial silhouette is enlarged. The visualized bony structures of the thorax are intact. Telemetry leads overlie the chest. IMPRESSION: 1. Stable exam. Diffuse interstitial and patchy alveolar opacity with small left pleural effusion. Electronically Signed   By: Misty Stanley M.D.   On: 06/30/2019 04:36    Labs: BMET Recent Labs  Lab 06/28/19 0426 06/28/19 1514 06/29/19 0209 06/29/19 0359 06/29/19 1506 06/30/19 0311 06/30/19 2129 2019/07/26 0321  NA 140 137 136 136 137 135 137 137  K 4.1 4.0 4.0 4.2 4.0 4.2 4.8 5.2*  CL 104 104  --  100 101 101 102 102  CO2 26 31  --  '28 27 28 27 28  ' GLUCOSE 109* 81  --  318* 292* 282* 325* 361*  BUN 22 18  --  23 24* 21 42* 50*  CREATININE 1.29* 1.24  --  1.23 1.18 1.08 1.78* 2.10*  CALCIUM 6.6* 6.5*  --  6.6* 6.6* 6.4* 6.7* 6.9*  PHOS 3.0 2.1*  --  1.9* 1.5* 1.3* 1.8* 2.0*   CBC Recent Labs  Lab 06/25/19 0351 06/26/19 0333 06/26/19 1405  06/28/19 0426 06/29/19 0209 06/29/19 0359 06/30/19 0311 07-26-2019 0321  WBC 33.5* 32.0*   < > 31.1*  --  24.0* 21.0* 16.7*  NEUTROABS 30.7* 29.6*  --  28.1*  --   --   --   --   HGB 10.5* 8.9*  --  9.4* 8.2* 7.9* 7.7* 6.9*  HCT 32.3* 26.9*  --  28.5* 24.0* 24.1* 23.4* 21.7*  MCV 83.2 82.8   < > 82.8  --  83.7 84.5 87.1  PLT 103* 60*   < > 33*  --  32* 35* 31*   < > = values in this interval not displayed.    Medications:    . B-complex with vitamin C  1 tablet Per Tube Daily  . chlorhexidine gluconate (MEDLINE KIT)  15 mL Mouth Rinse BID  . Chlorhexidine Gluconate Cloth  6 each Topical q morning - 10a  . darbepoetin (ARANESP) injection - DIALYSIS  60 mcg Intravenous Q Thu-HD  . feeding supplement (PRO-STAT SUGAR FREE 64)  30 mL Per Tube Daily  .  hydrocortisone sod succinate (SOLU-CORTEF) inj  50 mg Intravenous Q6H  . mouth rinse  15 mL Mouth Rinse 10 times per day  . pantoprazole sodium  40 mg Per Tube BID  . rosuvastatin  40 mg Per Tube Daily  . sodium chloride flush  3 mL Intravenous Q12H

## 2019-07-22 NOTE — Progress Notes (Signed)
[facility] Progress Note Patient Name: Steve Martinez DOB: 02-Feb-1941 MRN: [id number]   Date of Service  2019-07-12  HPI/Events of Note  Family requesting transition to comfort measures.  eICU Interventions  Comfort measures orders entered.        Kerry Kass Lavoris Sparling 2019/07/12, 9:26 PM

## 2019-07-22 NOTE — Progress Notes (Signed)
CRITICAL VALUE ALERT  Critical Value:  Hemoglobin 6.9  Date & Time Notied:  Jul 09, 2019 @ [id number]  Provider Notified: Warren Lacy MD notified  Orders Received/Actions taken: awaiting orders

## 2019-07-22 NOTE — Progress Notes (Signed)
eLink Physician-Brief Progress Note Patient Name: Steve Martinez DOB: [birth year] MRN: [id number]   Date of Service  23-Jul-2019  HPI/Events of Note  Code blue  eICU Interventions  Code blue management until ground crew arrived        Frederik Pear 07/23/19, 8:42 PM

## 2019-07-22 NOTE — Procedures (Signed)
Chest Tube Insertion Procedure Note  Indications:  Clinically significant Pneumothorax  Pre-operative Diagnosis: Pneumothorax  Post-operative Diagnosis: Pneumothorax  Procedure Details  Informed consent was obtained for the procedure, including sedation.  Risks of lung perforation, hemorrhage, arrhythmia, and adverse drug reaction were discussed.   After sterile skin prep, using standard technique, a 28 French tube was placed in the left lateral 6th rib space.  Findings: 500 ml of serosanguinous fluid obtained  Estimated Blood Loss:  Minimal         Specimens:  None              Complications:  None; patient tolerated the procedure well.         Disposition: ICU - intubated and critically ill.         Condition: stable

## 2019-07-22 NOTE — Progress Notes (Signed)
Right after shift change around 1915 noted pt O2 sats dropping and hypotensive. Pts BP reading 60/30's with HR in the 70 - 80's. Levophed titrated up and RT called to bedside. Diller also called and made aware of concern for impending code and asked for ground team to come to bedside.  Zoll pads placed on pt and back board placed under pt. Pt eventually lost pulse and was PEA at 1924. Chest compressions started. Please see code sheet.   CCM ground team at bedside and stat labs and orders ordered. CMM speaking to family.

## 2019-07-22 NOTE — Progress Notes (Signed)
Per Dr. Nelda Marseille, titrate levophed to maintain SBP>90

## 2019-07-22 NOTE — Progress Notes (Signed)
Chaplain reported to unit in response to Code Blue.  Dismissed by staff upon entering yellow zone due to Chaplain services not being needed at this time.  Staff will alert if there is future need of Spiritual Care for patient, family or staff  De Burrs Chaplain Resident .

## 2019-07-22 NOTE — Progress Notes (Signed)
Family arrived at bedside and pt was started on Fentanyl drip. After answering all questions and concerns and again confirming with family they wanted to transition to comfort care, all medications stopped except for Fentanyl drip.  Family at bedside at time of death.  Time of death Sep 30, 2243. Information relayed to [facility].  All pt belongings sent with family. They are unsure of a funeral home so pt placement care provided with instructions.  Spoke with [place] donor services and referral number is [phone]. Pt is NOT a suitable candidate for donation per Sarah with [facility].

## 2019-07-22 NOTE — Progress Notes (Signed)
Patient was near death.  Chaplain was requested to be with wife and son of the patient.  Had   22-Jul-2019 2300  Clinical Encounter Type  Visited With Patient and family together  Visit Type Spiritual support;Death  Referral From Nurse  Consult/Referral To Chaplain  Spiritual Encounters  Spiritual Needs Prayer;Emotional   prayer and showed appreciation for staff.

## 2019-07-22 NOTE — Procedures (Signed)
Arterial Catheter Insertion Procedure Note Steve Martinez [id number] 09-02-1940  Procedure: Insertion of Arterial Catheter  Indications: Blood pressure monitoring and Frequent blood sampling  Procedure Details Consent: Unable to obtain consent because of emergent medical necessity. Time Out: Verified patient identification, verified procedure, site/side was marked, verified correct patient position, special equipment/implants available, medications/allergies/relevent history reviewed, required imaging and test results available.  Performed  Maximum sterile technique was used including antiseptics, cap, gloves, gown, hand hygiene, mask and sheet. Skin prep: Iodine solution; local anesthetic administered 22 gauge catheter was inserted into right radial artery using the Seldinger technique. ULTRASOUND GUIDANCE USED: YES Evaluation Blood flow good; BP tracing good. Complications: No apparent complications.   Marissa Nestle July 16, 2019

## 2019-07-22 NOTE — Procedures (Addendum)
CPR note -Called to bedside for bradycardia followed by PEA arrest. Patient underwent two rounds of chest compressions and given epi x 2 and calcium with ROSC per RN.  -RN updated family during active chest compressions and family wished to continue full code -On my evaluation, chest tube with air leak present  Labs, EKG and STAT CXR pending Plan to update family pending results  Rodman Pickle, M.D. [facility] Pulmonary/Critical Care Medicine July 30, 2019 7:44 PM    ADDENDUM: Discussion held with the son regarding goals of care. We reviewed his father's current critical condition including his recent cardiac arrests today. His prognosis is grim and further heroic measures in the event of cardiac or respiratory arrest would unlikely change his outcome. Son wished to change code status to DNR however would like to continue further his own family discussions regarding comfort care/withdrawal of care. He would like his mother and himself to visit before withdrawal of care if they make this decision.  Plan: Change to DNR Possible withdrawal of care pending family tonight  Rodman Pickle, M.D. [facility] Pulmonary/Critical Care Medicine 07-30-19 8:32 PM

## 2019-07-22 NOTE — Procedures (Signed)
CPR note -Called to bedside for dropping sats and pressures -Peak alarms going off on vent -Chest compressions started and epi x 1 -Tracheal deviation and subcutaneous emphysema noted before compressions on left so left 28 French chest tube placed with return of air and pleural fluid  CXR pending

## 2019-07-22 NOTE — Death Summary Note (Signed)
DEATH SUMMARY   Patient Details  Name: Steve Martinez MRN: [id number] DOB: 1940-12-09  Admission/Discharge Information   Admit Date:  2019/06/15  Date of Death: Date of Death: 07-04-19  Time of Death: Time of Death: Jul 31, 2243  Length of Stay: 2022-08-12  Referring Physician: Gaynelle Arabian, MD   Reason(s) for Hospitalization  COVID-19 induced acute hypoxemic respiratory failure  Diagnoses  Preliminary cause of death:   COVID-19 induced acute hypoxemic respiratory failure Secondary Diagnoses (including complications and co-morbidities):  Active Problems:   COVID-19   Type 2 diabetes mellitus with hyperglycemia (HCC)   Acute kidney injury ([place])   Essential hypertension   Hypokalemia   Prolonged QT interval   Hyperlipidemia   Acute on chronic respiratory failure with hypoxia (HCC)   ESRD (end stage renal disease) [facility])   Hypoxia   Brief Hospital Course (including significant findings, care, treatment, and services provided and events leading to death)  79 yo male presented with weakness, dyspnea, hypoxia from COVID 19 pneumonia.  Course complicated by aspiration pneumonia, b/l leg DVT, and AKI.  Transferred to [facility] for iHD.  Developed septic shock and transferred to ICU.  Patient suffered a cardiac arrest after a tension PTX and CT was placed.  However, overnight, suffered another cardiac arrest and was resuscitated.  Discussion with family was such that no further escalation of care and DNR until family arrives for withdrawal.  The family arrived and patient was extubated to comfort to expire shortly thereafter.   Pertinent Labs and Studies  Significant Diagnostic Studies DG Abd 1 View  Result Date: 06/20/2019 CLINICAL DATA:  Feeding tube placement EXAM: ABDOMEN - 1 VIEW COMPARISON:  June 18, 2019 FINDINGS: The tip of the enteric tube projects over the gastric body/fundus. Oral contrast is noted in the gastric lumen. A central venous catheter is noted projecting over the right atrium.  The total fluoroscopy time for the study was 3 minutes and 42 seconds. A total of 10 mL of oral contrast was administered through the tube. IMPRESSION: Enteric tube as above. Electronically Signed   By: Constance Holster M.D.   On: 06/20/2019 15:41   DG Abd 1 View  Result Date: 06/18/2019 CLINICAL DATA:  Gas below syndrome. COVID-19 virus infection. EXAM: ABDOMEN - 1 VIEW COMPARISON:  None. FINDINGS: Mildly dilated small bowel loops are seen. Gas and stool is also seen throughout nondilated colon. These findings are suggestive of a mild ileus. No radiopaque calculi identified. IMPRESSION: Findings most consistent with mild adynamic ileus. Electronically Signed   By: [name] M.D.   On: 06/18/2019 19:04   CT HEAD WO CONTRAST  Result Date: 06/25/2019 CLINICAL DATA:  Altered mental status. COVID 19 positive. EXAM: CT HEAD WITHOUT CONTRAST TECHNIQUE: Contiguous axial images were obtained from the base of the skull through the vertex without intravenous contrast. COMPARISON:  Report of CT head without contrast 07/24/1999. Images are not available. FINDINGS: Brain: Nonhemorrhagic lacunar infarcts are present in the thalami bilaterally. Additional lacunar infarcts are present in the right lentiform nucleus. Moderate generalized atrophy and diffuse white matter disease is present. Remote nonhemorrhagic lacunar infarcts are present in the occipital poles bilaterally. Left PICA territory nonhemorrhagic infarct appears acute/subacute. No acute hemorrhage or mass lesion is present. The ventricles are of normal size. No significant extraaxial fluid collection is present. Prominent ossification is present along the falx. Vascular: Atherosclerotic changes are present within the cavernous internal carotid arteries bilaterally. No significant asymmetric hyperdensity is present. Skull: Calvarium is intact. No focal lytic or  blastic lesions are present. No significant extracranial soft tissue lesion is present.  Sinuses/Orbits: Fluid levels are present in the maxillary sinuses and sphenoid sinuses bilaterally. There is fluid in the nasopharynx. Diffuse mucosal thickening is present throughout the ethmoid air cells. There is fluid in the ethmoid air cells and frontal sinuses bilaterally. The mastoid air cells and middle ear cavities are clear. NG tube enters on the left. The patient is intubated. Bilateral lens replacements are noted. Globes and orbits are otherwise unremarkable. IMPRESSION: 1. Nonhemorrhagic left PICA territory infarct appears acute/subacute. 2. Multiple remote lacunar infarcts within the basal ganglia bilaterally. Infarcts of the thalami are more indeterminate. MRI could be used for further evaluation of infarct acuity. 3. Moderate generalized atrophy and diffuse white matter disease likely reflects the sequela of chronic microvascular ischemia. 4. No acute hemorrhage or mass lesion. These results will be called to the ordering clinician or representative by the Radiologist Assistant, and communication documented in the PACS or zVision Dashboard. Electronically Signed   By: San Morelle M.D.   On: 06/25/2019 11:50   MR BRAIN WO CONTRAST  Result Date: 06/30/2019 CLINICAL DATA:  Anoxic brain damage. History of COVID-19 pneumonia and sepsis with cardiac arrest on 06/24/2019. EXAM: MRI HEAD WITHOUT CONTRAST TECHNIQUE: Multiplanar, multiecho pulse sequences of the brain and surrounding structures were obtained without intravenous contrast. COMPARISON:  Head CT 06/25/2019 FINDINGS: Brain: A moderate-sized left PICA territory infarct is similar in extent to the prior CT and early subacute in appearance with well-defined cytotoxic edema, persistent mildly restricted diffusion, and at most minimal petechial blood products. There are numerous additional subcentimeter acute to early subacute infarcts in both cerebral hemispheres which primarily involve white matter, greatest at the levels of the centrum  semiovale and corona radiata and largely symmetric in distribution. No intracranial mass, midline shift, or extra-axial fluid collection is identified. There is a chronic microhemorrhage in the pons, and there is a chronic hemorrhagic infarct in the right thalamus. Additional chronic lacunar infarcts are noted in the right corona radiata and left thalamus. Patchy T2 hyperintensities elsewhere in the cerebral white matter and pons are nonspecific but compatible with moderately severe chronic small vessel ischemic disease. There is mild cerebral atrophy. Vascular: Major intracranial vascular flow voids are preserved. Skull and upper cervical spine: No suspicious marrow lesion. Sinuses/Orbits: Scattered fluid and mucosal thickening in the paranasal sinuses in the setting of intubation including moderate volume fluid in the left sphenoid sinus. Moderately large bilateral mastoid effusions. Bilateral cataract extraction. Other: None. IMPRESSION: 1. Early subacute left PICA territory infarct. 2. Numerous subcentimeter acute to early subacute infarcts in both cerebral hemispheres which may reflect watershed ischemia secondary to a hypoperfusion event. 3. 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 Electronically Signed   By: Logan Bores M.D.   On: 06/30/2019 12:41   US RENAL  Result Date: 06/15/2019 CLINICAL DATA:  79 year old male COVID-19. Acute renal injury on chronic renal disease. EXAM: RENAL / URINARY TRACT ULTRASOUND COMPLETE COMPARISON:  Renal ultrasound 08/31/2018. FINDINGS: Right Kidney: Renal measurements: 10.0 x 4.9 x 6.1 centimeters = volume: 158 mL (previously 133). Chronically echogenic right kidney with no hydronephrosis or renal mass. Left Kidney: Renal measurements: 9.0 x 5.6 x 3.8 centimeters = volume: 98 mL (previously 153). Stable left renal cortex which also appears mildly echogenic. Cortical thickness seems better preserved in the left kidney. No left hydronephrosis or renal mass.  Bladder: Appears normal for degree of bladder distention. Other: None. IMPRESSION: No acute renal findings and  negative urinary bladder. Chronic medical renal disease suspected. Electronically Signed   By: Genevie Ann M.D.   On: 06/15/2019 08:50   IR Fluoro Guide CV Line Right  Result Date: 06/18/2019 INDICATION: End-stage renal disease. In need durable intravenous access for the initiation of dialysis. Patient is COVID positive. EXAM: TUNNELED CENTRAL VENOUS HEMODIALYSIS CATHETER PLACEMENT WITH ULTRASOUND AND FLUOROSCOPIC GUIDANCE MEDICATIONS: Patient is currently admitted to the hospital receiving intravenous antibiotics. The antibiotic was given in an appropriate time interval prior to skin puncture. ANESTHESIA/SEDATION: None FLUOROSCOPY TIME:  36 seconds (5.6 mGy) COMPLICATIONS: None immediate. PROCEDURE: Informed written consent was obtained from the patient after a discussion of the risks, benefits, and alternatives to treatment. Questions regarding the procedure were encouraged and answered. The right neck and chest were prepped with chlorhexidine in a sterile fashion, and a sterile drape was applied covering the operative field. Maximum barrier sterile technique with sterile gowns and gloves were used for the procedure. A timeout was performed prior to the initiation of the procedure. After creating a small venotomy incision, a micropuncture kit was utilized to access the internal jugular vein. Real-time ultrasound guidance was utilized for vascular access including the acquisition of a permanent ultrasound image documenting patency of the accessed vessel. The microwire was utilized to measure appropriate catheter length. A stiff Glidewire was advanced to the level of the IVC and the micropuncture sheath was exchanged for a peel-away sheath. A palindrome tunneled hemodialysis catheter measuring 19 cm from tip to cuff was tunneled in a retrograde fashion from the anterior chest wall to the venotomy incision.  The catheter was then placed through the peel-away sheath with tips ultimately positioned within the superior aspect of the right atrium. Final catheter positioning was confirmed and documented with a spot radiographic image. The catheter aspirates and flushes normally. The catheter was flushed with appropriate volume heparin dwells. The catheter exit site was secured with a 0-Prolene retention suture. The venotomy incision was closed with Dermabond and Steri-strips. Dressings were applied. The patient tolerated the procedure well without immediate post procedural complication. IMPRESSION: Successful placement of 19 cm tip to cuff tunneled hemodialysis catheter via the right internal jugular vein with tips terminating within the superior aspect of the right atrium. The catheter is ready for immediate use. Electronically Signed   By: [name] M.D.   On: 06/18/2019 17:48   IR US Guide Vasc Access Right  Result Date: 06/18/2019 INDICATION: End-stage renal disease. In need durable intravenous access for the initiation of dialysis. Patient is COVID positive. EXAM: TUNNELED CENTRAL VENOUS HEMODIALYSIS CATHETER PLACEMENT WITH ULTRASOUND AND FLUOROSCOPIC GUIDANCE MEDICATIONS: Patient is currently admitted to the hospital receiving intravenous antibiotics. The antibiotic was given in an appropriate time interval prior to skin puncture. ANESTHESIA/SEDATION: None FLUOROSCOPY TIME:  36 seconds (5.6 mGy) COMPLICATIONS: None immediate. PROCEDURE: Informed written consent was obtained from the patient after a discussion of the risks, benefits, and alternatives to treatment. Questions regarding the procedure were encouraged and answered. The right neck and chest were prepped with chlorhexidine in a sterile fashion, and a sterile drape was applied covering the operative field. Maximum barrier sterile technique with sterile gowns and gloves were used for the procedure. A timeout was performed prior to the initiation of the  procedure. After creating a small venotomy incision, a micropuncture kit was utilized to access the internal jugular vein. Real-time ultrasound guidance was utilized for vascular access including the acquisition of a permanent ultrasound image documenting patency of the accessed vessel. The  microwire was utilized to measure appropriate catheter length. A stiff Glidewire was advanced to the level of the IVC and the micropuncture sheath was exchanged for a peel-away sheath. A palindrome tunneled hemodialysis catheter measuring 19 cm from tip to cuff was tunneled in a retrograde fashion from the anterior chest wall to the venotomy incision. The catheter was then placed through the peel-away sheath with tips ultimately positioned within the superior aspect of the right atrium. Final catheter positioning was confirmed and documented with a spot radiographic image. The catheter aspirates and flushes normally. The catheter was flushed with appropriate volume heparin dwells. The catheter exit site was secured with a 0-Prolene retention suture. The venotomy incision was closed with Dermabond and Steri-strips. Dressings were applied. The patient tolerated the procedure well without immediate post procedural complication. IMPRESSION: Successful placement of 19 cm tip to cuff tunneled hemodialysis catheter via the right internal jugular vein with tips terminating within the superior aspect of the right atrium. The catheter is ready for immediate use. Electronically Signed   By: [name] M.D.   On: 06/18/2019 17:48   DG Chest Port 1 View  Result Date: 2019/07/06 CLINICAL DATA:  79 year old male status post CPR. History of COVID-19 pneumonia with sepsis and cardiac arrest on 06/24/2019. EXAM: PORTABLE CHEST 1 VIEW COMPARISON:  1447 hours today. FINDINGS: Portable AP supine view at 1947 hours. Endotracheal tube tip projects just above the clavicles, stable from earlier. Enteric tube courses to the abdomen, tip not included.  Stable left IJ central line. Stable left chest tube extending to the left lung apex. Stable right chest tunneled appearing central line. Trace left apical pneumothorax is stable. Mediastinal contours are stable and within normal limits. Course bilateral pulmonary opacity and bibasilar veiling opacity appears stable from earlier today. No displaced rib fractures identified. IMPRESSION: 1. Stable lines and tubes. Note that the endotracheal tube tip is just above the clavicles and could be advanced 1 cm for more optimal placement. 2. Stable ventilation from earlier today with trace left pneumothorax and bilateral COVID-19 pneumonia. Possible small bilateral pleural effusions. Electronically Signed   By: Genevie Ann M.D.   On: 07-06-19 20:28   DG CHEST PORT 1 VIEW  Result Date: Jul 06, 2019 CLINICAL DATA:  Decreasing oxygen saturation and blood pressure. EXAM: PORTABLE CHEST 1 VIEW COMPARISON:  Single-view of the chest earlier today. FINDINGS: The patient has a new left chest tube. Tiny left apical pneumothorax is identified. Endotracheal tube, feeding tube and right IJ approach central venous catheter remain in place. Bilateral airspace disease is worse on the right and unchanged. No pleural fluid. Heart size is upper normal. IMPRESSION: New left chest tube in place with a tiny apical pneumothorax identified. No change in right worse than left airspace disease. Electronically Signed   By: Inge Rise M.D.   On: 2019/07/06 15:01   DG Chest Port 1 View  Result Date: 2019-07-06 CLINICAL DATA:  Respiratory failure EXAM: PORTABLE CHEST 1 VIEW COMPARISON:  Yesterday FINDINGS: Endotracheal tube tip is at the clavicular heads. The feeding tube at least reaches the stomach. Bilateral central line with tips in good position. Unchanged bilateral airspace disease with small left effusion. Normal heart size. No pneumothorax. IMPRESSION: Stable hardware positioning and bilateral pneumonia with small left effusion.  Electronically Signed   By: Monte Fantasia M.D.   On: 07-06-19 06:18   DG Chest Port 1 View  Result Date: 06/30/2019 CLINICAL DATA:  Shortness of breath. COVID positive. EXAM: PORTABLE CHEST 1 VIEW COMPARISON:  06/29/2019 FINDINGS: 0339 hours. Endotracheal tube tip is 4.6 cm above the base of the carina. Right central line tip overlies the SVC/RA junction. A feeding tube passes into the stomach although the distal tip position is not included on the film. Diffuse interstitial and patchy alveolar opacity is similar to prior although there appears to be a small left pleural effusion on today's study. The cardio pericardial silhouette is enlarged. The visualized bony structures of the thorax are intact. Telemetry leads overlie the chest. IMPRESSION: 1. Stable exam. Diffuse interstitial and patchy alveolar opacity with small left pleural effusion. Electronically Signed   By: Misty Stanley M.D.   On: 06/30/2019 04:36   DG Chest Port 1 View  Result Date: 06/29/2019 CLINICAL DATA:  Respiratory failure. COVID positive. EXAM: PORTABLE CHEST 1 VIEW COMPARISON:  06/28/2019 FINDINGS: 0525 hours. Cardiopericardial silhouette is at upper limits of normal for size. Patchy asymmetric airspace disease again noted without substantial interval change. No pneumothorax. Endotracheal tube tip is 3.3 cm above the base of the carina right sided central line tip overlies the distal SVC level. A feeding tube passes into the stomach although the distal tip position is not included on the film. Left IJ central line tip overlies the distal SVC. Telemetry leads overlie the chest. IMPRESSION: 1. No substantial interval change in exam. Patchy asymmetric airspace disease. 2. Stable support apparatus. No evidence for pneumothorax. Electronically Signed   By: Misty Stanley M.D.   On: 06/29/2019 08:46   DG CHEST PORT 1 VIEW  Result Date: 06/28/2019 CLINICAL DATA:  COVID 19 infection EXAM: PORTABLE CHEST 1 VIEW COMPARISON:  06/25/2019  FINDINGS: Endotracheal tube tip has been advanced slightly and is at the level of the clavicular heads. The right IJ approach central venous catheter is in unchanged position. There is slightly improved aeration at the left lung base with mild residual opacity. Opacities in the upper right lung are unchanged. IMPRESSION: Slightly improved aeration of the left lung. Otherwise unchanged multifocal infection. Endotracheal tube tip at the level of the clavicular heads. Electronically Signed   By: Ulyses Jarred M.D.   On: 06/28/2019 02:22   DG CHEST PORT 1 VIEW  Result Date: 06/25/2019 CLINICAL DATA:  Pneumonia, COVID-19 positive EXAM: PORTABLE CHEST 1 VIEW COMPARISON:  Radiograph 06/24/2019 FINDINGS: *Endotracheal tube in the mid trachea, 4 cm from the carina. *Transesophageal tube tip beyond the GE junction, below the level of imaging. *Left IJ per approach catheter tip terminates at the brachiocephalic-caval confluence. *Dual lumen right IJ approach dialysis catheter tip terminates near the right atrium. *Additional telemetry leads and support devices overlie the chest. Increasingly coalescent opacity present in the left lung base. Obscuration of the left hemidiaphragm likely reflects left effusion as well. Persistent airspace disease throughout the right lung is similar to comparison study. No visible right effusion. No pneumothorax. Cardiomediastinal contours are stable. No acute osseous or soft tissue abnormality. IMPRESSION: 1. Increasingly coalescent opacity in the left lung base. 2. Stable right lung airspace disease. 3. Stable satisfactory appearance of the support devices. Electronically Signed   By: Lovena Le M.D.   On: 06/25/2019 06:27   DG CHEST PORT 1 VIEW  Result Date: 06/24/2019 CLINICAL DATA:  Status post central line placement today. EXAM: PORTABLE CHEST 1 VIEW COMPARISON:  Single-view of the chest earlier today. FINDINGS: The patient has a new left IJ approach central venous catheter with the  tip in the lower superior vena cava. Support apparatus is otherwise unchanged. No pneumothorax. Right worse than  left airspace disease and small pleural effusions are also unchanged. IMPRESSION: Tip of new left IJ catheter projects in the lower superior vena cava. No pneumothorax or other change since the exam earlier today. Electronically Signed   By: Inge Rise M.D.   On: 06/24/2019 09:40   DG CHEST PORT 1 VIEW  Result Date: 06/24/2019 CLINICAL DATA:  Hypoxia. EXAM: PORTABLE CHEST 1 VIEW COMPARISON:  June 17, 2019 FINDINGS: Endotracheal tube tip is 3.8 cm above the carina. Feeding tube tip is in the stomach. Central catheter tip is at the cavoatrial junction. No pneumothorax. There is a right pleural effusion. There is bibasilar atelectatic change. There is less patchy airspace opacity bilaterally compared to the previous study with residual patchy airspace opacity each upper lobe and left lower lobe. Heart is upper normal in size with pulmonary vascularity normal. There is aortic atherosclerosis. No bone lesions. IMPRESSION: Tube and catheter positions as described without evident pneumothorax. Partial but incomplete clearing of airspace opacity bilaterally. Residual airspace opacity noted in each upper lobe and left lower lobe with areas of atelectatic change in the lower lung zones as well. No evident new opacity. Small right pleural effusion evident. Stable cardiac silhouette. Aortic Atherosclerosis (ICD10-I70.0). Electronically Signed   By: Lowella Grip III M.D.   On: 06/24/2019 07:56   CXR am  Result Date: 06/17/2019 CLINICAL DATA:  Shortness of breath. EXAM: PORTABLE CHEST 1 VIEW COMPARISON:  Chest radiograph 06/16/2019 FINDINGS: Stable cardiomediastinal silhouette. Overlying cardiac monitoring leads. Ill-defined opacity throughout both lungs appears progressed from prior examination. No sizable pleural effusion or evidence of pneumothorax. Nonspecific air distention of nondescript  bowel loops within the partially imaged upper abdomen. IMPRESSION: Interval increase in ill-defined opacity throughout both lungs, likely reflecting multifocal pneumonia. Electronically Signed   By: Kellie Simmering DO   On: 06/17/2019 08:43   CXR am  Result Date: 06/16/2019 CLINICAL DATA:  Shortness of breath. EXAM: PORTABLE CHEST 1 VIEW COMPARISON:  06/02/2019 FINDINGS: 1410 hours. Interval progression of patchy ill-defined ground-glass airspace opacities bilaterally. Stable asymmetric elevation left hemidiaphragm. The cardiopericardial silhouette is within normal limits for size. The visualized bony structures of the thorax are intact. Telemetry leads overlie the chest. IMPRESSION: The hazy ill-defined bilateral patchy airspace opacity appears progressed in the mid lungs bilaterally. Electronically Signed   By: Misty Stanley M.D.   On: 06/16/2019 14:20   DG Chest Port 1 View  Result Date: 06/08/2019 CLINICAL DATA:  Cough.  Shortness of breath.  Fever. EXAM: PORTABLE CHEST 1 VIEW COMPARISON:  Chest x-ray 02/03/2012, 06/16/2005. FINDINGS: Mediastinum hilar structures normal. Heart size normal. Diffuse bilateral interstitial prominence. Pneumonitis could present this fashion. Bibasilar atelectasis with right base alveolar infiltrate. No prominent pleural effusion or pneumothorax. Stable elevation left hemidiaphragm. IMPRESSION: 1. Diffuse bilateral interstitial prominence. Pneumonitis could present this fashion. Bibasilar atelectasis with right base alveolar infiltrate. 2.  Chronic elevation left hemidiaphragm. Electronically Signed   By: Marcello Moores  Register   On: 05/25/2019 13:32   DG Abd Portable 1V  Result Date: 06/25/2019 CLINICAL DATA:  Feeding tube placement. EXAM: PORTABLE ABDOMEN - 1 VIEW COMPARISON:  Chest x-ray 06/25/2019, 06/24/2019. Abdomen 06/22/2019. FINDINGS: Left and right IJ line stable position. Feeding tube noted with tip over the left upper quadrant. No prominent bowel distention.  Persistent bibasilar infiltrates. Lucencies noted over the right upper quadrant, right lower chest is most likely related to normal lung with adjacent infiltrates. Similar findings noted on prior exam. IMPRESSION: Feeding tube noted with tip over the left  upper quadrant. Electronically Signed   By: Marcello Moores  Register   On: 06/25/2019 13:05   DG Abd Portable 1V  Result Date: 06/22/2019 CLINICAL DATA:  Ileus.  COVID-19 positive. EXAM: PORTABLE ABDOMEN - 1 VIEW COMPARISON:  One day prior and back to 06/18/2019. FINDINGS: Single supine view of the abdomen and pelvis. Resolution of previously described gas filled small bowel loops. Bowel gas pattern currently within normal limits. No abnormal abdominal calcifications. No appendicolith. Distal gas. IMPRESSION: No acute findings.  Resolved adynamic ileus. Electronically Signed   By: Abigail Miyamoto M.D.   On: 06/22/2019 12:46   DG Naso G Tube Plc W/Fl-No Rad  Result Date: 06/27/2019 There is no Radiologist interpretation  for this exam.  Overnight EEG with video  Result Date: 06/25/2019 Lora Havens, MD     06/25/2019 11:29 AM Patient Name: Kyros Salzwedel MRN: [id number] Epilepsy Attending: Lora Havens Referring Physician/Provider: Dr Marshell Garfinkel Duration: 06/24/2019 1627 to 06/25/2019 1022 Patient history: 79 year old with hypertension, diabetes, CVA, CKD admitted with COVID-19 pneumonia, had cardiorespiratory arrest Level of alertness: comatose AEDs during EEG study: None Technical aspects: This EEG study was done with scalp electrodes positioned according to the 10-20 International system of electrode placement. Electrical activity was acquired at a sampling rate of _0  and reviewed with a high frequency filter of _1  and a low frequency filter of _2 . EEG data were recorded continuously and digitally stored. DESCRIPTION: EEG showed continuous generalized high amplitude rhythmic 3-_3  delta slowing, at times with triphasic morphology. Hyperventilation and  photic stimulation were not performed. Of note, this was a technically difficult study due to significant myogenic artifact. ABNORMALITY - Continuous rhythmic slow, generalized IMPRESSION: This technically difficult study is suggestive of severe diffuse encephalopathy, non specific to etiology. No seizures or epileptiform discharges were seen throughout the recording. Lora Havens   ECHOCARDIOGRAM COMPLETE  Result Date: 06/16/2019   ECHOCARDIOGRAM REPORT   Patient Name:   Roddrick Schuyler Date of Exam: 06/16/2019 Medical Rec #:  [id number]   Height:       73.0 in Accession #:    [id number]  Weight:       170.0 lb Date of Birth:  31-Dec-1940   BSA:          2.01 m Patient Age:    25 years    BP:           144/79 mmHg Patient Gender: M           HR:           106 bpm. Exam Location:  Inpatient Procedure: 2D Echo STAT ECHO Indications:    Chest Pain 786.50 / R07.9  History:        Patient has no prior history of Echocardiogram examinations.                 Signs/Symptoms:Shortness of Breath; Risk Factors:Hypertension,                 Dyslipidemia and Diabetes. CVA. COVID-19.  Sonographer:    Clayton Lefort RDCS (AE) Referring Phys: [id number] Margaree Mackintosh [facility]  Sonographer Comments: Echocardiogram performed at [facility]. IMPRESSIONS  1. Left ventricular ejection fraction, by visual estimation, is >75%. The left ventricle has hyperdynamic function. There is no left ventricular hypertrophy.  2. Left ventricular diastolic parameters are normal for age. The "impaired relaxation" pattern of mitral inflow is probably due to low left atrial pressures ("underfilling") and increased cardiac output.  3. The left ventricle  has no regional wall motion abnormalities.  4. Global right ventricle has hyperdynamic systolic function.The right ventricular size is normal. No increase in right ventricular wall thickness.  5. Left atrial size was normal.  6. Right atrial size was normal.  7. The mitral valve is normal in structure. No  evidence of mitral valve regurgitation.  8. The tricuspid valve is normal in structure.  9. The aortic valve is normal in structure. Aortic valve regurgitation is not visualized. Mild aortic valve sclerosis without stenosis. 10. The pulmonic valve was grossly normal. Pulmonic valve regurgitation is not visualized. 11. Normal pulmonary artery systolic pressure. 12. The inferior vena cava is collapsed, consistent with low right atrial pressure. FINDINGS  Left Ventricle: Left ventricular ejection fraction, by visual estimation, is >75%. The left ventricle has hyperdynamic function. The left ventricle has no regional wall motion abnormalities. There is no left ventricular hypertrophy. Left ventricular diastolic parameters are consistent with Grade I diastolic dysfunction (impaired relaxation). Normal left atrial pressure. Right Ventricle: The right ventricular size is normal. No increase in right ventricular wall thickness. Global RV systolic function is has hyperdynamic systolic function. The tricuspid regurgitant velocity is 2.58 m/s, and with an assumed right atrial pressure of 0 mmHg, the estimated right ventricular systolic pressure is normal at 26.6 mmHg. Left Atrium: Left atrial size was normal in size. Right Atrium: Right atrial size was normal in size Pericardium: There is no evidence of pericardial effusion. Mitral Valve: The mitral valve is normal in structure. No evidence of mitral valve regurgitation. MV peak gradient, 11.4 mmHg. Tricuspid Valve: The tricuspid valve is normal in structure. Tricuspid valve regurgitation is not demonstrated. Aortic Valve: The aortic valve is normal in structure. Aortic valve regurgitation is not visualized. Mild aortic valve sclerosis is present, with no evidence of aortic valve stenosis. Aortic valve mean gradient measures 6.0 mmHg. Aortic valve peak gradient measures 10.9 mmHg. Aortic valve area, by VTI measures 2.66 cm. Pulmonic Valve: The pulmonic valve was grossly  normal. Pulmonic valve regurgitation is not visualized. Pulmonic regurgitation is not visualized. Aorta: The aortic root is normal in size and structure. Venous: The inferior vena cava is collapsed, consistent with low right atrial pressure. IAS/Shunts: No atrial level shunt detected by color flow Doppler.  LEFT VENTRICLE PLAX 2D LVIDd:         3.51 cm  Diastology LVIDs:         2.08 cm  LV e' lateral:   12.00 cm/s LV PW:         1.18 cm  LV E/e' lateral: 9.6 LV IVS:        1.15 cm  LV e' medial:    8.16 cm/s LVOT diam:     2.00 cm  LV E/e' medial:  14.1 LV SV:         37 ml LV SV Index:   18.67 LVOT Area:     3.14 cm  RIGHT VENTRICLE             IVC RV Basal diam:  3.43 cm     IVC diam: 1.23 cm RV S prime:     16.80 cm/s TAPSE (M-mode): 3.1 cm LEFT ATRIUM             Index       RIGHT ATRIUM           Index LA diam:        3.20 cm 1.59 cm/m  RA Area:     18.90 cm LA  Vol [facility]):   65.2 ml 32.47 ml/m RA Volume:   55.30 ml  27.54 ml/m LA Vol (A4C):   57.3 ml 28.54 ml/m LA Biplane Vol: 64.0 ml 31.87 ml/m  AORTIC VALVE AV Area (Vmax):    2.59 cm AV Area (Vmean):   2.70 cm AV Area (VTI):     2.66 cm AV Vmax:           165.00 cm/s AV Vmean:          113.000 cm/s AV VTI:            0.290 m AV Peak Grad:      10.9 mmHg AV Mean Grad:      6.0 mmHg LVOT Vmax:         136.00 cm/s LVOT Vmean:        97.267 cm/s LVOT VTI:          0.246 m LVOT/AV VTI ratio: 0.85  AORTA Ao Root diam: 2.80 cm MITRAL VALVE                         TRICUSPID VALVE MV Area (PHT): 3.08 cm              TR Peak grad:   26.6 mmHg MV Peak grad:  11.4 mmHg             TR Vmax:        258.00 cm/s MV Mean grad:  5.0 mmHg MV Vmax:       1.69 m/s              SHUNTS MV Vmean:      105.0 cm/s            Systemic VTI:  0.25 m MV VTI:        0.32 m                Systemic Diam: 2.00 cm MV PHT:        71.34 msec MV Decel Time: 246 msec MV E velocity: 115.00 cm/s 103 cm/s MV A velocity: 170.00 cm/s 70.3 cm/s MV E/A ratio:  0.68        1.5  Mihai Croitoru  MD Electronically signed by Sanda Klein MD Signature Date/Time: 06/16/2019/3:34:03 PM    Final    VAS US CAROTID  Result Date: 06/27/2019 Carotid Arterial Duplex Study Indications:       CVA. Other Factors:     COVID 19 positive. Limitations        Today's exam was limited due to a central line. Comparison Study:  No prior studies. Performing Technologist: Oliver Hum RVT  Examination Guidelines: A complete evaluation includes B-mode imaging, spectral Doppler, color Doppler, and power Doppler as needed of all accessible portions of each vessel. Bilateral testing is considered an integral part of a complete examination. Limited examinations for reoccurring indications may be performed as noted.  Right Carotid Findings: +----------+--------+--------+--------+-----------------------+--------+           PSV cm/sEDV cm/sStenosisPlaque Description     Comments +----------+--------+--------+--------+-----------------------+--------+ CCA Prox  128     16              smooth and heterogenous         +----------+--------+--------+--------+-----------------------+--------+ CCA Distal74      10              smooth and heterogenous         +----------+--------+--------+--------+-----------------------+--------+ ICA Prox  84  20              smooth and heterogenous         +----------+--------+--------+--------+-----------------------+--------+ ICA Distal102     34                                              +----------+--------+--------+--------+-----------------------+--------+ ECA       80      10                                              +----------+--------+--------+--------+-----------------------+--------+ +----------+--------+-------+--------+-------------------+           PSV cm/sEDV cmsDescribeArm Pressure (mmHG) +----------+--------+-------+--------+-------------------+ Subclavian132                                         +----------+--------+-------+--------+-------------------+ +---------+--------+--+--------+--+---------+ VertebralPSV cm/s42EDV cm/s13Antegrade +---------+--------+--+--------+--+---------+  Left Carotid Findings: +----------+--------+--------+--------+------------------+---------------------+           PSV cm/sEDV cm/sStenosisPlaque DescriptionComments              +----------+--------+--------+--------+------------------+---------------------+ CCA Prox                                            Proximal CCA is not                                                       visualized.           +----------+--------+--------+--------+------------------+---------------------+ CCA Distal                                          Not visualized        +----------+--------+--------+--------+------------------+---------------------+ ICA Prox                                            Not visualized        +----------+--------+--------+--------+------------------+---------------------+ ICA Distal                                          Not visualized        +----------+--------+--------+--------+------------------+---------------------+ ECA                                                 Not visualized        +----------+--------+--------+--------+------------------+---------------------+ +----------+--------+--------+--------+-------------------+           PSV cm/sEDV cm/sDescribeArm Pressure (mmHG) +----------+--------+--------+--------+-------------------+ [id number]                                         +----------+--------+--------+--------+-------------------+ +---------+--------+--------+--------------+  [place] cm/sEDV cm/sNot identified +---------+--------+--------+--------------+  Summary: Right Carotid: Velocities in the right ICA are consistent with a 1-39% stenosis. Left Carotid: Unable to obtain left sided images due to central line  and               bandages. Vertebrals: Right vertebral artery demonstrates antegrade flow. *See table(s) above for measurements and observations.  Electronically signed by Antony Contras MD on 06/27/2019 at 8:09:47 AM.    Final    Leg [place]  Result Date: 06/13/2019  Lower Venous Study Indications: Elevated Ddimer.  Risk Factors: COVID 19 positive. Comparison Study: No prior studies. Performing Technologist: Oliver Hum RVT  Examination Guidelines: A complete evaluation includes B-mode imaging, spectral Doppler, color Doppler, and power Doppler as needed of all accessible portions of each vessel. Bilateral testing is considered an integral part of a complete examination. Limited examinations for reoccurring indications may be performed as noted.  +---------+---------------+---------+-----------+----------+--------------+ RIGHT    CompressibilityPhasicitySpontaneityPropertiesThrombus Aging +---------+---------------+---------+-----------+----------+--------------+ CFV      Full           Yes      Yes                                 +---------+---------------+---------+-----------+----------+--------------+ SFJ      Full                                                        +---------+---------------+---------+-----------+----------+--------------+ FV Prox  Full                                                        +---------+---------------+---------+-----------+----------+--------------+ FV Mid   Full                                                        +---------+---------------+---------+-----------+----------+--------------+ FV DistalFull                                                        +---------+---------------+---------+-----------+----------+--------------+ PFV      Full                                                        +---------+---------------+---------+-----------+----------+--------------+ POP      Full           Yes      Yes                                  +---------+---------------+---------+-----------+----------+--------------+ PTV      None  Acute          +---------+---------------+---------+-----------+----------+--------------+ PERO     Full                                                        +---------+---------------+---------+-----------+----------+--------------+   +---------+---------------+---------+-----------+----------+--------------+ LEFT     CompressibilityPhasicitySpontaneityPropertiesThrombus Aging +---------+---------------+---------+-----------+----------+--------------+ CFV      Full           Yes      Yes                                 +---------+---------------+---------+-----------+----------+--------------+ SFJ      Full                                                        +---------+---------------+---------+-----------+----------+--------------+ FV Prox  Full                                                        +---------+---------------+---------+-----------+----------+--------------+ FV Mid   Full                                                        +---------+---------------+---------+-----------+----------+--------------+ FV DistalFull                                                        +---------+---------------+---------+-----------+----------+--------------+ PFV      Full                                                        +---------+---------------+---------+-----------+----------+--------------+ POP      Full           Yes      Yes                                 +---------+---------------+---------+-----------+----------+--------------+ PTV      Full                                                        +---------+---------------+---------+-----------+----------+--------------+ PERO     Full                                                         +---------+---------------+---------+-----------+----------+--------------+  SSV      None                                         Acute          +---------+---------------+---------+-----------+----------+--------------+     Summary: Right: Findings consistent with acute deep vein thrombosis involving the right peroneal veins. No cystic structure found in the popliteal fossa. Left: Findings consistent with acute superficial vein thrombosis involving the left small saphenous vein. No cystic structure found in the popliteal fossa.  *See table(s) above for measurements and observations. Electronically signed by Monica Martinez MD on 06/13/2019 at 5:23:13 PM.    Final    ECHOCARDIOGRAM LIMITED  Result Date: 06/26/2019   ECHOCARDIOGRAM LIMITED REPORT   Patient Name:   Achillies Suto Date of Exam: 06/26/2019 Medical Rec #:  [id number]   Height:       73.0 in Accession #:    [id number]  Weight:       157.6 lb Date of Birth:  [birth year]   BSA:          1.94 m Patient Age:    79 years    BP:           124/50 mmHg Patient Gender: M           HR:           82 bpm. Exam Location:  Inpatient  Procedure: Limited Echo, Cardiac Doppler and Limited Color Doppler Indications:    Stroke 434.91 / I163.9  History:        Patient has prior history of Echocardiogram examinations, most                 recent 06/16/2019. COVID 19. GERD. Acute hypoxic respiratory                 failure. 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  Sonographer:    Darlina Sicilian RDCS Referring Phys: [id number] [place]  Sonographer Comments: Technically difficult study due to poor echo windows, suboptimal parasternal window, suboptimal apical window and echo performed with patient supine and on artificial respirator. Image acquisition challenging due to respiratory motion. IMPRESSIONS  1. Technically difficult study, very limited images  2. Left ventricular ejection fraction, by visual estimation, is 65 to 70%. The left ventricle has hyperdynamic function.  3.  Global right ventricle has grossly normal size and systolic function. FINDINGS  Left Ventricle: Left ventricular ejection fraction, by visual estimation, is 65 to 70%. The left ventricle has hyperdynamic function. Right Ventricle: The right ventricular size is normal. Global RV systolic function is has normal systolic function. The tricuspid regurgitant velocity is 2.47 m/s, and with an assumed right atrial pressure of 8 mmHg, the estimated right ventricular systolic pressure is mildly elevated at 32.4 mmHg. Mitral Valve: MV Area by PHT, 2.00 cm. MV PHT, 110.20 msec. No evidence of mitral valve regurgitation. Tricuspid Valve: Tricuspid valve regurgitation is trivial. Aortic Valve: The aortic valve is abnormal. . There is moderate thickening and moderate calcification of the aortic valve. There is moderate thickening of the aortic valve. There is moderate calcification of the aortic valve.   Diastology                 Normals LV e' lateral:   3.70 cm/s 6.42 cm/s LV E/e' lateral: 13.7      15.4 LV e' medial:  4.35 cm/s 6.96 cm/s LV E/e' medial:  11.6      6.96  MITRAL VALVE               Normals  TRICUSPID VALVE             Normals MV Area (PHT): 2.00 cm             TR Peak grad:   24.4 mmHg MV PHT:        110.20 msec 55 ms    TR Vmax:        247.00 cm/s 288 cm/s MV Decel Time: 380 msec    187 ms MV E velocity: 50.60 cm/s 103 cm/s MV A velocity: 37.30 cm/s 70.3 cm/s MV E/A ratio:  1.36       1.5  Oswaldo Milian MD Electronically signed by Oswaldo Milian MD Signature Date/Time: 06/26/2019/4:34:06 PM    Final     Microbiology Recent Results (from the past 240 hour(s))  Urine Culture     Status: Abnormal   Collection Time: 06/25/19  9:08 AM   Specimen: Urine, Catheterized  Result Value Ref Range Status   Specimen Description URINE, CATHETERIZED  Final   Special Requests   Final    Normal Performed at [facility], [address] [place], [place] [address]    Culture >=100,000  COLONIES/mL YEAST (A)  Final   Report Status 06/26/2019 FINAL  Final  Culture, blood (routine x 2)     Status: None   Collection Time: 06/25/19 10:22 AM   Specimen: BLOOD LEFT HAND  Result Value Ref Range Status   Specimen Description BLOOD LEFT HAND  Final   Special Requests   Final    BOTTLES DRAWN AEROBIC ONLY Blood Culture results may not be optimal due to an inadequate volume of blood received in culture bottles   Culture   Final    NO GROWTH 5 DAYS Performed at [facility], [place] [address], [place], [place] [address]    Report Status 06/30/2019 FINAL  Final  Culture, blood (routine x 2)     Status: None   Collection Time: 06/25/19 10:25 AM   Specimen: BLOOD  Result Value Ref Range Status   Specimen Description BLOOD RIGHT ANTECUBITAL  Final   Special Requests   Final    BOTTLES DRAWN AEROBIC AND ANAEROBIC Blood Culture results may not be optimal due to an inadequate volume of blood received in culture bottles   Culture   Final    NO GROWTH 5 DAYS Performed at [facility], [place] [address], [place], [place] [address]    Report Status 06/30/2019 FINAL  Final  Culture, respiratory (non-expectorated)     Status: None   Collection Time: 06/25/19 11:00 AM   Specimen: Tracheal Aspirate; Respiratory  Result Value Ref Range Status   Specimen Description TRACHEAL ASPIRATE  Final   Special Requests Normal  Final   Gram Stain   Final    RARE WBC PRESENT, PREDOMINANTLY PMN NO ORGANISMS SEEN Performed at [facility], [place] [address] [place], [place] [address]    Culture FEW CANDIDA ALBICANS  Final   Report Status 06/27/2019 FINAL  Final    Lab Basic Metabolic Panel: Recent Labs  Lab 06/27/19 0417 06/27/19 1212 06/28/19 0426 06/29/19 0359 06/29/19 1506 06/30/19 0311 06/30/19 2129 July 12, 2019 0321 2019/07/12 1558 July 12, 2019 1949 2019-07-12 2008  NA 138  --  140 136 137 135 137 137 138 141 139  K 4.3  --  4.1 4.2 4.0 4.2 4.8 5.2* 5.2* 5.3* 5.0  CL 103   < > 104  100 101 101 102 102 101 105  --   CO2 23   < > _0 --   GLUCOSE 289*   < > 109* 318* 292* 282* 325* 361* 208* 198*  --   BUN 35*   < > 22 23 24* 21 42* 50* 66* 68*  --   CREATININE 1.69*   < > 1.29* 1.23 1.18 1.08 1.78* 2.10* 2.54* 2.77*  --   CALCIUM 6.7*   < > 6.6* 6.6* 6.6* 6.4* 6.7* 6.9* 7.0* 9.0  --   MG 2.7*  --  2.4 2.3  --  2.3  --  2.6*  --   --   --   PHOS 2.9   < > 3.0 1.9* 1.5* 1.3* 1.8* 2.0* 3.1  --   --    < > = values in this interval not displayed.   Liver Function Tests: Recent Labs  Lab 06/30/19 0311 06/30/19 2129 Jul 15, 2019 0321 15-Jul-2019 1558 2019/07/15 1949  AST  --   --   --   --  366*  ALT  --   --   --   --  108*  ALKPHOS  --   --   --   --  222*  BILITOT  --   --   --   --  0.6  PROT  --   --   --   --  3.5*  ALBUMIN 1.7* 1.6* 1.5* 1.6* 1.3*   No results for input(s): LIPASE, AMYLASE in the last 168 hours. No results for input(s): AMMONIA in the last 168 hours. CBC: Recent Labs  Lab 06/26/19 0333 06/26/19 1405 06/28/19 0426 06/29/19 0359 06/30/19 0311 07/15/19 0321 07/15/2019 0927 2019/07/15 1949 07/15/2019 2008  WBC 32.0*   < > 31.1* 24.0* 21.0* 16.7*  --  19.3*  --   NEUTROABS 29.6*  --  28.1*  --   --   --   --   --   --   HGB 8.9*  --  9.4* 7.9* 7.7* 6.9* 7.7* 7.9* 7.5*  HCT 26.9*  --  28.5* 24.1* 23.4* 21.7* 24.6* 25.9* 22.0*  MCV 82.8   < > 82.8 83.7 84.5 87.1  --  92.2  --   PLT 60*   < > 33* 32* 35* 31*  --  36*  --    < > = values in this interval not displayed.   Cardiac Enzymes: No results for input(s): CKTOTAL, CKMB, CKMBINDEX, TROPONINI in the last 168 hours. Sepsis Labs: Recent Labs  Lab 06/25/19 0908 06/26/19 0333 06/27/19 0417 06/28/19 0432 06/28/19 0758 06/29/19 0359 06/30/19 0311 15-Jul-2019 0321 15-Jul-2019 1949  PROCALCITON 1.37 1.22 1.32  --   --   --   --   --   --   WBC  --  32.0*  --   --   --  24.0* 21.0* 16.7* 19.3*  LATICACIDVEN  --   --   --  3.4* 2.6*  --   --   --  6.7*     Procedures/Operations     Jennet Maduro 07/20/2019, 7:37 AM

## 2019-07-22 NOTE — Progress Notes (Signed)
NEUROLOGY PROGRESS NOTE  Brief history: This is a 79 year old male,with significant past medical history of hypertension, hyperlipidemia, type 2 diabetes mellitus, prior stroke with residual left-sided weakness who initially presented to the hospital on 06/18/2019 with progressively worsening shortness of breath.  He was diagnosed with COVID-19 and started on therapy including remdesivir, dexamethasone, antibiotics as needed.  He was also found to be in renal failure and started on CRRT.  Patient gradually improved and was transferred to internal medicine floor on 06/23/2019.  However on 06/24/2019 at around 7 AM patient was found to be unresponsive, CODE BLUE was initiated and patient received CPR for 7 minutes.  He was then transferred back to ICU.  As part of encephalopathy work-up, patient had LTM EEG for 24 hours which did not show any ictal-interictal abnormality.    Therefore MRI brain noncontrast was performed today which showed an early subacute nonhemorrhagic left PICA territory infarct and multiple subcentimeter bilateral deep white matter subacute ischemic infarctions.   MRI brain: 1. Early subacute left PICA territory infarct. 2. Numerous subcentimeter acute to early subacute infarcts in both cerebral hemispheres which may reflect watershed ischemia secondary to a hypoperfusion event. 3. 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  Subjective: Patient currently intubated.  Flaccid throughout with no response to pain  Exam: Vitals:   2019/07/31 0900 07-31-19 1127  BP: (!) 115/50   Pulse: 90 89  Resp: (!) 32 (!) 26  Temp:    SpO2: 94% 100%    ROS Unable to obtain secondary to patient being intubated  Physical Exam  Constitutional: Appears well-developed and well-nourished.  Eyes: No scleral injection HENT: Intubated Head: Normocephalic.  Cardiovascular: Normal rate and regular rhythm.  Respiratory: Effort normal, non-labored breathing GI: Soft.  No distension.  There is no tenderness.  Skin: WDI   Neuro:  Exam: Current vital signs: BP (!) 115/50   Pulse 89   Temp 97.8 F (36.6 C) (Oral)   Resp (!) 26   Ht '6\' 1"'  (1.854 m)   Wt 73.6 kg   SpO2 100%   BMI 21.41 kg/m  Vital signs in last 24 hours: Temp:  [97.3 F (36.3 C)-98 F (36.7 C)] 97.8 F (36.6 C) (01/11 0740) Pulse Rate:  [77-91] 89 (01/11 1127) Resp:  [22-33] 26 (01/11 1127) BP: (86-129)/(44-57) 115/50 (01/11 0900) SpO2:  [86 %-100 %] 100 % (01/11 1127) Arterial Line BP: (107-171)/(34-48) 148/38 (01/11 0900) FiO2 (%):  [50 %-60 %] 50 % (01/11 1127) Weight:  [73.6 kg] 73.6 kg (01/11 0449)   Constitutional: Appears well-developed and well-nourished.  Eyes: No scleral injection HENT: Intubated Head: Normocephalic.  Cardiovascular: Normal rate   Respiratory: Intubated GI: Soft. No distension. There is no tenderness.  Skin: WDI  Mental Status: Patient does not respond to verbal stimuli.  Does not respond to deep sternal rub. Does not follow commands.  Intubated. Cranial Nerves: II: Patient does not respond confrontation bilaterally. Pupils right 2 mm, left 2 mm and sluggishly reactive bilaterally III,IV,VI: Doll's response present with roving eyes bilaterally.  V,VII: corneal reflex present bilaterally  VIII: patient does not respond to verbal stimuli IX,X: gag reflex present XI: trapezius strength unable to test bilaterally XII: tongue strength unable to test Motor: Extremities flaccid throughout.  No spontaneous movement noted.  No purposeful or non-purposeful movements noted to stimulation. Sensory: Does not respond to noxious stimuli in any extremity. Deep Tendon Reflexes:  Absent throughout. Plantars: Mute bilaterally Cerebellar: Unable to perform    Medications:  Scheduled: .  B-complex with vitamin C  1 tablet Per Tube Daily  . chlorhexidine gluconate (MEDLINE KIT)  15 mL Mouth Rinse BID  . Chlorhexidine Gluconate Cloth  6 each Topical q morning - 10a   . darbepoetin (ARANESP) injection - DIALYSIS  60 mcg Intravenous Q Thu-HD  . feeding supplement (PRO-STAT SUGAR FREE 64)  30 mL Per Tube Daily  . hydrocortisone sod succinate (SOLU-CORTEF) inj  50 mg Intravenous Q6H  . mouth rinse  15 mL Mouth Rinse 10 times per day  . pantoprazole sodium  40 mg Per Tube BID  . rosuvastatin  40 mg Per Tube Daily  . sodium chloride flush  3 mL Intravenous Q12H   Continuous: .  prismasol BGK 4/2.5 500 mL/hr at 06/30/19 0753  .  prismasol BGK 4/2.5 400 mL/hr at 06/30/19 0612  . sodium chloride    . sodium chloride    . sodium chloride 10 mL/hr at 06/27/19 0922  . sodium chloride    . anidulafungin Stopped (07/16/2019 9381)  . ceFEPime (MAXIPIME) IV 2 g (06/24/2019 0911)  . dextrose 5 % and 0.45% NaCl    . feeding supplement (VITAL 1.5 CAL) 1,000 mL (06/30/2019 1154)  . insulin 8.5 Units/hr (06/30/2019 1027)  . norepinephrine (LEVOPHED) Adult infusion Stopped (July 02, 2019 0718)  . prismasol BGK 4/2.5 2,000 mL/hr at 06/30/19 0749  . vasopressin (PITRESSIN) infusion - *FOR SHOCK* Stopped (06/28/19 0536)    Pertinent Labs/Diagnostics: LDL 18  Echocardiogram showed left ventricular ejection fraction of 65 to 70% with no thrombus.    Carotid duplex showing bilateral ICAs with 1-39% stenosis.  MR BRAIN WO CONTRAST Result Date: 06/30/2019  IMPRESSION: 1. Early subacute left PICA territory infarct. 2. Numerous subcentimeter acute to early subacute infarcts in both cerebral hemispheres which may reflect watershed ischemia secondary to a hypoperfusion event. 3. 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 Electronically Signed   By: Logan Bores M.D.   On: 06/30/2019 12:41   DG Chest Port 1 View Result Date: 07/09/2019  IMPRESSION: Stable hardware positioning and bilateral pneumonia with small left effusion. Electronically Signed   By: Monte Fantasia M.D.   On: Jul 02, 2019 06:18   DG Chest Port 1 View Result Date: 06/30/2019  IMPRESSION: 1. Stable exam.  Diffuse interstitial and patchy alveolar opacity with small left pleural effusion. Electronically Signed   By: Misty Stanley M.D.   On: 06/30/2019 04:36     Etta Quill PA-C [facility] [phone]  Assessment: 79 year old male with multiple medical comorbidities including acute on chronic kidney injury requiring CRRT, bilateral DVTs, sepsis and ischemic bowel, whose MRI yesterday revealed a medium-sized early subacute left PICA territory infarct and numerous subcentimeter acute to early subacute infarcts in both cerebral hemispheres. 1. The subacute ischemic infarctions within the cerebral hemispheres may reflect watershed ischemia secondary to a hypoperfusion event. The subacute infarct in the left PICA territory appears more likely to be embolic.  2. While in hospital the patient has had multiple comorbidities contributing to altered mentation, including COVID-19 encephalopathy, uremia and sepsis.  3. Prior overnight LTM EEG showed no evidence of seizures.   4. Echocardiogram showed left ventricular ejection fraction of 65 to 70% with no thrombus.   5. Carotid duplex showed bilateral ICAs with 1-39% stenosis.   6. Currently patient remains intubated, breathing over the vent, with no responses to pain. However, he does have corneal reflexes, pupillary reflexes and a gag.reflex.   Impression:: -- Acute/subacute strokes -- Acute encephalopathy -- COVID-19 infection -- Acute on  chronic kidney injury requiring CRRT -- Bilateral DVTs -- Leukocytosis, shock, on antibiotics -- Patient was not a candidate for TPA and thrombectomy as last known normal was more than 24 hours ago -- Encephalopathy - multifactorial including stroke, uremia, possible infection, COVID-19 encephalopathy  -- LTM EEG overnight did not show any evidence of seizures.  Recommendations: -- Continue supportive care.  -- Initial stroke work up has been completed. May need TEE to assess for possible PFO as the  strokes seen on MRI could be secondary to paradoxical embolization.  -- Maintain BP.  -- The patient has been here since 06/14/2019 with no significant improvement. In the setting of multiple comorbidities affecting mentation as well as possible Covid encephalopathy, neurological prognosis is guarded.    Electronically signed: Dr. Kerney Elbe 07-07-19, 11:54 AM

## 2019-07-22 NOTE — Progress Notes (Signed)
NAME:  Steve Martinez, MRN:  [id number], DOB:  1941/06/14, LOS: 85 ADMISSION DATE:  06/05/2019, CONSULTATION DATE:  06/18/2019 REFERRING MD:  Dr. Candiss Norse, CHIEF COMPLAINT:  Hypotension  Brief History   79 yo male presented with weakness, dyspnea, hypoxia from COVID 19 pneumonia.  Course complicated by aspiration pneumonia, b/l leg DVT, and AKI.  Transferred to [facility] for iHD.  Developed septic shock and transferred to ICU.  Past Medical History  HTN, HLD, DMT2, GERD, CVA with left sided deficits/weakness, MI, CKD  Significant Hospital Events   12/23 Admitted [place] 12/29 transfer [facility]/ TDC placement 12/30 did not tolerated HD (became unresponsive), started on CRRT, tocilizumab x 1 01/02 Transfer out of ICU 01/04 PEA arrest on floor post HD, intubated and transferred back to ICU 01/05 concern for sepsis >> restart ABx 01/07 Blood in stool  Consults:  Nephrology 12/28 Neurology 1/05 >> s/o 1/08 Gastroenterology 1/07   Procedures:  12/29 Right TDC >> 1/4 ETT >  1/4  Lt IJ CVL >> 1/4 Rt radial A line >>   Significant Diagnostic Tests:  12/24 DVT BLE [place] >> acute DVT Rt peroneal vein; SVT Lt small saphenous vein 12/26 renal US >> chronic medical renal disease 12/27 TTE >> EF greater than 75% 1/5 CT head >>  Non hemorrhagic Lt PICA infarct acute/subacute, multiple remote lacunar infarcts in BS b/l, atrophy, chronic microvascular ischemia 1/5 EEG >> continuous rhythmic slowing 1/6 Echo >> EF 79 yo 70%  Micro Data:  12/23 SARS Coronavirus 2 >> POSITIVE 12/23 Blood >> Negative 1/5 respiratory >> Candida albicans 1/5 blood cultures >>   Antimicrobials:  Amp/Sulbactam 12/27>> Ceftriaxone 12/23 x 1 dose Doxycycline 12/23 x 1 dose Remdesivir 12/23>>12/27 Decadron 12/23>> x10 days Tocilizumab 12/30  X 1 Vancomycin 1/05 >> 1/09 Cefepime 1/05 >>  Eraxis 1/07 >>  Interim history/subjective:  Remains on vent, CRRT, pressors.  Objective   Blood pressure (!) 115/50, pulse 90, temperature  97.8 F (36.6 C), temperature source Oral, resp. rate (!) 32, height 6\' 1"  (1.854 m), weight 73.6 kg, SpO2 94 %. CVP:  [2 mmHg-15 mmHg] 2 mmHg  Vent Mode: PRVC FiO2 (%):  [50 %-60 %] 50 % Set Rate:  [30 bmp] 30 bmp Vt Set:  [450 mL] 450 mL PEEP:  [8 cmH20] 8 cmH20 Plateau Pressure:  [30 cmH20-34 cmH20] 30 cmH20   Intake/Output Summary (Last 24 hours) at 07/06/19 1044 Last data filed at July 06, 2019 0800 Gross per 24 hour  Intake 1893.43 ml  Output 0 ml  Net 1893.43 ml   Filed Weights   06/29/19 0409 06/30/19 0500 07-06-19 0449  Weight: 72 kg 72.5 kg 73.6 kg   Physical Exam:  General - unresponsive Eyes - roving eye movements ENT - ETT in place Cardiac - regular rate/rhythm, no murmur Chest - b/l rhonchi Abdomen - soft, non tender, + bowel sounds Extremities - decreased muscle bulk Skin - no rashes Neuro - no response to painful stimuli  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 - Maintain on full vent support for now - Discussed the topic of tracheostomy with the family but they want neuro prognosis prior to decision making which is reasonable - Adjust PEEP/FiO2 to keep SpO2 88 to 95%  Septic shock. - Pressors to keep MAP > 65 - Ttress steroids while on pressors - Day 7/7 cefepime - Day 4/7 eraxis  AKI from ATN. Metabolic acidosis. -  CRRT pre renal  Acute metabolic encephalopathy with acute CVA after PEA arrest with hx of CVA. - Monitor mental status off sedation; off since 06/26/19 - Brain MRI noted, neurology consult called  PEA cardiac arrest from hypoxia. Hx of CAD, HTN, HLD. - Monitor hemodynamics  B/l DVT. - Anticoagulation held in setting of GI bleed  GI bleeding. - Concern for ischemic colitis and gastritis - Continue protonix  Thrombocytopenia in setting of sepsis. - F/u CBC  DM type II. - SSI with levemir - Continue TF coverage  Goals of  care. - Prognosis for meaningful recovery seems poor - Will have neurology prognosticate then will discuss further with family  Son updated over the phone and all questions answered  Best practice:  Nutrition: tube feeds DVT prophylaxis: SCDs SUP: Protonix Mobility: bed rest Goals of care: full code Disposition: ICU  Labs:   CMP Latest Ref Rng & Units 2019-07-27 06/30/2019 06/30/2019  Glucose 70 - 99 mg/dL 361(H) 325(H) 282(H)  BUN 8 - 23 mg/dL 50(H) 42(H) 21  Creatinine 0.61 - 1.24 mg/dL 2.10(H) 1.78(H) 1.08  Sodium 135 - 145 mmol/L 137 137 135  Potassium 3.5 - 5.1 mmol/L 5.2(H) 4.8 4.2  Chloride 98 - 111 mmol/L 102 102 101  CO2 22 - 32 mmol/L 28 27 28   Calcium 8.9 - 10.3 mg/dL 6.9(L) 6.7(L) 6.4(LL)  Total Protein 6.5 - 8.1 g/dL - - -  Total Bilirubin 0.3 - 1.2 mg/dL - - -  Alkaline Phos 38 - 126 U/L - - -  AST 15 - 41 U/L - - -  ALT 0 - 44 U/L - - -    CBC Latest Ref Rng & Units 27-Jul-2019 July 27, 2019 06/30/2019  WBC 4.0 - 10.5 K/uL - 16.7(H) 21.0(H)  Hemoglobin 13.0 - 17.0 g/dL 7.7(L) 6.9(LL) 7.7(L)  Hematocrit 39.0 - 52.0 % 24.6(L) 21.7(L) 23.4(L)  Platelets 150 - 400 K/uL - 31(L) 35(L)    ABG    Component Value Date/Time   PHART 7.375 06/29/2019 0209   PCO2ART 48.5 (H) 06/29/2019 0209   PO2ART 60.0 (L) 06/29/2019 0209   HCO3 28.5 (H) 06/29/2019 0209   TCO2 30 06/29/2019 0209   ACIDBASEDEF 4.0 (H) 06/28/2019 0119   O2SAT 90.0 06/29/2019 0209    CBG (last 3)  Recent Labs    27-Jul-2019 0832 07-27-19 0926 2019-07-27 1026  GLUCAP 273* 312* 244*   The patient is critically ill with multiple organ systems failure and requires high complexity decision making for assessment and support, frequent evaluation and titration of therapies, application of advanced monitoring technologies and extensive interpretation of multiple databases.   Critical Care Time devoted to patient care services described in this note is  32  Minutes. This time reflects time of care of this signee  Dr Jennet Maduro. This critical care time does not reflect procedure time, or teaching time or supervisory time of PA/NP/Med student/Med Resident etc but could involve care discussion time.  Rush Farmer, M.D. [facility] Pulmonary/Critical Care Medicine.

## 2019-07-22 NOTE — Progress Notes (Signed)
Called and updated son regarding cardiac arrest. He is going to talk with patient wife regarding how far they want to push this. Remains full code for now.

## 2019-07-22 NOTE — Progress Notes (Signed)
[place] Progress Note Patient Name: Steve Martinez DOB: 04-Oct-1940 MRN: [id number]   Date of Service  07-15-2019  HPI/Events of Note  Hgb drop from 7.7 to 6.9.  Thrombocytopenia to 31.  eICU Interventions  Will transfuse 1u PRBCs and recheck CBC post-tx.     Intervention Category Intermediate Interventions: Bleeding - evaluation and treatment with blood products  Marily Lente Joana Nolton July 15, 2019, 4:24 AM

## 2019-07-22 NOTE — Progress Notes (Signed)
[name] Progress Note Patient Name: Steve Martinez DOB: [birth year] MRN: [id number]   Date of Service  Jul 23, 2019  HPI/Events of Note  Pt with loose stools  eICU Interventions  Flexiseal ordered.        Kerry Kass Harmonie Verrastro 07-23-2019, 8:55 PM

## 2019-07-22 NOTE — Progress Notes (Signed)
Pts son called back to the unit asking to speak RN. Gave an update about pts current status. He stated "I would like to move forward with comfort care."  I confirmed with pts that he would like to transition to comfort care and what that would mean. Pts son and pts wife are on his way to the hospital. I informed him while family is in transit to the hospital this RN would speak to the MD and get a pain medication started so the pt would be comfortable. After family was at bedside we would stop all other medications. Family confirmed this.  Called and spoke to [facility] and aware of family wanting to proceed with comfort care.

## 2019-07-22 NOTE — Plan of Care (Signed)
  Problem: Respiratory: Goal: Will maintain a patent airway Outcome: Progressing   Problem: Nutrition: Goal: Adequate nutrition will be maintained Outcome: Progressing Note: Pt is tolerating tube feed at goal well.   Problem: Elimination: Goal: Will not experience complications related to bowel motility Outcome: Progressing Note: Last BM 2019-07-13   Problem: Pain Managment: Goal: General experience of comfort will improve Outcome: Progressing   Problem: Skin Integrity: Goal: Risk for impaired skin integrity will decrease Outcome: Progressing   Problem: Activity: Goal: Risk for activity intolerance will decrease Outcome: Not Progressing Note: Pt is not interactive. Unable to mobilize patient. Continues to bed bound due to critical illness.

## 2019-07-22 DEATH — deceased

## 2019-07-23 ENCOUNTER — Encounter (HOSPITAL_COMMUNITY): Payer: Self-pay | Admitting: Internal Medicine

## 2020-03-09 IMAGING — DX DG CHEST 1V PORT
1 series · 1 of 1 positions shown · non-contrast
Comparison: Chest x-ray 02/03/2012, 06/16/2005.

CLINICAL DATA: Cough.  Shortness of breath.  Fever.

EXAM:
PORTABLE CHEST 1 VIEW

[chest ap]
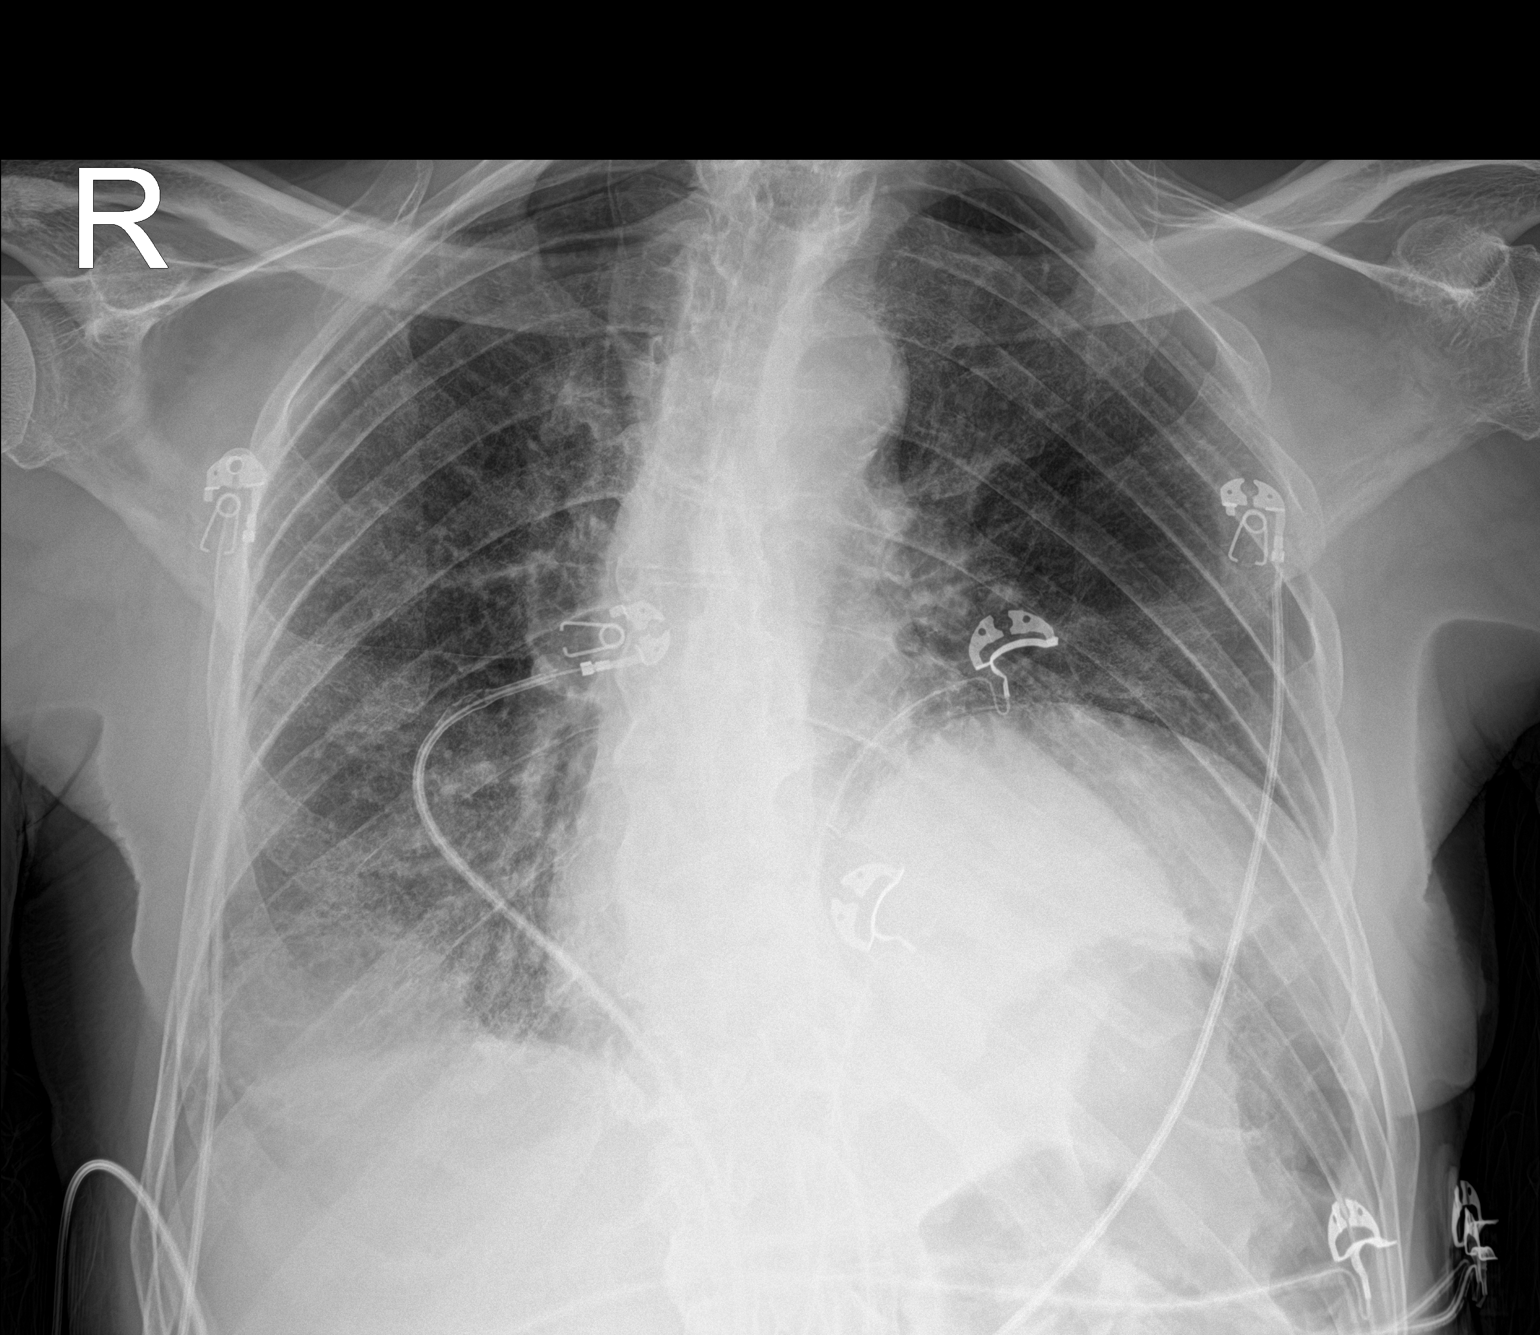

[1 of 1 positions shown; findings below may reference images not displayed]

FINDINGS: Mediastinum hilar structures normal. Heart size normal. Diffuse
bilateral interstitial prominence. Pneumonitis could present this
fashion. Bibasilar atelectasis with right base alveolar infiltrate.
No prominent pleural effusion or pneumothorax. Stable elevation left
hemidiaphragm.
IMPRESSION: 1. Diffuse bilateral interstitial prominence. Pneumonitis could
present this fashion. Bibasilar atelectasis with right base alveolar
infiltrate.

2.  Chronic elevation left hemidiaphragm.

## 2020-03-17 IMAGING — RF DG ABDOMEN 1V
1 series · 1 of 1 positions shown · non-contrast
Comparison: June 18, 2019

CLINICAL DATA: Feeding tube placement

EXAM:
ABDOMEN - 1 VIEW

[Series 1: cp_standard · 0.25mm/px · 1 of 1 slices shown]
[im 1/1]
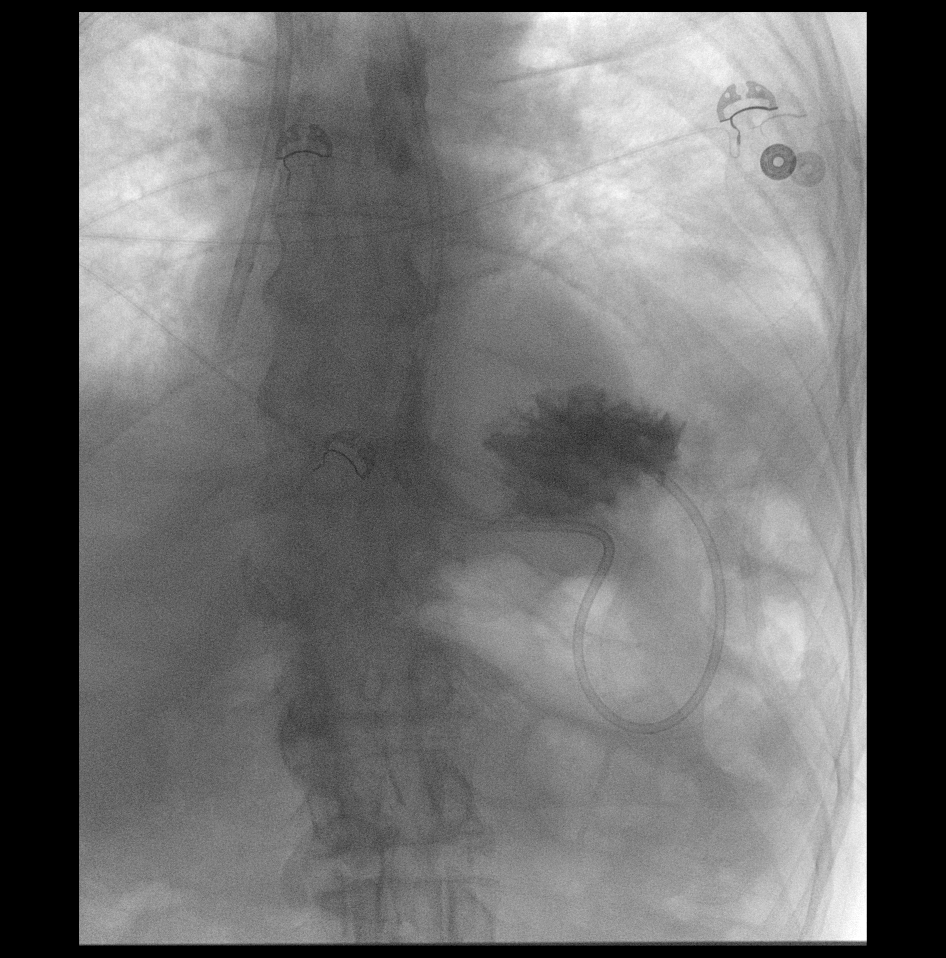

[1 of 1 positions shown; findings below may reference images not displayed]

FINDINGS: The tip of the enteric tube projects over the gastric body/fundus.
Oral contrast is noted in the gastric lumen. A central venous
catheter is noted projecting over the right atrium. The total
fluoroscopy time for the study was 3 minutes and 42 seconds. A total
of 10 mL of oral contrast was administered through the tube.
IMPRESSION: Enteric tube as above.

## 2020-03-19 IMAGING — DX DG ABD PORTABLE 1V
1 series · 1 of 1 positions shown · non-contrast
Comparison: One day prior and back to 06/18/2019.

CLINICAL DATA: Ileus.  CGCMD-Q7 positive.

EXAM:
PORTABLE ABDOMEN - 1 VIEW

[abdomen kub]
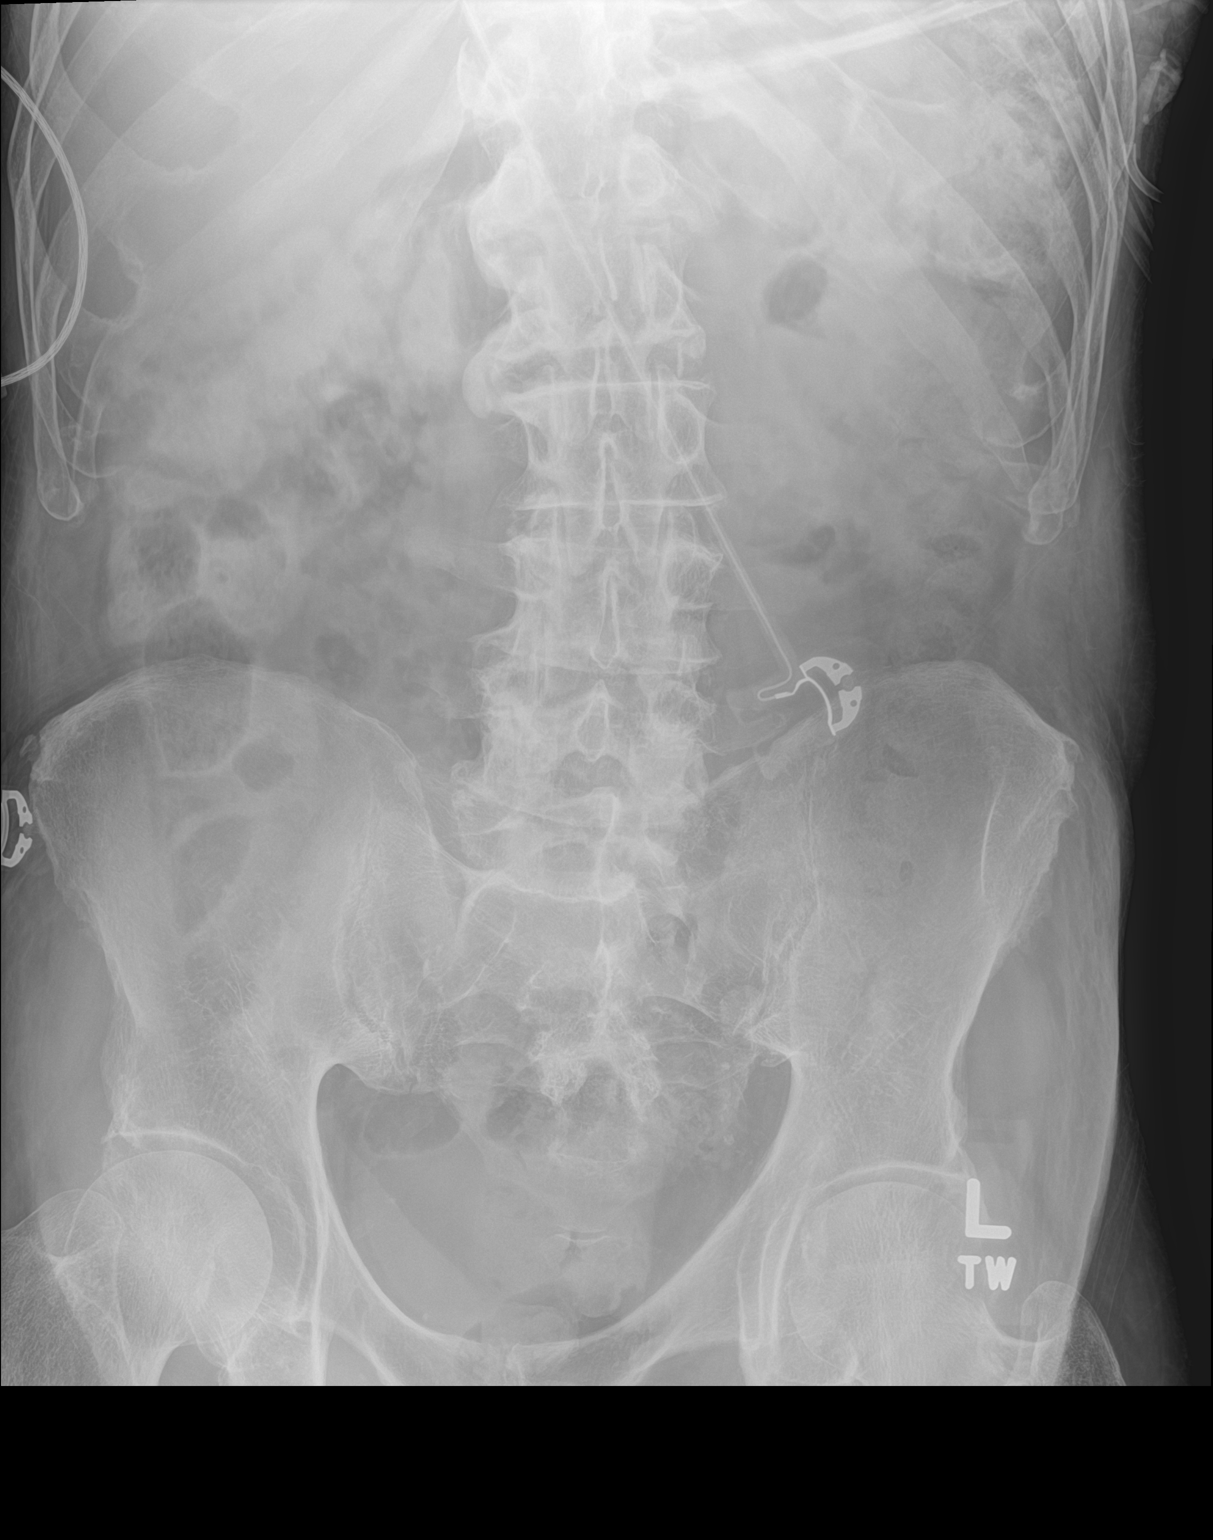

[1 of 1 positions shown; findings below may reference images not displayed]

FINDINGS: Single supine view of the abdomen and pelvis. Resolution of
previously described gas filled small bowel loops. Bowel gas pattern
currently within normal limits. No abnormal abdominal
calcifications. No appendicolith. Distal gas.
IMPRESSION: No acute findings.  Resolved adynamic ileus.

## 2020-03-21 IMAGING — DX DG CHEST 1V PORT
1 series · 1 of 1 positions shown · non-contrast
Comparison: June 17, 2019

CLINICAL DATA: Hypoxia.

EXAM:
PORTABLE CHEST 1 VIEW

[chest ap]
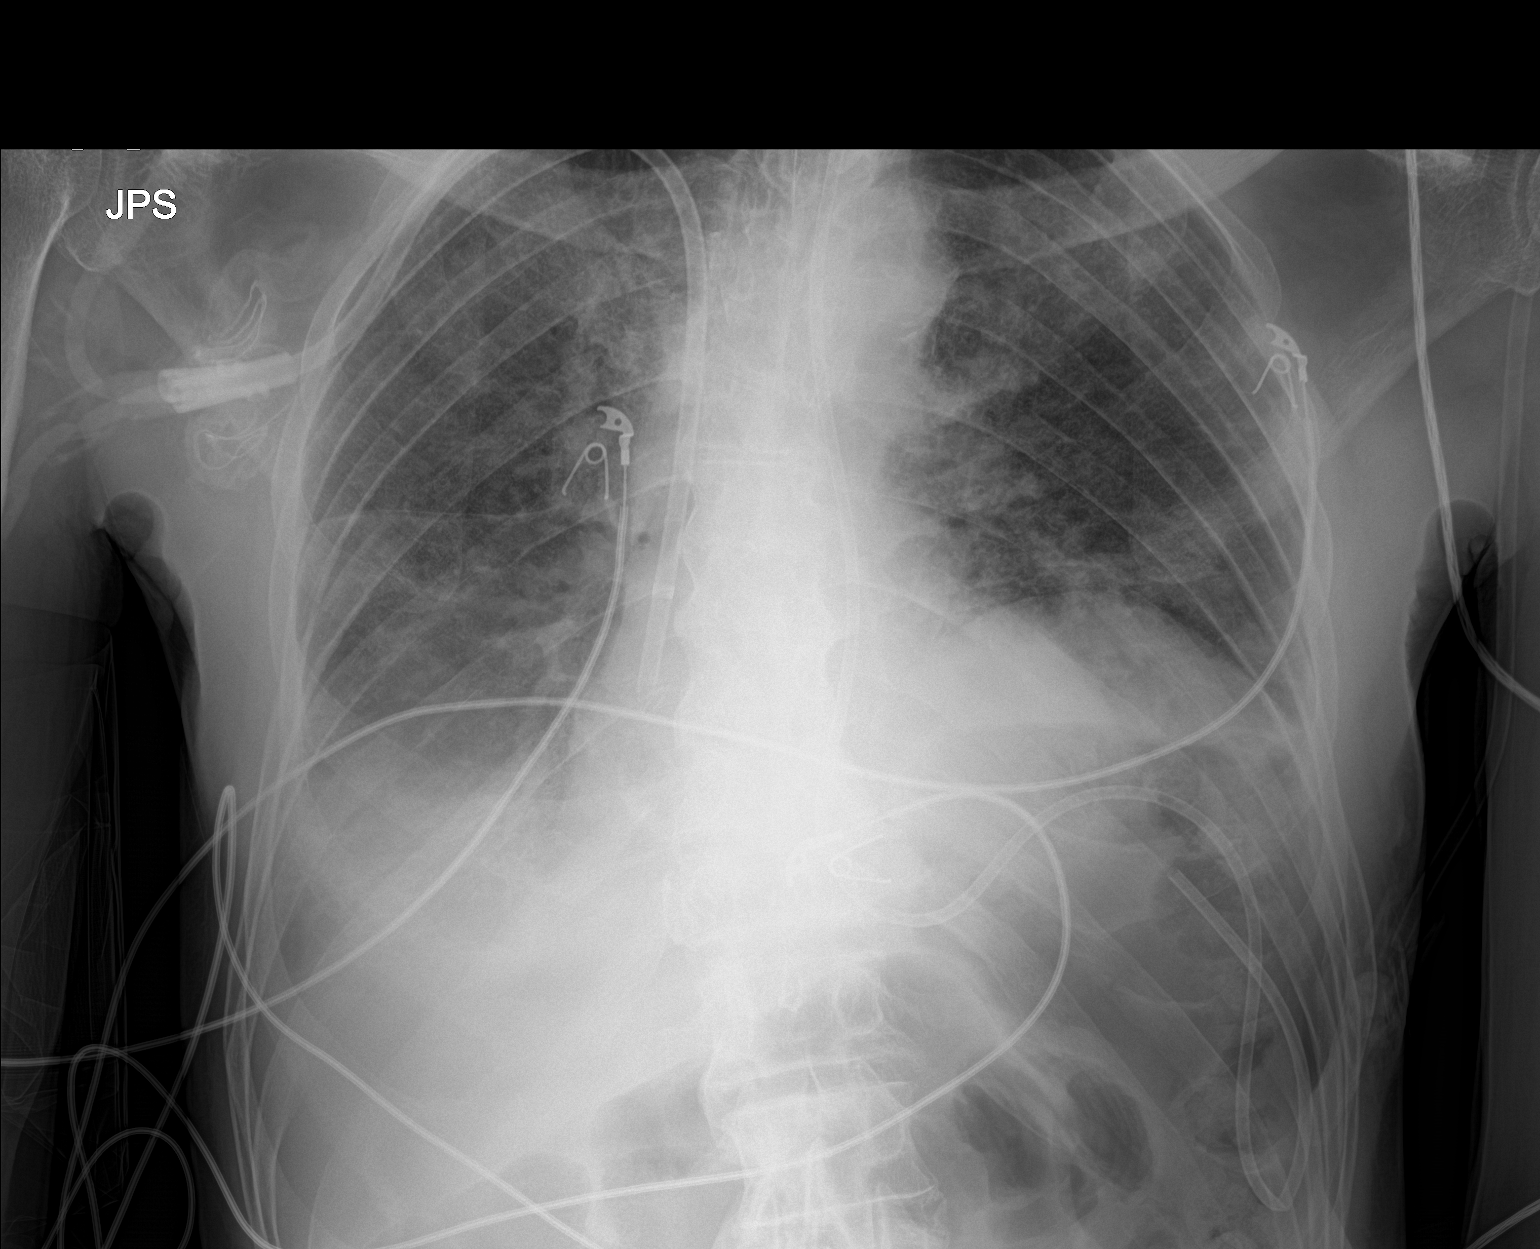

[1 of 1 positions shown; findings below may reference images not displayed]

FINDINGS: Endotracheal tube tip is 3.8 cm above the carina. Feeding tube tip
is in the stomach. Central catheter tip is at the cavoatrial
junction. No pneumothorax. There is a right pleural effusion. There
is bibasilar atelectatic change. There is less patchy airspace
opacity bilaterally compared to the previous study with residual
patchy airspace opacity each upper lobe and left lower lobe. Heart
is upper normal in size with pulmonary vascularity normal. There is
aortic atherosclerosis. No bone lesions.
IMPRESSION: Tube and catheter positions as described without evident
pneumothorax. Partial but incomplete clearing of airspace opacity
bilaterally. Residual airspace opacity noted in each upper lobe and
left lower lobe with areas of atelectatic change in the lower lung
zones as well. No evident new opacity. Small right pleural effusion
evident. Stable cardiac silhouette. Aortic Atherosclerosis
([id number]).

## 2020-03-21 IMAGING — DX DG CHEST 1V PORT
1 series · 1 of 1 positions shown · non-contrast
Comparison: Single-view of the chest earlier today.

CLINICAL DATA: Status post central line placement today.

EXAM:
PORTABLE CHEST 1 VIEW

[chest ap]
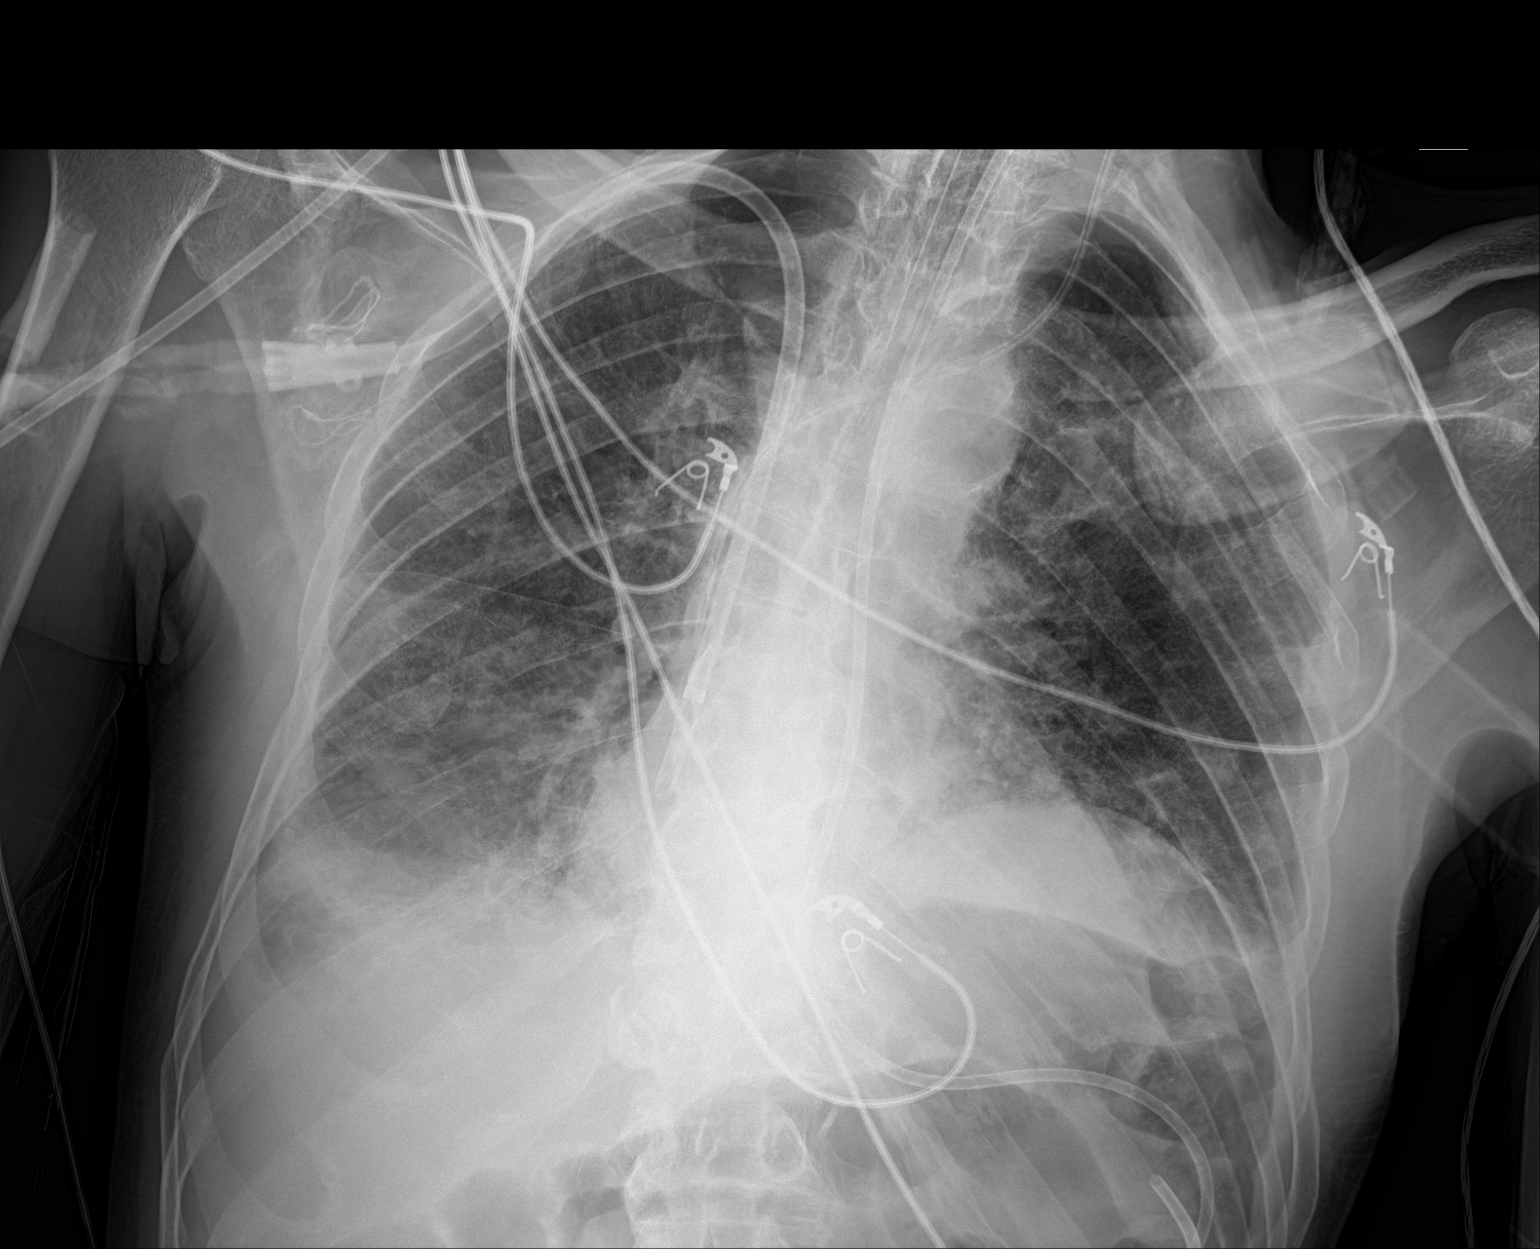

[1 of 1 positions shown; findings below may reference images not displayed]

FINDINGS: The patient has a new left IJ approach central venous catheter with
the tip in the lower superior vena cava. Support apparatus is
otherwise unchanged. No pneumothorax. Right worse than left airspace
disease and small pleural effusions are also unchanged.
IMPRESSION: Tip of new left IJ catheter projects in the lower superior vena
cava. No pneumothorax or other change since the exam earlier today.
